# Patient Record
Sex: Female | Born: 1990
Health system: Southern US, Community
[De-identification: ages and names within clinical notes are randomized; demographics above are authoritative.]

## PROBLEM LIST (undated history)

## (undated) DIAGNOSIS — F329 Major depressive disorder, single episode, unspecified: Secondary | ICD-10-CM

## (undated) DIAGNOSIS — Z5181 Encounter for therapeutic drug level monitoring: Secondary | ICD-10-CM

## (undated) DIAGNOSIS — F32A Depression, unspecified: Secondary | ICD-10-CM

## (undated) DIAGNOSIS — Z8619 Personal history of other infectious and parasitic diseases: Secondary | ICD-10-CM

## (undated) DIAGNOSIS — I5021 Acute systolic (congestive) heart failure: Secondary | ICD-10-CM

## (undated) DIAGNOSIS — R87629 Unspecified abnormal cytological findings in specimens from vagina: Secondary | ICD-10-CM

## (undated) DIAGNOSIS — F419 Anxiety disorder, unspecified: Secondary | ICD-10-CM

## (undated) DIAGNOSIS — I10 Essential (primary) hypertension: Secondary | ICD-10-CM

## (undated) DIAGNOSIS — J189 Pneumonia, unspecified organism: Secondary | ICD-10-CM

## (undated) DIAGNOSIS — I509 Heart failure, unspecified: Secondary | ICD-10-CM

## (undated) DIAGNOSIS — J45909 Unspecified asthma, uncomplicated: Secondary | ICD-10-CM

## (undated) DIAGNOSIS — F319 Bipolar disorder, unspecified: Secondary | ICD-10-CM

## (undated) DIAGNOSIS — I236 Thrombosis of atrium, auricular appendage, and ventricle as current complications following acute myocardial infarction: Secondary | ICD-10-CM

## (undated) DIAGNOSIS — Z8659 Personal history of other mental and behavioral disorders: Secondary | ICD-10-CM

## (undated) DIAGNOSIS — Z7901 Long term (current) use of anticoagulants: Secondary | ICD-10-CM

## (undated) DIAGNOSIS — I2542 Coronary artery dissection: Secondary | ICD-10-CM

## (undated) HISTORY — DX: Personal history of other mental and behavioral disorders: Z86.59

## (undated) HISTORY — DX: Personal history of other infectious and parasitic diseases: Z86.19

## (undated) HISTORY — DX: Essential (primary) hypertension: I10

---
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
## 2008-04-22 ENCOUNTER — Emergency Department (HOSPITAL_COMMUNITY): Admission: EM | Admit: 2008-04-22 | Discharge: 2008-04-23 | Payer: Self-pay | Admitting: Emergency Medicine

## 2010-04-07 ENCOUNTER — Ambulatory Visit (HOSPITAL_COMMUNITY): Admission: RE | Admit: 2010-04-07 | Discharge: 2010-04-07 | Payer: Self-pay | Admitting: Obstetrics and Gynecology

## 2010-06-22 ENCOUNTER — Inpatient Hospital Stay (HOSPITAL_COMMUNITY)
Admission: AD | Admit: 2010-06-22 | Discharge: 2010-06-25 | Payer: Self-pay | Source: Home / Self Care | Attending: Obstetrics and Gynecology | Admitting: Obstetrics and Gynecology

## 2010-09-14 LAB — COMPREHENSIVE METABOLIC PANEL
AST: 25 U/L (ref 0–37)
Albumin: 3.4 g/dL — ABNORMAL LOW (ref 3.5–5.2)
Alkaline Phosphatase: 113 U/L (ref 39–117)
BUN: 4 mg/dL — ABNORMAL LOW (ref 6–23)
BUN: 6 mg/dL (ref 6–23)
CO2: 23 mEq/L (ref 19–32)
Calcium: 8.3 mg/dL — ABNORMAL LOW (ref 8.4–10.5)
Creatinine, Ser: 0.72 mg/dL (ref 0.4–1.2)
GFR calc Af Amer: >60 mL/min (ref >60)
GFR calc non Af Amer: >60 mL/min (ref >60)
Glucose, Bld: 81 mg/dL (ref 70–99)
Potassium: 4.1 mEq/L (ref 3.5–5.1)
Total Protein: 6.3 g/dL (ref 6.0–8.3)

## 2010-09-14 LAB — LACTATE DEHYDROGENASE: LDH: 178 U/L (ref 94–250)

## 2010-09-14 LAB — CBC
HCT: 31.2 % — ABNORMAL LOW (ref 36.0–46.0)
HCT: 38.1 % (ref 36.0–46.0)
Hemoglobin: 10.7 g/dL — ABNORMAL LOW (ref 12.0–15.0)
MCH: 30.1 pg (ref 26.0–34.0)
MCHC: 34.3 g/dL (ref 30.0–36.0)
MCV: 85.8 fL (ref 78.0–100.0)
MCV: 87 fL (ref 78.0–100.0)
Platelets: 230 10*3/uL (ref 150–400)
RBC: 4.38 MIL/uL (ref 3.87–5.11)
RDW: 13 % (ref 11.5–15.5)
RDW: 13.3 % (ref 11.5–15.5)
WBC: 6.4 10*3/uL (ref 4.0–10.5)
WBC: 7.3 10*3/uL (ref 4.0–10.5)

## 2010-09-14 LAB — URIC ACID: Uric Acid, Serum: 6.5 mg/dL (ref 2.4–7.0)

## 2010-09-25 ENCOUNTER — Encounter: Payer: Self-pay | Admitting: Hematology and Oncology

## 2012-08-03 DIAGNOSIS — J45909 Unspecified asthma, uncomplicated: Secondary | ICD-10-CM | POA: Diagnosis present

## 2012-08-04 ENCOUNTER — Emergency Department (HOSPITAL_COMMUNITY): Admission: EM | Admit: 2012-08-04 | Discharge: 2012-08-04 | Discharge status: Absent without leave | Payer: Self-pay

## 2012-10-02 DIAGNOSIS — F32A Depression, unspecified: Secondary | ICD-10-CM | POA: Diagnosis present

## 2013-03-17 ENCOUNTER — Emergency Department (HOSPITAL_COMMUNITY)
Admission: EM | Admit: 2013-03-17 | Discharge: 2013-03-17 | Payer: PRIVATE HEALTH INSURANCE | Attending: Emergency Medicine | Admitting: Emergency Medicine

## 2013-03-17 ENCOUNTER — Encounter (HOSPITAL_COMMUNITY): Payer: Self-pay

## 2013-03-17 DIAGNOSIS — J45909 Unspecified asthma, uncomplicated: Secondary | ICD-10-CM | POA: Insufficient documentation

## 2013-03-17 DIAGNOSIS — R45851 Suicidal ideations: Secondary | ICD-10-CM

## 2013-03-17 DIAGNOSIS — Z3202 Encounter for pregnancy test, result negative: Secondary | ICD-10-CM | POA: Insufficient documentation

## 2013-03-17 DIAGNOSIS — Z79899 Other long term (current) drug therapy: Secondary | ICD-10-CM | POA: Insufficient documentation

## 2013-03-17 HISTORY — DX: Unspecified asthma, uncomplicated: J45.909

## 2013-03-17 LAB — POCT I-STAT, CHEM 8
Calcium, Ion: 1.2 mmol/L (ref 1.12–1.23)
Chloride: 105 mEq/L (ref 96–112)
HCT: 48 % — ABNORMAL HIGH (ref 36.0–46.0)
Hemoglobin: 16.3 g/dL — ABNORMAL HIGH (ref 12.0–15.0)
TCO2: 24 mmol/L (ref 0–100)

## 2013-03-17 LAB — CBC WITH DIFFERENTIAL/PLATELET
Basophils Absolute: 0 10*3/uL (ref 0.0–0.1)
Basophils Relative: 0 % (ref 0–1)
Eosinophils Absolute: 0.1 10*3/uL (ref 0.0–0.7)
Eosinophils Relative: 1 % (ref 0–5)
HCT: 42.3 % (ref 36.0–46.0)
Hemoglobin: 15.5 g/dL — ABNORMAL HIGH (ref 12.0–15.0)
Lymphocytes Relative: 23 % (ref 12–46)
Lymphs Abs: 1.5 10*3/uL (ref 0.7–4.0)
MCH: 31.1 pg (ref 26.0–34.0)
MCHC: 36.6 g/dL — ABNORMAL HIGH (ref 30.0–36.0)
MCV: 84.9 fL (ref 78.0–100.0)
Monocytes Absolute: 0.3 10*3/uL (ref 0.1–1.0)
Monocytes Relative: 5 % (ref 3–12)
Neutro Abs: 4.7 10*3/uL (ref 1.7–7.7)
Neutrophils Relative %: 71 % (ref 43–77)
Platelets: 282 10*3/uL (ref 150–400)
RBC: 4.98 MIL/uL (ref 3.87–5.11)
RDW: 12 % (ref 11.5–15.5)
WBC: 6.6 10*3/uL (ref 4.0–10.5)

## 2013-03-17 LAB — URINALYSIS, ROUTINE W REFLEX MICROSCOPIC
Ketones, ur: >80 mg/dL — AB
Nitrite: NEGATIVE
pH: 6.5 (ref 5.0–8.0)

## 2013-03-17 LAB — URINE MICROSCOPIC-ADD ON

## 2013-03-17 LAB — RAPID URINE DRUG SCREEN, HOSP PERFORMED
Amphetamines: NOT DETECTED
Barbiturates: NOT DETECTED
Benzodiazepines: NOT DETECTED
Cocaine: NOT DETECTED
Opiates: NOT DETECTED
Tetrahydrocannabinol: NOT DETECTED

## 2013-03-17 LAB — POCT PREGNANCY, URINE: Preg Test, Ur: NEGATIVE

## 2013-03-17 LAB — ETHANOL: Alcohol, Ethyl (B): <11 mg/dL (ref 0–11)

## 2013-03-17 NOTE — ED Notes (Addendum)
Per patient she had some "very life changing news" yesterday and she went to [facility] where she was told to stay the night. She is experienced an "anxiety attack" earlier due to where her daughter is having to stay while she is at [facility]. Patient denies pain at this time.

## 2013-03-17 NOTE — ED Notes (Signed)
Pt refusing to take d/c papers, states that she was told that she would get to "talk to someone on the TV" about going home. Erin,PA notified and at bedside talking to pt.

## 2013-03-17 NOTE — ED Notes (Addendum)
Per EMS patient is from [facility], EMS was called by nurse for patient stating that she was having breathing difficulty. EMS assessed patient to be clear in all lung fields, 100% O2 saturation. Noted contraction of hands. Patient experiencing numbness around the lips and face. GCS 15.

## 2013-03-17 NOTE — ED Provider Notes (Signed)
Medical screening examination/treatment/procedure(s) were performed by non-physician practitioner and as supervising physician I was immediately available for consultation/collaboration.   Glynn Octave, MD 03/17/13 [phone]

## 2013-03-17 NOTE — ED Provider Notes (Signed)
CSN: [id number]     Arrival date & time 03/17/13  1238 History   First MD Initiated Contact with Patient 03/17/13 1244     Chief Complaint  Patient presents with  . Anxiety   (Consider location/radiation/quality/duration/timing/severity/associated sxs/prior Treatment) HPI Comments: Patient here from [facility] for medical clearance.  She already has IVC papers.  She states that she has been seeing a therapist on and off for the past 4 years since her mother died.  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  She states that she has felt persecuted by this person and on Friday was told that she would be moved to a different school.  She states that this left her frustrated and confused so last night she showed up at [facility], "just to talk with someone".  She states that during this conversation she did mention the frustration made her want "to shoot herself", but that she was only using this as a figure of speech and that she would never actually do anything.  She reports no previous suicide attempts, states that she has never been on any psychiatric medication and that this thing has been blown way out of proportion.  She adamantly denies suicidal or homicidal ideation at this time.  She reports no other medical history.  She does mention that her PCP once after her mother died had tried to place her on Prozac but she states that she took it for only 3 days then stopped.  Patient is a 22 y.o. female presenting with anxiety. The history is provided by the patient and medical records. No language interpreter was used.  Anxiety This is a new problem. The current episode started in the past 7 days. The problem occurs constantly. The problem has been resolved. Pertinent negatives include no abdominal pain, anorexia, arthralgias, chest pain, chills, coughing, diaphoresis, fatigue, fever, headaches, myalgias, nausea, numbness, rash, sore throat,  urinary symptoms, visual change, vomiting or weakness. Nothing aggravates the symptoms. She has tried nothing for the symptoms. The treatment provided no relief.    Past Medical History  Diagnosis Date  . Asthma    History reviewed. No pertinent past surgical history. No family history on file. History  Substance Use Topics  . Smoking status: Never Smoker   . Smokeless tobacco: Not on file  . Alcohol Use: No   OB History   Grav Para Term Preterm Abortions TAB SAB Ect Mult Living                 Review of Systems  Constitutional: Negative for fever, chills, diaphoresis and fatigue.  HENT: Negative for sore throat.   Respiratory: Negative for cough.   Cardiovascular: Negative for chest pain.  Gastrointestinal: Negative for nausea, vomiting, abdominal pain and anorexia.  Musculoskeletal: Negative for myalgias and arthralgias.  Skin: Negative for rash.  Neurological: Negative for weakness, numbness and headaches.  All other systems reviewed and are negative.    Allergies  Gluten meal  Home Medications   Current Outpatient Rx  Name  Route  Sig  Dispense  Refill  . albuterol (PROVENTIL HFA;VENTOLIN HFA) 108 (90 BASE) MCG/ACT inhaler   Inhalation   Inhale 2 puffs into the lungs every 6 (six) hours as needed for wheezing.         . beclomethasone (QVAR) 80 MCG/ACT inhaler   Inhalation   Inhale 1 puff into the lungs 2 (two) times  daily.         . ibuprofen (ADVIL,MOTRIN) 200 MG tablet   Oral   Take 600 mg by mouth every 6 (six) hours as needed for pain.          BP 149/78  Pulse 90  Temp(Src) 98 F (36.7 C) (Oral)  Resp 14  SpO2 100%  LMP 02/26/2013 Physical Exam  Nursing note and vitals reviewed. Constitutional: She is oriented to person, place, and time. She appears well-developed and well-nourished. No distress.  HENT:  Head: Normocephalic and atraumatic.  Right Ear: External ear normal.  Left Ear: External ear normal.  Nose: Nose normal.   Mouth/Throat: Oropharynx is clear and moist. No oropharyngeal exudate.  Eyes: Conjunctivae are normal. Pupils are equal, round, and reactive to light. No scleral icterus.  Neck: Normal range of motion. Neck supple.  Cardiovascular: Normal rate, regular rhythm and normal heart sounds.  Exam reveals no gallop and no friction rub.   No murmur heard. Pulmonary/Chest: Effort normal and breath sounds normal. No respiratory distress. She has no wheezes. She exhibits no tenderness.  Abdominal: Soft. Bowel sounds are normal. There is no tenderness.  Musculoskeletal: Normal range of motion. She exhibits no edema and no tenderness.  Lymphadenopathy:    She has no cervical adenopathy.  Neurological: She is alert and oriented to person, place, and time. No cranial nerve deficit.  Skin: Skin is warm and dry. No rash noted. No erythema. No pallor.  Psychiatric: She has a normal mood and affect. Her speech is normal and behavior is normal. Judgment normal. Thought content is not delusional. Cognition and memory are normal. She expresses no homicidal and no suicidal ideation. She expresses no suicidal plans and no homicidal plans.    ED Course  Procedures (including critical care time) Labs Review Labs Reviewed  URINALYSIS, ROUTINE W REFLEX MICROSCOPIC - Abnormal; Notable for the following:    Color, Urine AMBER (*)    APPearance CLOUDY (*)    Specific Gravity, Urine 1.035 (*)    Ketones, ur >80 (*)    Protein, ur 30 (*)    Leukocytes, UA SMALL (*)    All other components within normal limits  URINE MICROSCOPIC-ADD ON - Abnormal; Notable for the following:    Squamous Epithelial / LPF FEW (*)    All other components within normal limits  URINE RAPID DRUG SCREEN (HOSP PERFORMED)  CBC WITH DIFFERENTIAL  ETHANOL  POCT PREGNANCY, URINE   Results for orders placed during the hospital encounter of 03/17/13  CBC WITH DIFFERENTIAL      Result Value Range   WBC 6.6  4.0 - 10.5 K/uL   RBC 4.98  3.87 -  5.11 MIL/uL   Hemoglobin 15.5 (*) 12.0 - 15.0 g/dL   HCT 29.5  28.4 - 13.2 %   MCV 84.9  78.0 - 100.0 fL   MCH 31.1  26.0 - 34.0 pg   MCHC 36.6 (*) 30.0 - 36.0 g/dL   RDW 44.0  10.2 - 72.5 %   Platelets 282  150 - 400 K/uL   Neutrophils Relative % PENDING  43 - 77 %   Neutro Abs PENDING  1.7 - 7.7 K/uL   Band Neutrophils PENDING  0 - 10 %   Lymphocytes Relative PENDING  12 - 46 %   Lymphs Abs PENDING  0.7 - 4.0 K/uL   Monocytes Relative PENDING  3 - 12 %   Monocytes Absolute PENDING  0.1 - 1.0 K/uL   Eosinophils  Relative PENDING  0 - 5 %   Eosinophils Absolute PENDING  0.0 - 0.7 K/uL   Basophils Relative PENDING  0 - 1 %   Basophils Absolute PENDING  0.0 - 0.1 K/uL   WBC Morphology PENDING     RBC Morphology PENDING     Smear Review PENDING     nRBC PENDING  0 /100 WBC   Metamyelocytes Relative PENDING     Myelocytes PENDING     Promyelocytes Absolute PENDING     Blasts PENDING    URINALYSIS, ROUTINE W REFLEX MICROSCOPIC      Result Value Range   Color, Urine AMBER (*) YELLOW   APPearance CLOUDY (*) CLEAR   Specific Gravity, Urine 1.035 (*) 1.005 - 1.030   pH 6.5  5.0 - 8.0   Glucose, UA NEGATIVE  NEGATIVE mg/dL   Hgb urine dipstick NEGATIVE  NEGATIVE   Bilirubin Urine NEGATIVE  NEGATIVE   Ketones, ur >80 (*) NEGATIVE mg/dL   Protein, ur 30 (*) NEGATIVE mg/dL   Urobilinogen, UA 1.0  0.0 - 1.0 mg/dL   Nitrite NEGATIVE  NEGATIVE   Leukocytes, UA SMALL (*) NEGATIVE  ETHANOL      Result Value Range   Alcohol, Ethyl (B) <11  0 - 11 mg/dL  URINE RAPID DRUG SCREEN (HOSP PERFORMED)      Result Value Range   Opiates NONE DETECTED  NONE DETECTED   Cocaine NONE DETECTED  NONE DETECTED   Benzodiazepines NONE DETECTED  NONE DETECTED   Amphetamines NONE DETECTED  NONE DETECTED   Tetrahydrocannabinol NONE DETECTED  NONE DETECTED   Barbiturates NONE DETECTED  NONE DETECTED  URINE MICROSCOPIC-ADD ON      Result Value Range   Squamous Epithelial / LPF FEW (*) RARE   WBC, UA 7-10   <3 WBC/hpf   Urine-Other MUCOUS PRESENT    POCT PREGNANCY, URINE      Result Value Range   Preg Test, Ur NEGATIVE  NEGATIVE  POCT I-STAT, CHEM 8      Result Value Range   Sodium 142  135 - 145 mEq/L   Potassium 3.5  3.5 - 5.1 mEq/L   Chloride 105  96 - 112 mEq/L   BUN 16  6 - 23 mg/dL   Creatinine, Ser 4.09  0.50 - 1.10 mg/dL   Glucose, Bld 75  70 - 99 mg/dL   Calcium, Ion 8.11  1.12 - 1.23 mmol/L   TCO2 24  0 - 100 mmol/L   Hemoglobin 16.3 (*) 12.0 - 15.0 g/dL   HCT 91.4 (*) 78.2 - 95.6 %   No results found.   Imaging Review No results found.  MDM  IVC - for suicidal ideation  Patient here with 72 hour hold for medical clearance from [facility], though based on the patient's story I feel this is a light admit there, we will return her to [facility] as she has been medically cleared.     Izola Price Marisue Humble, [place] 03/17/13 1533

## 2013-03-17 NOTE — ED Notes (Signed)
Bed: WU13 Expected date: 03/17/13 Expected time: 12:33 PM Means of arrival: Ambulance Comments: Pt from [facility] Asthma vs anxiety to return to [facility]

## 2013-10-16 DIAGNOSIS — Z8659 Personal history of other mental and behavioral disorders: Secondary | ICD-10-CM

## 2014-07-05 ENCOUNTER — Encounter (HOSPITAL_COMMUNITY): Payer: Self-pay | Admitting: Emergency Medicine

## 2014-07-05 ENCOUNTER — Emergency Department (HOSPITAL_COMMUNITY)
Admission: EM | Admit: 2014-07-05 | Discharge: 2014-07-06 | Disposition: A | Discharge status: Home / Self Care | Payer: PRIVATE HEALTH INSURANCE | Attending: Emergency Medicine | Admitting: Emergency Medicine

## 2014-07-05 DIAGNOSIS — F32A Depression, unspecified: Secondary | ICD-10-CM

## 2014-07-05 DIAGNOSIS — J45909 Unspecified asthma, uncomplicated: Secondary | ICD-10-CM | POA: Insufficient documentation

## 2014-07-05 DIAGNOSIS — Z79899 Other long term (current) drug therapy: Secondary | ICD-10-CM | POA: Insufficient documentation

## 2014-07-05 DIAGNOSIS — F329 Major depressive disorder, single episode, unspecified: Secondary | ICD-10-CM | POA: Insufficient documentation

## 2014-07-05 DIAGNOSIS — Z7951 Long term (current) use of inhaled steroids: Secondary | ICD-10-CM | POA: Insufficient documentation

## 2014-07-05 HISTORY — DX: Bipolar disorder, unspecified: F31.9

## 2014-07-05 NOTE — ED Notes (Signed)
Pt reports that she has a hx of Bipolar disorder, has been non-complaint with medication since losing insurance coverage stating "I only take it when I think I really need it." Pt denies SI/HI, AVH, loss of appetite or any other concerns. Pt makes poor eye contact, but is otherwise appropriate. Pt states that she lives with her 26yo daughter and does not have trouble taking care of her d/t depression with family and friends as support. Pt calm and cooperative in triage.

## 2014-07-05 NOTE — ED Provider Notes (Signed)
CSN: [id number]     Arrival date & time 07/05/14  2150 History  This chart was scribed for Elpidio Anis, PA-C with Hanley Seamen, MD by Tonye Royalty, ED Scribe. This patient was seen in room WTR4/WLPT4 and the patient's care was started at 11:46 PM.    Chief Complaint  Patient presents with  . Depression   The history is provided by the patient. No language interpreter was used.    HPI Comments: Sheila Estes is a 24 y.o. female with history of bipolar disorder who presents to the Emergency Department complaining of depression worse 3 days ago, though she states she is "always up and down." She states she decided to come in because someone told her she needs to, and she states she feels somewhat like she does. She states she takes Prozac and Abilify prescribed by her psychiatrist and Xanax prescribed by her PCP. She states she is not compliant with Prozac and takes it usually every other day. She denies SI or HI at this time, but reports SI at times while at home. She states she is not afraid to be at home. She states she and her 14 year old daughter live alone. She states she is able to care for her daughter. She states she was told she needs testing for borderline personality disorder by a family friend who has a psychology background but is not a clinical psychiatrist. She notes she recently lost her insurance and is trying to get on Medicaid. She denies drug abuse problems. She denies smoking. She denies hallucinations.  Past Medical History  Diagnosis Date  . Asthma   . Bipolar 1 disorder    History reviewed. No pertinent past surgical history. History reviewed. No pertinent family history. History  Substance Use Topics  . Smoking status: Never Smoker   . Smokeless tobacco: Never Used  . Alcohol Use: Yes     Comment: socially   OB History    No data available     Review of Systems  Gastrointestinal: Negative for abdominal pain.  Neurological: Negative for headaches.   Psychiatric/Behavioral: Negative for suicidal ideas and hallucinations.      Allergies  Gluten meal  Home Medications   Prior to Admission medications   Medication Sig Start Date End Date Taking? Authorizing Provider  albuterol (PROVENTIL HFA;VENTOLIN HFA) 108 (90 BASE) MCG/ACT inhaler Inhale 2-4 puffs into the lungs every 6 (six) hours as needed for wheezing (wheezing).    Yes Historical Provider, MD  ALPRAZolam Prudy Feeler) 0.5 MG tablet Take 0.5 mg by mouth at bedtime as needed for anxiety (anxiety).   Yes Historical Provider, MD  ARIPiprazole (ABILIFY) 2 MG tablet Take 2 mg by mouth daily.   Yes Historical Provider, MD  beclomethasone (QVAR) 80 MCG/ACT inhaler Inhale 1 puff into the lungs 2 (two) times daily.   Yes Historical Provider, MD  FLUoxetine (PROZAC) 20 MG tablet Take 20 mg by mouth every other day.   Yes Historical Provider, MD  ibuprofen (ADVIL,MOTRIN) 200 MG tablet Take 600 mg by mouth every 6 (six) hours as needed for headache (headache).    Yes Historical Provider, MD   BP 157/83 mmHg  Pulse 77  Temp(Src) 97.9 F (36.6 C) (Oral)  Resp 16  Ht  (1.575 m)  Wt 195 lb (88.451 kg)  BMI 35.66 kg/m2  SpO2 99%  LMP 06/21/2014 (Approximate) Physical Exam  Constitutional: She is oriented to person, place, and time. She appears well-developed and well-nourished.  HENT:  Head:  Normocephalic and atraumatic.  Eyes: Conjunctivae are normal.  Neck: Normal range of motion. Neck supple.  Cardiovascular: Normal rate, regular rhythm and normal heart sounds.   No murmur heard. Pulmonary/Chest: Effort normal and breath sounds normal. No respiratory distress. She has no wheezes. She has no rales.  Abdominal: Soft. There is no tenderness.  Musculoskeletal: Normal range of motion.  Neurological: She is alert and oriented to person, place, and time.  Skin: Skin is warm and dry.  Psychiatric: She has a normal mood and affect.  Nursing note and vitals reviewed.   ED Course   Procedures (including critical care time)  DIAGNOSTIC STUDIES: Oxygen Saturation is 99% on room air, normal by my interpretation.    COORDINATION OF CARE: 11:55 PM Discussed treatment plan with patient at beside, the patient agrees with the plan and has no further questions at this time.   Labs Review Labs Reviewed - No data to display  Imaging Review No results found.   EKG Interpretation None      MDM   Final diagnoses:  None    1. Depression 2. Fleeting SI  TTS consultation is concerned for "soft" suicidal ideation. Will medically clear and place in psych for further evaluation.  1:50: per Guadalupe Dawn with TTS, the patient is found stable for discharge home, not a threat to herself and is given resources for outpatient follow up for further management of depressive symptoms.   I personally performed the services described in this documentation, which was scribed in my presence. The recorded information has been reviewed and is accurate.     Arnoldo Hooker, PA-C 07/06/14 0044  Arnoldo Hooker, PA-C 07/06/14 0155  Hanley Seamen, MD 07/06/14 [phone]

## 2014-07-06 LAB — ETHANOL

## 2014-07-06 LAB — RAPID URINE DRUG SCREEN, HOSP PERFORMED
Amphetamines: NOT DETECTED
Barbiturates: NOT DETECTED
Benzodiazepines: NOT DETECTED
COCAINE: NOT DETECTED
Opiates: NOT DETECTED
Tetrahydrocannabinol: NOT DETECTED

## 2014-07-06 LAB — CBC WITH DIFFERENTIAL/PLATELET
BASOS ABS: 0 10*3/uL (ref 0.0–0.1)
Basophils Relative: 0 % (ref 0–1)
EOS ABS: 0.4 10*3/uL (ref 0.0–0.7)
Eosinophils Relative: 6 % — ABNORMAL HIGH (ref 0–5)
HEMATOCRIT: 41 % (ref 36.0–46.0)
HEMOGLOBIN: 14.3 g/dL (ref 12.0–15.0)
LYMPHS ABS: 3 10*3/uL (ref 0.7–4.0)
Lymphocytes Relative: 53 % — ABNORMAL HIGH (ref 12–46)
MCH: 31 pg (ref 26.0–34.0)
MCHC: 34.9 g/dL (ref 30.0–36.0)
MCV: 88.7 fL (ref 78.0–100.0)
MONOS PCT: 5 % (ref 3–12)
Monocytes Absolute: 0.3 10*3/uL (ref 0.1–1.0)
Neutro Abs: 2 10*3/uL (ref 1.7–7.7)
Neutrophils Relative %: 36 % — ABNORMAL LOW (ref 43–77)
Platelets: 310 10*3/uL (ref 150–400)
RBC: 4.62 MIL/uL (ref 3.87–5.11)
RDW: 12.4 % (ref 11.5–15.5)
WBC: 5.6 10*3/uL (ref 4.0–10.5)

## 2014-07-06 LAB — COMPREHENSIVE METABOLIC PANEL
ALT: 16 U/L (ref 0–35)
ANION GAP: 4 — AB (ref 5–15)
AST: 21 U/L (ref 0–37)
Albumin: 4.1 g/dL (ref 3.5–5.2)
Alkaline Phosphatase: 50 U/L (ref 39–117)
BUN: 12 mg/dL (ref 6–23)
CALCIUM: 9.1 mg/dL (ref 8.4–10.5)
CHLORIDE: 107 meq/L (ref 96–112)
CO2: 27 mmol/L (ref 19–32)
CREATININE: 0.63 mg/dL (ref 0.50–1.10)
GFR calc non Af Amer: >90 mL/min (ref >90)
GLUCOSE: 125 mg/dL — AB (ref 70–99)
POTASSIUM: 3.8 mmol/L (ref 3.5–5.1)
SODIUM: 138 mmol/L (ref 135–145)
Total Bilirubin: 0.7 mg/dL (ref 0.3–1.2)
Total Protein: 7.6 g/dL (ref 6.0–8.3)

## 2014-07-06 MED ORDER — ACETAMINOPHEN 325 MG PO TABS: 650.0000 mg | ORAL_TABLET | ORAL | Status: DC | PRN

## 2014-07-06 MED ORDER — ONDANSETRON HCL 4 MG PO TABS: 4.0000 mg | ORAL_TABLET | Freq: Three times a day (TID) | ORAL | Status: DC | PRN

## 2014-07-06 NOTE — BH Assessment (Signed)
Consulted Maryjean Morn, PA-C who reported that pt does not meet admission criteria and should be discharged with outpatient resources. Elpidio Anis, PA-C has been informed of the recommendation. TTS counselor will provide pt with a list of outpatient resources.

## 2014-07-06 NOTE — ED Notes (Signed)
Pt alert x 4, v/s stable discharge instructions given with teach back. Belongings given back to Pt, d/c home will continue to monitor. Estill Dooms, RN 2:17 AM 07/06/2014

## 2014-07-06 NOTE — BH Assessment (Addendum)
Tele Assessment Note   Sheila Estes is an 24 y.o. female presenting to [facility] ED reporting increasing depression. Pt stated "I have a history of bipolar and allegedly borderline personality disorder". Pt reported that a family friend that has her bachelors in psychology informed her that she has borderline personality disorder. Pt denies SI at this time but stated "I always want something to kill me but never myself". "I always wish that something would just happen to me like this truck could just hit me". "I want something to catch me off guard". "I usually have these thought everyday". Pt did not report any psychiatric hospitalizations but shared that she was diagnosed with bipolar in the past.  Pt shared that she has been taking her medication inconsistently. Pt did not report any current mental health treatment. Pt shared that she followed up a psychiatrist after she was assess at [facility] but was unable to follow through. Pt also shared that she is dealing with multiple stressors such as CPS report with "false accusations", job stress and financial problems. Pt is reporting multiple depressive symptoms. Pt denied having access to weapons or firearms. Pt did not report any pending criminal charges or upcoming court dates. Pt did not report any history of physical, sexual or emotional abuse.  Pt is alert and oriented x3. Pt is dressed casual. Pt is calm and cooperative at this time. PT maintained poor eye contact throughout this assessment. Pt speech is normal but soft at times. PT mood is depressed and affect is congruent with mood. Pt shared that her concentration has decreased and stated "I have short term memory loss". It is recommended that pt be discharged with outpatient resources. Pt is in agreement with the discharge recommendation and has agreed to follow up with an outpatient provider.  Axis I: Bipolar, Depressed  Past Medical History:  Past Medical History  Diagnosis Date  . Asthma   . Bipolar 1  disorder     History reviewed. No pertinent past surgical history.  Family History: History reviewed. No pertinent family history.  Social History:  reports that she has never smoked. She has never used smokeless tobacco. She reports that she drinks alcohol. She reports that she does not use illicit drugs.  Additional Social History:  Alcohol / Drug Use History of alcohol / drug use?: No history of alcohol / drug abuse  CIWA: CIWA-Ar BP: 157/83 mmHg Pulse Rate: 77 COWS:    PATIENT STRENGTHS: (choose at least two) Average or above average intelligence Work skills  Allergies:  Allergies  Allergen Reactions  . Gluten Meal     Gluten makes her wheeze    Home Medications:  (Not in a hospital admission)  OB/GYN Status:  Patient's last menstrual period was 06/21/2014 (approximate).  General Assessment Data Location of Assessment: [facility] ED Is this a Tele or Face-to-Face Assessment?: Face-to-Face Is this an Initial Assessment or a Re-assessment for this encounter?: Initial Assessment Living Arrangements: Children Can pt return to current living arrangement?: Yes Admission Status: Voluntary Is patient capable of signing voluntary admission?: Yes Transfer from: Home Referral Source: Self/Family/Friend     [facility] Crisis Care Plan Living Arrangements: Children Name of Psychiatrist: No provider reported at this time.  Name of Therapist: No provider reported at this time.   Education Status Is patient currently in school?: No  Risk to self with the past 6 months Suicidal Ideation: No-Not Currently/Within Last 6 Months ("I always want something to kill me but I wouldn't do it) Suicidal  Intent: No Is patient at risk for suicide?: No Suicidal Plan?: No Access to Means: No What has been your use of drugs/alcohol within the last 12 months?: No alcohol or drug abuse reported.  Previous Attempts/Gestures: No How many times?: 0 Other Self Harm Risks: No other self harm risk  identified at this time.  Triggers for Past Attempts: None known Intentional Self Injurious Behavior: None Family Suicide History: No Recent stressful life event(s): Financial Problems, Other (Comment) (CPS case, job stress) Persecutory voices/beliefs?: No Depression: Yes Depression Symptoms: Despondent, Tearfulness, Fatigue, Loss of interest in usual pleasures, Feeling angry/irritable, Feeling worthless/self pity Substance abuse history and/or treatment for substance abuse?: No Suicide prevention information given to non-admitted patients: Not applicable  Risk to Others within the past 6 months Homicidal Ideation: No Thoughts of Harm to Others: No Current Homicidal Intent: No Current Homicidal Plan: No Access to Homicidal Means: No Identified Victim: NA History of harm to others?: No Assessment of Violence: On admission Violent Behavior Description: No violent behaviors observed. Pt is calm and cooperative at this time.  Does patient have access to weapons?: No Criminal Charges Pending?: No Does patient have a court date: No  Psychosis Hallucinations: None noted Delusions: None noted  Mental Status Report Appear/Hygiene: Unremarkable Eye Contact: Poor Motor Activity: Freedom of movement Speech: Soft, Logical/coherent Level of Consciousness: Quiet/awake Mood: Depressed Affect: Appropriate to circumstance Anxiety Level: None Thought Processes: Coherent, Relevant Judgement: Unimpaired Orientation: Person, Place, Situation, Time Obsessive Compulsive Thoughts/Behaviors: None  Cognitive Functioning Concentration: Fair Memory: Remote Intact, Recent Intact IQ: Average Insight: Fair Impulse Control: Good Appetite: Fair Weight Loss: 0 Weight Gain: 0 Sleep: No Change Total Hours of Sleep: 8 Vegetative Symptoms: Staying in bed  ADLScreening [facility] Assessment Services) Patient's cognitive ability adequate to safely complete daily activities?: Yes Patient able to express  need for assistance with ADLs?: Yes Independently performs ADLs?: Yes (appropriate for developmental age)  Prior Inpatient Therapy Prior Inpatient Therapy: No  Prior Outpatient Therapy Prior Outpatient Therapy: Yes Prior Therapy Dates: 2015 Prior Therapy Facilty/Provider(s): [facility] Reason for Treatment: Depression/Bipolar   ADL Screening (condition at time of admission) Patient's cognitive ability adequate to safely complete daily activities?: Yes Is the patient deaf or have difficulty hearing?: No Does the patient have difficulty seeing, even when wearing glasses/contacts?: No Does the patient have difficulty concentrating, remembering, or making decisions?: No Patient able to express need for assistance with ADLs?: Yes Does the patient have difficulty dressing or bathing?: No Independently performs ADLs?: Yes (appropriate for developmental age)       Abuse/Neglect Assessment (Assessment to be complete while patient is alone) Physical Abuse: Denies Verbal Abuse: Denies Sexual Abuse: Denies Exploitation of patient/patient's resources: Denies Self-Neglect: Denies     Merchant navy officer (For Healthcare) Does patient have an advance directive?: No Would patient like information on creating an advanced directive?: No - patient declined information    Additional Information 1:1 In Past 12 Months?: No CIRT Risk: No Elopement Risk: No     Disposition: Outpatient treatment. Pt has been provided with a list of outpatient resources.  Disposition Initial Assessment Completed for this Encounter: Yes  Lovey Crupi S 07/06/2014 12:50 AM

## 2014-07-06 NOTE — Discharge Instructions (Signed)

## 2014-07-06 NOTE — ED Notes (Addendum)
Pt alert x4, just admitted to the TCU. Order given to d/c the Pt at this time home v/s stable will continue to monitor. Estill Dooms, RN 2:02 AM 07/06/2014

## 2015-04-15 ENCOUNTER — Emergency Department (HOSPITAL_COMMUNITY)
Admission: EM | Admit: 2015-04-15 | Discharge: 2015-04-15 | Disposition: A | Discharge status: Home / Self Care | Payer: PRIVATE HEALTH INSURANCE | Attending: Emergency Medicine | Admitting: Emergency Medicine

## 2015-04-15 ENCOUNTER — Encounter (HOSPITAL_COMMUNITY): Payer: Self-pay

## 2015-04-15 DIAGNOSIS — F319 Bipolar disorder, unspecified: Secondary | ICD-10-CM | POA: Insufficient documentation

## 2015-04-15 DIAGNOSIS — Z79899 Other long term (current) drug therapy: Secondary | ICD-10-CM | POA: Insufficient documentation

## 2015-04-15 DIAGNOSIS — J45901 Unspecified asthma with (acute) exacerbation: Secondary | ICD-10-CM | POA: Insufficient documentation

## 2015-04-15 MED ORDER — ALBUTEROL SULFATE HFA 108 (90 BASE) MCG/ACT IN AERS: 1.0000 | INHALATION_SPRAY | Freq: Four times a day (QID) | RESPIRATORY_TRACT | Status: DC | PRN

## 2015-04-15 MED ORDER — ALBUTEROL SULFATE (2.5 MG/3ML) 0.083% IN NEBU
2.5000 mg | INHALATION_SOLUTION | Freq: Once | RESPIRATORY_TRACT | Status: AC
  Administered 2015-04-15: 2.5 mg via RESPIRATORY_TRACT
  Filled 2015-04-15: qty 3

## 2015-04-15 MED ORDER — ALBUTEROL SULFATE (2.5 MG/3ML) 0.083% IN NEBU
5.0000 mg | INHALATION_SOLUTION | Freq: Once | RESPIRATORY_TRACT | Status: AC
  Administered 2015-04-15: 5 mg via RESPIRATORY_TRACT
  Filled 2015-04-15: qty 6

## 2015-04-15 MED ORDER — PREDNISONE 20 MG PO TABS
60.0000 mg | ORAL_TABLET | Freq: Once | ORAL | Status: AC
  Administered 2015-04-15: 60 mg via ORAL
  Filled 2015-04-15: qty 3

## 2015-04-15 MED ORDER — LORATADINE 10 MG PO TABS
10.0000 mg | ORAL_TABLET | Freq: Once | ORAL | Status: AC
  Administered 2015-04-15: 10 mg via ORAL
  Filled 2015-04-15: qty 1

## 2015-04-15 MED ORDER — PREDNISONE 20 MG PO TABS: ORAL_TABLET | ORAL | Status: DC

## 2015-04-15 NOTE — Discharge Instructions (Signed)

## 2015-04-15 NOTE — ED Notes (Signed)
Pt complains of being short of breath since 1am, she doesn;t have her inhaler

## 2015-04-15 NOTE — ED Provider Notes (Signed)
CSN: [id number]     Arrival date & time 04/15/15  0353 History   First MD Initiated Contact with Patient 04/15/15 0404     Chief Complaint  Patient presents with  . Asthma     (Consider location/radiation/quality/duration/timing/severity/associated sxs/prior Treatment) Patient is a 24 y.o. female presenting with wheezing. The history is provided by the patient. No language interpreter was used.  Wheezing Severity:  Moderate Severity compared to prior episodes:  Similar Onset quality:  Sudden Timing:  Constant Progression:  Unchanged Chronicity:  Recurrent Context comment:  Cold air Relieved by:  Nothing Worsened by:  Nothing tried Ineffective treatments:  None tried Associated symptoms: no chest pain and no cough     Past Medical History  Diagnosis Date  . Asthma   . Bipolar 1 disorder (HCC)    History reviewed. No pertinent past surgical history. History reviewed. No pertinent family history. Social History  Substance Use Topics  . Smoking status: Never Smoker   . Smokeless tobacco: Never Used  . Alcohol Use: Yes     Comment: socially   OB History    No data available     Review of Systems  Respiratory: Positive for wheezing. Negative for cough.   Cardiovascular: Negative for chest pain, palpitations and leg swelling.  All other systems reviewed and are negative.     Allergies  Other and Gluten meal  Home Medications   Prior to Admission medications   Medication Sig Start Date End Date Taking? Authorizing Provider  buPROPion (WELLBUTRIN XL) 300 MG 24 hr tablet Take 300 mg by mouth daily.   Yes Historical Provider, MD  ePHEDrine-GuaiFENesin (PRIMATENE ASTHMA PO) Take 1 tablet by mouth 2 (two) times daily as needed. For nasal drainage   Yes Historical Provider, MD  lurasidone (LATUDA) 40 MG TABS tablet Take 40 mg by mouth daily with breakfast.   Yes Historical Provider, MD  albuterol (PROVENTIL HFA;VENTOLIN HFA) 108 (90 BASE) MCG/ACT inhaler Inhale 2-4  puffs into the lungs every 6 (six) hours as needed for wheezing (wheezing).     Historical Provider, MD  ibuprofen (ADVIL,MOTRIN) 200 MG tablet Take 600 mg by mouth every 6 (six) hours as needed for headache (headache).     Historical Provider, MD   BP 141/97 mmHg  Pulse 88  Temp(Src) 97.9 F (36.6 C) (Oral)  Resp 20  Ht  (1.549 m)  Wt 205 lb (92.987 kg)  BMI 38.75 kg/m2  SpO2 99%  LMP 04/08/2015 Physical Exam  Constitutional: She is oriented to person, place, and time. She appears well-developed and well-nourished. No distress.  HENT:  Head: Normocephalic and atraumatic.  Mouth/Throat: Oropharynx is clear and moist.  Eyes: Conjunctivae are normal. Pupils are equal, round, and reactive to light.  Neck: Normal range of motion. Neck supple.  Cardiovascular: Normal rate, regular rhythm and intact distal pulses.   Pulmonary/Chest: No stridor. No respiratory distress. She has wheezes. She has no rales. She exhibits no tenderness.  Abdominal: Soft. Bowel sounds are normal. There is no tenderness. There is no rebound and no guarding.  Musculoskeletal: Normal range of motion.  Neurological: She is alert and oriented to person, place, and time.  Skin: Skin is warm and dry.  Psychiatric: She has a normal mood and affect.    ED Course  Procedures (including critical care time) Labs Review Labs Reviewed - No data to display  Imaging Review No results found. I have personally reviewed and evaluated these images and lab results as part of  my medical decision-making.   EKG Interpretation None      MDM   Final diagnoses:  None    Medications  loratadine (CLARITIN) tablet 10 mg (10 mg Oral Given 04/15/15 0432)  albuterol (PROVENTIL) (2.5 MG/3ML) 0.083% nebulizer solution 5 mg (5 mg Nebulization Given 04/15/15 0432)  predniSONE (DELTASONE) tablet 60 mg (60 mg Oral Given 04/15/15 0432)  albuterol (PROVENTIL) (2.5 MG/3ML) 0.083% nebulizer solution 2.5 mg (2.5 mg Nebulization  Given 04/15/15 0543)    Clear and feels better post medications    Elon Lomeli, MD 04/15/15 [phone]

## 2015-07-05 ENCOUNTER — Encounter (HOSPITAL_COMMUNITY): Payer: Self-pay | Admitting: Emergency Medicine

## 2015-07-05 ENCOUNTER — Emergency Department (HOSPITAL_COMMUNITY): Payer: PRIVATE HEALTH INSURANCE

## 2015-07-05 ENCOUNTER — Emergency Department (HOSPITAL_COMMUNITY)
Admission: EM | Admit: 2015-07-05 | Discharge: 2015-07-05 | Disposition: A | Discharge status: Home / Self Care | Payer: PRIVATE HEALTH INSURANCE | Attending: Emergency Medicine | Admitting: Emergency Medicine

## 2015-07-05 DIAGNOSIS — R109 Unspecified abdominal pain: Secondary | ICD-10-CM

## 2015-07-05 DIAGNOSIS — F319 Bipolar disorder, unspecified: Secondary | ICD-10-CM | POA: Insufficient documentation

## 2015-07-05 DIAGNOSIS — Z79899 Other long term (current) drug therapy: Secondary | ICD-10-CM | POA: Insufficient documentation

## 2015-07-05 DIAGNOSIS — J45909 Unspecified asthma, uncomplicated: Secondary | ICD-10-CM | POA: Insufficient documentation

## 2015-07-05 DIAGNOSIS — B9689 Other specified bacterial agents as the cause of diseases classified elsewhere: Secondary | ICD-10-CM

## 2015-07-05 DIAGNOSIS — Z3202 Encounter for pregnancy test, result negative: Secondary | ICD-10-CM | POA: Insufficient documentation

## 2015-07-05 DIAGNOSIS — N760 Acute vaginitis: Secondary | ICD-10-CM | POA: Insufficient documentation

## 2015-07-05 DIAGNOSIS — N939 Abnormal uterine and vaginal bleeding, unspecified: Secondary | ICD-10-CM

## 2015-07-05 LAB — COMPREHENSIVE METABOLIC PANEL
ALK PHOS: 38 U/L (ref 38–126)
ALT: 69 U/L — AB (ref 14–54)
AST: 34 U/L (ref 15–41)
Albumin: 3.7 g/dL (ref 3.5–5.0)
Anion gap: 9 (ref 5–15)
BUN: 9 mg/dL (ref 6–20)
CO2: 25 mmol/L (ref 22–32)
CREATININE: 0.67 mg/dL (ref 0.44–1.00)
Calcium: 8.9 mg/dL (ref 8.9–10.3)
Chloride: 104 mmol/L (ref 101–111)
GFR calc Af Amer: >60 mL/min (ref >60)
GFR calc non Af Amer: >60 mL/min (ref >60)
Glucose, Bld: 81 mg/dL (ref 65–99)
Potassium: 3.4 mmol/L — ABNORMAL LOW (ref 3.5–5.1)
SODIUM: 138 mmol/L (ref 135–145)
Total Bilirubin: 1.1 mg/dL (ref 0.3–1.2)
Total Protein: 7.7 g/dL (ref 6.5–8.1)

## 2015-07-05 LAB — URINALYSIS, ROUTINE W REFLEX MICROSCOPIC
Bilirubin Urine: NEGATIVE
GLUCOSE, UA: NEGATIVE mg/dL
KETONES UR: NEGATIVE mg/dL
LEUKOCYTES UA: NEGATIVE
Nitrite: NEGATIVE
PH: 7 (ref 5.0–8.0)
Protein, ur: NEGATIVE mg/dL
Specific Gravity, Urine: 1.013 (ref 1.005–1.030)

## 2015-07-05 LAB — URINE MICROSCOPIC-ADD ON

## 2015-07-05 LAB — LIPASE, BLOOD: LIPASE: 22 U/L (ref 11–51)

## 2015-07-05 LAB — CBC
HEMATOCRIT: 35.7 % — AB (ref 36.0–46.0)
Hemoglobin: 12.3 g/dL (ref 12.0–15.0)
MCH: 29.9 pg (ref 26.0–34.0)
MCHC: 34.5 g/dL (ref 30.0–36.0)
MCV: 86.9 fL (ref 78.0–100.0)
Platelets: 299 10*3/uL (ref 150–400)
RBC: 4.11 MIL/uL (ref 3.87–5.11)
RDW: 12.2 % (ref 11.5–15.5)
WBC: 8.4 10*3/uL (ref 4.0–10.5)

## 2015-07-05 LAB — I-STAT BETA HCG BLOOD, ED (MC, WL, AP ONLY): I-stat hCG, quantitative: <5 m[IU]/mL (ref <5)

## 2015-07-05 LAB — WET PREP, GENITAL
SPERM: NONE SEEN
TRICH WET PREP: NONE SEEN
YEAST WET PREP: NONE SEEN

## 2015-07-05 LAB — RAPID HIV SCREEN (HIV 1/2 AB+AG)
HIV 1/2 Antibodies: NONREACTIVE
HIV-1 P24 ANTIGEN - HIV24: NONREACTIVE

## 2015-07-05 MED ORDER — IBUPROFEN 800 MG PO TABS
800.0000 mg | ORAL_TABLET | Freq: Once | ORAL | Status: AC
  Administered 2015-07-05: 800 mg via ORAL
  Filled 2015-07-05: qty 1

## 2015-07-05 MED ORDER — IOHEXOL 300 MG/ML  SOLN
25.0000 mL | Freq: Once | INTRAMUSCULAR | Status: AC | PRN
Start: 2015-07-05 — End: 2015-07-05
  Administered 2015-07-05: 25 mL via ORAL

## 2015-07-05 MED ORDER — METRONIDAZOLE 500 MG PO TABS: 500.0000 mg | ORAL_TABLET | Freq: Two times a day (BID) | ORAL | Status: DC

## 2015-07-05 MED ORDER — IOHEXOL 300 MG/ML  SOLN
100.0000 mL | Freq: Once | INTRAMUSCULAR | Status: AC | PRN
  Administered 2015-07-05: 100 mL via INTRAVENOUS

## 2015-07-05 NOTE — Discharge Instructions (Signed)
You have been seen today for vaginal bleeding and flank pain. Your imaging showed no abnormalities. The wet prep showed evidence of bacterial vaginosis. [facility] or with an OBGYN as soon as possible for chronic management of this issue. Follow up with PCP as needed. Return to ED should symptoms worsen. Please take all of your antibiotics until finished!   You may develop abdominal discomfort or diarrhea from the antibiotic.  You may help offset this with probiotics which you can buy or get in yogurt. Do not eat or take the probiotics until 2 hours after your antibiotic.    Emergency Department Resource Guide 1) Find a Doctor and Pay Out of Pocket Although you won't have to find out who is covered by your insurance plan, it is a good idea to ask around and get recommendations. You will then need to call the office and see if the doctor you have chosen will accept you as a new patient and what types of options they offer for patients who are self-pay. Some doctors offer discounts or will set up payment plans for their patients who do not have insurance, but you will need to ask so you aren't surprised when you get to your appointment.  2) Contact Your Local Health Department Not all health departments have doctors that can see patients for sick visits, but many do, so it is worth a call to see if yours does. If you don't know where your local health department is, you can check in your phone book. The CDC also has a tool to help you locate your state's health department, and many state websites also have listings of all of their local health departments.  3) Find a Walk-in Clinic If your illness is not likely to be very severe or complicated, you may want to try a walk in clinic. These are popping up all over the country in pharmacies, drugstores, and shopping centers. They're usually staffed by nurse practitioners or physician assistants that have been trained to treat common  illnesses and complaints. They're usually fairly quick and inexpensive. However, if you have serious medical issues or chronic medical problems, these are probably not your best option.  No Primary Care Doctor: - Call Health Connect at  [phone] - they can help you locate a primary care doctor that  accepts your insurance, provides certain services, etc. - Physician Referral Service- [phone]  Chronic Pain Problems: Organization         Address  Phone   Notes  [facility]  [phone]) [phone] Patients need to be referred by their primary care doctor.   Medication Assistance: Organization         Address  Phone   Notes  [facility] [address] [place], [place] [address] [phone] --Must be a resident of [facility] [place] -- Must have NO insurance coverage whatsoever (no Medicaid/ Medicare, etc.) -- The pt. MUST have a primary care doctor that directs their care regularly and follows them in the community   MedAssist  [phone]   [employer]  [phone]    Agencies that provide inexpensive medical care: Organization         Address  Phone   Notes  [facility]  [phone]) [phone]   [facility]    [phone]) [phone]   [facility] [address] [place], [place] [address] [phone]) [phone]  [facility] [address], [place] [phone]   Planned Parenthood    [phone]   [facility]    [phone]   [place] and [address], [place] Phone:  [phone], Fax:  [phone] Hours of Operation:  9 am - 6 pm, M-F.  Also accepts Medicaid/Medicare and self-pay.  [facility] for [place] [address], [address], [place] Phone: [phone], Fax: [phone]. Hours of Operation:  8:30 am - 5:30 pm, M-F.  Also accepts Medicaid and self-pay.  [facility]  [address], [place] Phone: [phone]   [place], [place], [place] [phone], Ext. [phone] Mondays & Thursdays: 7-9 AM.  First 15 patients are seen on a first come, first serve basis.    [place] Providers:  Organization         Address  Phone   Notes  [facility] [address], [place] [phone] Also accepts self-pay patients.  [facility] [address], [place]  [phone]   [place], [address], [place] [phone]   [facility] [address], [place] [phone]   Lucianne Lei [address], [place]   [phone] Only accepts [place] Access [place] patients after they have their name applied to their card.   Self-Pay (no insurance) in [facility]:  Organization         Address  Phone   Notes  Sickle Cell Patients, [facility] [place] [phone]   [facility] [place] [phone]   [facility] [place]  [place], [place], [place] [phone]   [facility]/Dr. Osei-Bonsu  [address], [place] or [address], [place] [phone] Phone number for both [place] and [place] locations is the same.  [facility] [address] [employer] [phone]   [facility] [address] [place] or [address] [phone]   [facility] [address], [place] [phone], phone; [phone], fax Sees patients 1st and 3rd Saturday of every month.  Must not qualify for public or private insurance (i.e. Medicaid, Medicare, [place] Health Choice, Veterans' Benefits)  Household income should be no more than 200% of the  poverty level The clinic cannot treat you if you are pregnant or think you are pregnant  Sexually transmitted diseases are not treated at the clinic.    Dental Care: Organization         Address  Phone  Notes  [facility] Department of [facility] [phone] Accepts children up to age 19 who are enrolled in [place] or [place]; pregnant women with a Medicaid card; and children who have applied for Medicaid or [place] Health Choice, but were declined, whose parents can pay a reduced fee at time of service.  [facility] Department of [facility]  [address], [place] [phone] Accepts children up to age 56 who are enrolled in [place] or [place]; pregnant women with a Medicaid card; and  children who have applied for Medicaid or [place] Health Choice, but were declined, whose parents can pay a reduced fee at time of service.  [place] Adult [facility] Access PROGRAM  [place] [phone] Patients are seen by appointment only. Walk-ins are not accepted. [place] will see patients 49 years of age and older. Monday - Tuesday (8am-5pm) Most Wednesdays (8:30-5pm) $30 per visit, cash only  [facility] Adult Dental Access PROGRAM  [address], [facility] [phone] Patients are seen by appointment only. Walk-ins are not accepted. [place] will see patients 74 years of age and older. One Wednesday Evening (Monthly: Volunteer Based).  $30 per visit, cash only  [place]  [phone] for adults; Children under age 77, call Graduate Pediatric Dentistry at [phone]. Children aged 60-14, please call [phone] to request a pediatric application.  Dental services are provided in all areas of dental care including fillings, crowns and bridges, complete and partial dentures, implants, gum treatment, root canals, and extractions.  Preventive care is also provided. Treatment is provided to both adults and children. Patients are selected via a lottery and there is often a waiting list.   [facility] [address] [place]  [phone] [email or web address]   [facility] [address] [place], [place] [phone], Ext. [phone] Second and Fourth Thursday of each month, opens at 6:30 AM; Clinic ends at 9 AM.  Patients are seen on a first-come first-served basis, and a limited number are seen during each clinic.   [facility]  [address] [place] D[place]er [place], [place] [phone]   Eligibility Requirements You must have lived in [place], [place], or [place] counties for at least the last three months.   You cannot be eligible for state or federal sponsored [employer], including [employer], [place], or [employer].   You generally cannot be eligible for healthcare insurance through your employer.    How to apply: Eligibility screenings are held every Tuesday and Wednesday afternoon from 1:00 pm until 4:00 pm. You do not need an appointment for the interview!  [facility] [address] [place], [place]   [place]  [place] Department  [place]  [phone]    Behavioral Health Resources in the Community: Intensive Outpatient Programs Organization         Address  Phone  Notes  [place]. [address] [place], [place] [phone]   [facility] Outpatient [address] [place], [place]   ADS: Alcohol & Drug Svcs [address], [place], [place]   [place] [address],  [place], [place] or [phone]   Substance Abuse Resources Organization         Address  Phone  Notes  Alcohol and Drug Services  [phone]   [place]  [phone]   [facility] [place]   Chinita Pester  [phone]   Residential & Outpatient Substance Abuse Program  [phone]   Psychological Services Organization         Address  Phone  Notes  [facility] [place]  [place]  [phone]   [place] [address] [place] or [phone]    Mobile Crisis Teams Organization  Address  Phone  Notes  [facility], Mobile Crisis Care Unit  [phone]   [facility]  [address] [place], [place]   [facility] [address] [place] [phone]    Self-Help/Support Groups Organization         Address  Phone             Notes  [place]. of [place] - variety of support groups  [place] Call for more information  Narcotics Anonymous (NA), [facility] [address], [employer] [place]  2 meetings at this location   Special educational needs teacher         Address  Phone  Notes  ASAP Residential Treatment [place],    [place]  [phone]   [facility]  [address] [place] [address], [place], [place]   [facility] [place], [place] [phone] Admissions: 8am-3pm M-F  Incentives Substance [place] [address]    [place], [place] [phone]   [facility] [address] [place], [place], [place]   [facility] [address]  [place], [place]   Insight Programs - Intensive Outpatient [address], [place], [place]   [facility] ([place].,  [place], [place] [phone] or [phone]   [facility]idential Treatment Services (RTS) [address] [place], [place] Accepts Medicaid  [place] [address]  [place]  [phone] Substance Abuse/Addiction Treatment   [facility] Organization         Address  Phone  Notes  [facility]  [phone]   Domenic Schwab, PhD [address] [place], [place]   [phone] or [phone]   [place], [place] [phone]   [facility] [address] [place], [place] [phone] Insurance/Medicaid/sponsorship through [facility] and Families [address], [address] [place]                                    [place], [place] [phone] [place] [address], [place] [phone]    Dr. Adele Schilder  [phone]   [facility] [place] Dept. 1) [address], [place] 2) [place] 3)  [place] [address], [place] [phone]  [phone]   [place] [phone] or [phone] (After Hours)

## 2015-07-05 NOTE — ED Provider Notes (Signed)
CSN: [id number]     Arrival date & time 07/05/15  1545 History   First MD Initiated Contact with Patient 07/05/15 1606     Chief Complaint  Patient presents with  . Vaginal Bleeding  . Ribcage Pain      (Consider location/radiation/quality/duration/timing/severity/associated sxs/prior Treatment) HPI   Sheila Estes is a 24 y.o. female, with a history of bipolar and asthma, presenting to the ED with heavy vaginal bleeding since 12/26 accompanied by right flank pain. Pt states she is using 1 tampon and 2 pads every hour. Pt has a PCP but has not seen them for this issue.  Pt states this bleeding is timed with her regular period. Pt is concerned not because of the volume of bleeding, but rather because of the length of time that it has been going on. Her normal periods are about 4-5 days. Pt rates the flank pain at 3/10 at rest, goes up to 8/10 with movement, does not change with eating, described as a soreness "like a muscle strain," non-radiating. Pt denies urinary complaints, abdominal pain, pelvic pain, fever/chills, N/V/C/D, shortness of breath, chest pain, dizziness, or any other complaints. Patient is sexually active with one sexual partner.    Past Medical History  Diagnosis Date  . Asthma   . Bipolar 1 disorder (HCC)    History reviewed. No pertinent past surgical history. No family history on file. Social History  Substance Use Topics  . Smoking status: Never Smoker   . Smokeless tobacco: Never Used  . Alcohol Use: Yes     Comment: socially   OB History    No data available     Review of Systems  Constitutional: Negative for fever, chills and diaphoresis.  Respiratory: Negative for shortness of breath.   Cardiovascular: Negative for chest pain.  Gastrointestinal: Negative for nausea, vomiting, abdominal pain, diarrhea and constipation.  Genitourinary: Positive for flank pain, vaginal bleeding and menstrual problem.  Neurological: Negative for dizziness, syncope,  weakness, light-headedness and numbness.  All other systems reviewed and are negative.     Allergies  Other and Gluten meal  Home Medications   Prior to Admission medications   Medication Sig Start Date End Date Taking? Authorizing Provider  buPROPion (WELLBUTRIN XL) 300 MG 24 hr tablet Take 300 mg by mouth daily.   Yes Historical Provider, MD  citalopram (CELEXA) 20 MG tablet Take 20 mg by mouth daily.   Yes Historical Provider, MD  ePHEDrine-GuaiFENesin (PRIMATENE ASTHMA PO) Take 1 tablet by mouth 2 (two) times daily as needed. For nasal drainage   Yes Historical Provider, MD  ibuprofen (ADVIL,MOTRIN) 200 MG tablet Take 600 mg by mouth every 6 (six) hours as needed for headache (headache).    Yes Historical Provider, MD  lurasidone (LATUDA) 40 MG TABS tablet Take 40 mg by mouth daily with breakfast.   Yes Historical Provider, MD  metroNIDAZOLE (FLAGYL) 500 MG tablet Take 1 tablet (500 mg total) by mouth 2 (two) times daily. 07/05/15   Shawn C Joy, PA-C   BP 169/92 mmHg  Pulse 98  Temp(Src) 98.3 F (36.8 C) (Oral)  Resp 18  Ht  (1.549 m)  Wt 89.812 kg  BMI 37.43 kg/m2  SpO2 100%  LMP 06/30/2015 Physical Exam  Constitutional: She appears well-developed and well-nourished. No distress.  HENT:  Head: Normocephalic and atraumatic.  Eyes: Conjunctivae are normal. Pupils are equal, round, and reactive to light.  Cardiovascular: Normal rate, regular rhythm and normal heart sounds.   Pulmonary/Chest: Effort  normal and breath sounds normal. No respiratory distress.  Abdominal: Soft. Bowel sounds are normal. There is tenderness in the right upper quadrant. There is no CVA tenderness.  Genitourinary: Pelvic exam was performed with patient supine. There is bleeding in the vagina.  External genitalia normal Vagina Small amount of blood in vaginal vault. No clots noted. Cervix  normal negative for cervical motion tenderness Adnexa palpated, no masses or negative for tenderness  noted Bladder palpated negative for tenderness Uterus palpated no masses or negative for tenderness Otherwise normal female genitalia. Med Tech, [place], served as Biomedical engineerchaperone during exam.  Musculoskeletal: She exhibits no edema or tenderness.  Neurological: She is alert.  Skin: Skin is warm and dry. She is not diaphoretic.  Nursing note and vitals reviewed.   ED Course  Pelvic exam Date/Time: 07/05/2015 5:24 PM Performed by: Anselm PancoastJOY, SHAWN C Authorized by: Harolyn RutherfordJOY, SHAWN C Consent: Verbal consent obtained. Risks and benefits: risks, benefits and alternatives were discussed Consent given by: patient Patient understanding: patient states understanding of the procedure being performed Patient consent: the patient's understanding of the procedure matches consent given Procedure consent: procedure consent matches procedure scheduled Patient identity confirmed: verbally with patient and arm band Time out: Immediately prior to procedure a "time out" was called to verify the correct patient, procedure, equipment, support staff and site/side marked as required. Local anesthesia used: no Patient sedated: no Patient tolerance: Patient tolerated the procedure well with no immediate complications   (including critical care time) Labs Review Labs Reviewed  WET PREP, GENITAL - Abnormal; Notable for the following:    Clue Cells Wet Prep HPF POC PRESENT (*)    WBC, Wet Prep HPF POC MODERATE (*)    All other components within normal limits  COMPREHENSIVE METABOLIC PANEL - Abnormal; Notable for the following:    Potassium 3.4 (*)    ALT 69 (*)    All other components within normal limits  CBC - Abnormal; Notable for the following:    HCT 35.7 (*)    All other components within normal limits  URINALYSIS, ROUTINE W REFLEX MICROSCOPIC (NOT AT [facility]) - Abnormal; Notable for the following:    Hgb urine dipstick LARGE (*)    All other components within normal limits  URINE MICROSCOPIC-ADD ON - Abnormal;  Notable for the following:    Squamous Epithelial / LPF 0-5 (*)    Bacteria, UA RARE (*)    All other components within normal limits  LIPASE, BLOOD  RAPID HIV SCREEN (HIV 1/2 AB+AG)  I-STAT BETA HCG BLOOD, ED ([facility], [facility], [facility] ONLY)  GC/CHLAMYDIA PROBE AMP ([place]) NOT AT [facility]    Imaging Review Ct Abdomen Pelvis W Contrast  07/05/2015  CLINICAL DATA:  Right upper quadrant pain. Heavy vaginal bleeding starting 06/23/25. EXAM: CT ABDOMEN AND PELVIS WITH CONTRAST TECHNIQUE: Multidetector CT imaging of the abdomen and pelvis was performed using the standard protocol following bolus administration of intravenous contrast. CONTRAST:  25mL OMNIPAQUE IOHEXOL 300 MG/ML SOLN, 100mL OMNIPAQUE IOHEXOL 300 MG/ML SOLN COMPARISON:  None. FINDINGS: Lower chest: Clear lung bases. Normal heart size without pericardial or pleural effusion. Hepatobiliary: Normal liver. Normal gallbladder, without biliary ductal dilatation. Pancreas: Normal, without mass or ductal dilatation. Spleen: Normal in size, without focal abnormality. Adrenals/Urinary Tract: Normal adrenal glands. Normal kidneys, without hydronephrosis. Normal urinary bladder. Stomach/Bowel: Normal stomach, without wall thickening. Colonic stool burden suggests constipation. Normal terminal ileum. Appendix is not visualized but there is no evidence of right lower quadrant inflammation. Normal small bowel.  Vascular/Lymphatic: Normal caliber of the aorta and branch vessels. No abdominopelvic adenopathy. Reproductive: Normal uterus. Relative hyper attenuation in the left adnexa/ovary measures 1.4 cm on image 65/series 2 likely represents a complex follicle. Other: Trace cul-de-sac fluid could be physiologic. There is subtle edema within the pelvic fat, including an anteriorly on images 56 and 60/series 2. Musculoskeletal: Mild degenerative sclerosis of bilateral sacroiliac joints. IMPRESSION: 1. No explanation for right upper quadrant pain or vaginal bleeding. 2.  Suspicion of a complex follicle in the left ovary. Small volume cul-de-sac fluid is likely physiologic. Subtle edema within the high anterior pelvic fat is nonspecific. Correlate with risk factors or signs of pelvic inflammatory disease, which could have this appearance. The adjacent bowel is normal in appearance, arguing against enteritis. 3. Lack of visualization of the appendix. Electronically Signed   By: Jeronimo Greaves M.D.   On: 07/05/2015 18:48   I have personally reviewed and evaluated these images and lab results as part of my medical decision-making.   EKG Interpretation None      MDM   Final diagnoses:  Vaginal bleeding  Flank pain  Bacterial vaginosis    Sharmaine Base presents with flank pain and vaginal bleeding for the past week.  Findings and plan of care discussed with Eber Hong, MD.  During the interview the patient was told that she would probably have to be referred to OB/GYN outpatient and that OB/GYN or PCP would be better equipped to deal with a complaint like vaginal bleeding. Patient then got upset and asked why she can see a gynecologist here in the [facility] ED. Patient was told that they really called in for emergencies and there is no indication for that at this time. Patient then states, "Well I think it's an emergency. I'm having clots in my bleeding and my mother died from blood clots." It was explained to the patient that blood clots, such as those in a PE or DVT, are completely different than clots seen and blood that has been exposed to the outside of the body with different risk factors and different levels for concern. CT is negative for issues that will need to be addressed here in the ED. Recommendation for follow-up by OB/GYN was communicated with patient. The patient was given instructions for home care as well as return precautions. Patient voices understanding of these instructions, accepts the plan, and is comfortable with discharge.  Anselm Pancoast, PA-C 07/05/15 1910  Eber Hong, MD 07/05/15 2220

## 2015-07-05 NOTE — ED Provider Notes (Signed)
The patient is a [age] year old female, she is overweight, she has a history of 5 days of vaginal bleeding every cycle, presents today with on her sixth day stating that it is heavy and there is still some vaginal clots. She also has some associated right upper quadrant pain which has been going on for some time, she states that she is rubbing and palpating this area stating that it makes it feel better. There is no food associated pain, no postprandial pain, on my exam she has a soft minimally tender abdomen in the right upper quadrant, vaginal exam performed by physician assistant, patient otherwise appears well. Labs evaluated and interpreted, no acute findings, CT scan ordered  Medical screening examination/treatment/procedure(s) were conducted as a shared visit with non-physician practitioner(s) and myself.  I personally evaluated the patient during the encounter.  Clinical Impression:   Final diagnoses:  Vaginal bleeding  Flank pain  Bacterial vaginosis          Eber HongBrian Lorren Splawn, MD 07/05/15 2220

## 2015-07-05 NOTE — ED Notes (Signed)
Pt reports heavy vaginal bleeding onset 12/26 and right rib cage pain; denies injury or urinary symptoms.

## 2015-07-08 LAB — GC/CHLAMYDIA PROBE AMP (~~LOC~~) NOT AT ARMC
Chlamydia: POSITIVE — AB
Neisseria Gonorrhea: NEGATIVE

## 2015-07-09 ENCOUNTER — Telehealth (HOSPITAL_BASED_OUTPATIENT_CLINIC_OR_DEPARTMENT_OTHER): Payer: Self-pay | Admitting: Emergency Medicine

## 2015-07-09 NOTE — Telephone Encounter (Signed)
Chart handoff to EDP for treatment plan for + chlamydia 

## 2015-07-10 ENCOUNTER — Telehealth (HOSPITAL_COMMUNITY): Payer: Self-pay

## 2015-07-10 NOTE — Telephone Encounter (Signed)
Chart reviewed by B. Laveda Normanran PA "Zithromax 1,000 mg po once" 07/10/2015 @ 13:40 ID verified x 2.  Pt informed of dx, addl tx needed, notify partner for testing and tx and abstain from sex x 2 wks.  DHHS form completed and faxed

## 2016-04-20 ENCOUNTER — Encounter (HOSPITAL_COMMUNITY): Payer: Self-pay | Admitting: *Deleted

## 2016-05-14 ENCOUNTER — Ambulatory Visit (HOSPITAL_COMMUNITY): Payer: PRIVATE HEALTH INSURANCE

## 2016-05-31 ENCOUNTER — Encounter: Payer: PRIVATE HEALTH INSURANCE | Admitting: Obstetrics & Gynecology

## 2016-06-03 ENCOUNTER — Ambulatory Visit (HOSPITAL_COMMUNITY)
Admission: RE | Admit: 2016-06-03 | Discharge: 2016-06-03 | Disposition: A | Discharge status: Home / Self Care | Payer: Self-pay | Source: Ambulatory Visit | Attending: Obstetrics and Gynecology | Admitting: Obstetrics and Gynecology

## 2016-06-03 ENCOUNTER — Encounter (HOSPITAL_COMMUNITY): Payer: Self-pay

## 2016-06-03 VITALS — BP 146/94 | Temp 98.2°F | Ht 61.0 in | Wt 220.6 lb

## 2016-06-03 DIAGNOSIS — Z0000 Encounter for general adult medical examination without abnormal findings: Secondary | ICD-10-CM | POA: Insufficient documentation

## 2016-06-03 DIAGNOSIS — Z1239 Encounter for other screening for malignant neoplasm of breast: Secondary | ICD-10-CM

## 2016-06-03 DIAGNOSIS — R87613 High grade squamous intraepithelial lesion on cytologic smear of cervix (HGSIL): Secondary | ICD-10-CM

## 2016-06-03 NOTE — Patient Instructions (Signed)
Explained breast self awareness to [name]. Patient did not need a Pap smear today due to last Pap smear was 04/07/2016. Explained the colposcopy the recommended follow up for her abnormal Pap smear. [facility] to [facility] for [facility] to follow up for abnormal Pap smear. Appointment scheduled for Thursday, June 24, 2016 at 0820. Patient aware of appointment and will be there. Let patient know that a screening mammogram is recommended at age 25 unless clinically indicated prior. [name] verbalized understanding.  Sheila Estes, Kathaleen Maserhristine Poll, RN 11:38 AM

## 2016-06-03 NOTE — Progress Notes (Signed)
[facility] due to having an abnormal Pap smear on 04/07/2016 that a colposcopy is recommended for follow up.  Pap Smear: Pap smear not completed today. [facility]. [facility] to the [facility] for a colpos[facility] to follow up for abnormal Pap smear. Appointment scheduled for Thursday, June 24, 2016 at 0820. Per patient her most recent Pap smear is the only abnormal Pap smear she has had. Last Pap smear result is in EPIC.  Physical exam: Breasts Right breast slightly larger than left breast that is normal per patient. No skin abnormalities bilateral breasts. No nipple retraction bilateral breasts. No nipple discharge bilateral breasts. No lymphadenopathy. No lumps palpated bilateral breasts. No complaints of pain or tenderness on exam. Screening mammogram recommended at age 25 unless clinically indicated prior.    Pelvic/Bimanual No Pap smear completed today since last Pap smear was 04/07/2016. Pap smear not indicated per BCCCP guidelines.   Smoking History: Patient has never smoked.  Patient Navigation: Patient education provided. [facility] services [facility].

## 2016-06-04 ENCOUNTER — Encounter (HOSPITAL_COMMUNITY): Payer: Self-pay | Admitting: *Deleted

## 2016-06-24 ENCOUNTER — Encounter: Payer: PRIVATE HEALTH INSURANCE | Admitting: Obstetrics & Gynecology

## 2016-07-16 ENCOUNTER — Telehealth (HOSPITAL_COMMUNITY): Payer: Self-pay | Admitting: *Deleted

## 2016-07-16 NOTE — Telephone Encounter (Signed)
Patient called BCCCP to reschedule colposcopy. Advised patient would need to WOC at [phone] and they could reschedule appointment. Patient voiced understanding.

## 2016-08-17 ENCOUNTER — Encounter: Payer: Self-pay | Admitting: Obstetrics and Gynecology

## 2016-08-17 ENCOUNTER — Ambulatory Visit (INDEPENDENT_AMBULATORY_CARE_PROVIDER_SITE_OTHER): Payer: Self-pay | Admitting: Obstetrics and Gynecology

## 2016-08-17 ENCOUNTER — Other Ambulatory Visit (HOSPITAL_COMMUNITY)
Admission: RE | Admit: 2016-08-17 | Discharge: 2016-08-17 | Disposition: A | Discharge status: Home / Self Care | Payer: Medicaid Other | Source: Ambulatory Visit | Attending: Obstetrics and Gynecology | Admitting: Obstetrics and Gynecology

## 2016-08-17 VITALS — BP 152/76 | HR 75 | Wt 220.5 lb

## 2016-08-17 DIAGNOSIS — Z91199 Patient's noncompliance with other medical treatment and regimen due to unspecified reason: Secondary | ICD-10-CM | POA: Insufficient documentation

## 2016-08-17 DIAGNOSIS — D060 Carcinoma in situ of endocervix: Secondary | ICD-10-CM | POA: Insufficient documentation

## 2016-08-17 DIAGNOSIS — R87613 High grade squamous intraepithelial lesion on cytologic smear of cervix (HGSIL): Secondary | ICD-10-CM | POA: Diagnosis present

## 2016-08-17 DIAGNOSIS — R87619 Unspecified abnormal cytological findings in specimens from cervix uteri: Secondary | ICD-10-CM | POA: Insufficient documentation

## 2016-08-17 DIAGNOSIS — Z9119 Patient's noncompliance with other medical treatment and regimen: Secondary | ICD-10-CM | POA: Insufficient documentation

## 2016-08-17 DIAGNOSIS — Z6841 Body Mass Index (BMI) 40.0 and over, adult: Secondary | ICD-10-CM | POA: Insufficient documentation

## 2016-08-17 LAB — POCT PREGNANCY, URINE: Preg Test, Ur: NEGATIVE

## 2016-08-17 NOTE — Progress Notes (Signed)
Colposcopy Procedure Note  Pre-operative Diagnosis:  04/2016: BCCCP pap HSIL 03/2015: pap HSIL (@ [facility]) 03/2014: pap ASC-H (@ [facility])  Per the notes from [facility], it looks like they tried to contact her re: her abnormal paps but were unsuccessful. She denies ever having had a colpo done.   Post-operative Diagnosis: CIN 2  Procedure Details  LMP 08/09/16; UPT negative. patient also denies any tobacco use or 2nd hand smoke exposure.   The risks (including infection, bleeding, pain) and benefits of the procedure were explained to the patient and written informed consent was obtained.  The patient was placed in the dorsal lithotomy position. A Graves was speculum inserted in the vagina, and the cervix was visualized.  AA staining done Lugol's with green filter.  Biopsy at 12 o'clock was done and then single toothed tenaculum applied and ECC in all four quadrants done. No bleeding after procedure, after application of Monsel's  Findings: Exam and ECC was very difficult due to patient anxiety, with patient locking legs closed and not relaxing bottom. If patient needs LEEP likely would need to be done in OR or at least try with pre-medications. on AA staining, there was mild punctuate lesions seen circumferentially and most at 12 o'clock with AWE lesions seen in these areas, too. Lugol's confirmatory  Adequate: no  Specimens: 12 o'clock biopsy and ECC  Condition: Stable  Complications: None  Plan: The patient was advised to call for any fever or for prolonged or severe pain or bleeding. She was advised to use OTC analgesics as needed for mild to moderate pain. She was advised to avoid vaginal intercourse for 48 hours or until the bleeding has completely stopped.  D/w her that likely will recommend LEEP or CKC based on inadequate colpo and pap smear results, but that she will need to adhere to close follow up.   Patient amenable to plan   Cornelia Copaharlie Kameah Rawl, Jr MD Attending Center for [facility](Faculty Practice)

## 2016-08-19 NOTE — Procedures (Signed)
Colposcopy Procedure Note  Pre-operative Diagnosis:  04/2016: [facility]CP pap HSIL 03/2015: pap HSIL (@ [facility]) 03/2014: pap ASC-H (@ [facility])  Per the notes from [facility], it looks like they tried to contact her re: her abnormal paps but were unsuccessful. She denies ever having had a colpo done.   Post-operative Diagnosis: CIN 2  Procedure Details  LMP 08/09/16; UPT negative. patient also denies any tobacco use or 2nd hand smoke exposure.   The risks (including infection, bleeding, pain) and benefits of the procedure were explained to the patient and written informed consent was obtained.  The patient was placed in the dorsal lithotomy position. A Graves was speculum inserted in the vagina, and the cervix was visualized.  AA staining done Lugol's with green filter.  Biopsy at 12 o'clock was done and then single toothed tenaculum applied and ECC in all four quadrants done. No bleeding after procedure, after application of Monsel's  Findings: Exam and ECC was very difficult due to patient anxiety, with patient locking legs closed and not relaxing bottom. If patient needs LEEP likely would need to be done in OR or at least try with pre-medications. on AA staining, there was mild punctuate lesions seen circumferentially and most at 12 o'clock with AWE lesions seen in these areas, too. Lugol's confirmatory  Adequate: no  Specimens: 12 o'clock biopsy and ECC  Condition: Stable  Complications: None  Plan: The patient was advised to call for any fever or for prolonged or severe pain or bleeding. She was advised to use OTC analgesics as needed for mild to moderate pain. She was advised to avoid vaginal intercourse for 48 hours or until the bleeding has completely stopped.  D/w her that likely will recommend LEEP or CKC based on inadequate colpo and pap smear results, but that she will need to adhere to close follow up.   Patient amenable to plan   Cornelia Copaharlie Annasofia Pohl, Jr MD Attending Center  for [facility](Faculty Practice)

## 2016-08-23 ENCOUNTER — Telehealth: Payer: Self-pay | Admitting: Obstetrics and Gynecology

## 2016-08-23 NOTE — Telephone Encounter (Signed)
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 Given this and inadequate colpo, I told her I recommend CKC in the OR, even after potential OB risk (PTB, short cervix, PTL, etc.); pt is amenable to proceeding with OR.  I told her someone will call her and go over dates and I recommend doing it sometime in the next month. All questions asked and answered.  Sheila Estes, Jr MD Attending Center for [facility] (Faculty Practice) 08/23/2016 Time: 1202pm

## 2016-08-24 ENCOUNTER — Encounter (HOSPITAL_COMMUNITY): Payer: Self-pay

## 2016-08-30 ENCOUNTER — Telehealth (HOSPITAL_COMMUNITY): Payer: Self-pay | Admitting: *Deleted

## 2016-08-30 NOTE — Telephone Encounter (Signed)
Telephoned patient and advised patient was eligible for BCCCP Medicaid due to status of colposcopy. Patient to meet Tuesday Feb 27 4:00 to fill out paperwork.

## 2016-09-10 ENCOUNTER — Encounter (HOSPITAL_COMMUNITY): Payer: Self-pay | Admitting: *Deleted

## 2016-09-23 ENCOUNTER — Encounter (HOSPITAL_COMMUNITY): Payer: Self-pay

## 2016-11-09 ENCOUNTER — Encounter: Payer: Self-pay | Admitting: Obstetrics and Gynecology

## 2016-11-09 ENCOUNTER — Encounter (HOSPITAL_COMMUNITY): Payer: Self-pay | Admitting: Anesthesiology

## 2016-11-09 ENCOUNTER — Ambulatory Visit (HOSPITAL_COMMUNITY)
Admission: RE | Admit: 2016-11-09 | Payer: Medicaid Other | Source: Ambulatory Visit | Admitting: Obstetrics and Gynecology

## 2016-11-09 ENCOUNTER — Other Ambulatory Visit: Payer: Self-pay | Admitting: Obstetrics and Gynecology

## 2016-11-09 HISTORY — DX: Anxiety disorder, unspecified: F41.9

## 2016-11-09 HISTORY — DX: Pneumonia, unspecified organism: J18.9

## 2016-11-09 HISTORY — DX: Major depressive disorder, single episode, unspecified: F32.9

## 2016-11-09 HISTORY — DX: Depression, unspecified: F32.A

## 2016-11-09 SURGERY — CONE BIOPSY, CERVIX
Anesthesia: Choice

## 2016-11-11 ENCOUNTER — Telehealth: Payer: Self-pay | Admitting: Obstetrics and Gynecology

## 2016-11-11 NOTE — Telephone Encounter (Signed)
GYN Telephone Note Patient called at [phone] re: missed surgery this week. She states she missed due to her sister being unable, again, to pick her up from surgery and stay with her post op. 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 Patient voices acknowledgement and states that she has friends that can pick her up and watch her for 24h after the surgery. inbasket message sent to scheduler to reschedule surgery.  Sheila Estes, Jr MD Attending Center [facility] (Faculty Practice) 11/11/2016 Time: 1202pm

## 2016-11-16 ENCOUNTER — Telehealth: Payer: Self-pay | Admitting: *Deleted

## 2016-11-16 NOTE — Telephone Encounter (Signed)
Telephone call to patient regarding her upcoming surgery.  Notified patient of surgery on 12/14/16 at 9:10am.  In[facility]ed [facility] at 7:30am, NPO.

## 2016-12-07 ENCOUNTER — Encounter (HOSPITAL_COMMUNITY): Payer: Self-pay | Admitting: *Deleted

## 2016-12-13 ENCOUNTER — Other Ambulatory Visit: Payer: Self-pay | Admitting: Obstetrics and Gynecology

## 2016-12-14 ENCOUNTER — Ambulatory Visit (HOSPITAL_COMMUNITY): Payer: Medicaid Other | Admitting: Anesthesiology

## 2016-12-14 ENCOUNTER — Encounter (HOSPITAL_COMMUNITY)
Admission: RE | Disposition: A | Discharge status: Home / Self Care | Payer: Self-pay | Source: Ambulatory Visit | Attending: Obstetrics and Gynecology

## 2016-12-14 ENCOUNTER — Encounter: Payer: Self-pay | Admitting: Obstetrics and Gynecology

## 2016-12-14 ENCOUNTER — Ambulatory Visit (HOSPITAL_COMMUNITY)
Admission: RE | Admit: 2016-12-14 | Discharge: 2016-12-14 | Disposition: A | Discharge status: Home / Self Care | Payer: Medicaid Other | Source: Ambulatory Visit | Attending: Obstetrics and Gynecology | Admitting: Obstetrics and Gynecology

## 2016-12-14 DIAGNOSIS — D067 Carcinoma in situ of other parts of cervix: Secondary | ICD-10-CM | POA: Insufficient documentation

## 2016-12-14 DIAGNOSIS — D069 Carcinoma in situ of cervix, unspecified: Secondary | ICD-10-CM

## 2016-12-14 DIAGNOSIS — F319 Bipolar disorder, unspecified: Secondary | ICD-10-CM | POA: Insufficient documentation

## 2016-12-14 DIAGNOSIS — F419 Anxiety disorder, unspecified: Secondary | ICD-10-CM | POA: Diagnosis not present

## 2016-12-14 DIAGNOSIS — J45909 Unspecified asthma, uncomplicated: Secondary | ICD-10-CM | POA: Diagnosis not present

## 2016-12-14 DIAGNOSIS — D060 Carcinoma in situ of endocervix: Secondary | ICD-10-CM

## 2016-12-14 DIAGNOSIS — Z6841 Body Mass Index (BMI) 40.0 and over, adult: Secondary | ICD-10-CM | POA: Insufficient documentation

## 2016-12-14 HISTORY — DX: Unspecified abnormal cytological findings in specimens from vagina: R87.629

## 2016-12-14 HISTORY — PX: CERVICAL CONIZATION W/BX: SHX1330

## 2016-12-14 LAB — TYPE AND SCREEN
ABO/RH(D): B POS
Antibody Screen: NEGATIVE

## 2016-12-14 LAB — CBC
HEMATOCRIT: 38.8 % (ref 36.0–46.0)
HEMOGLOBIN: 13.7 g/dL (ref 12.0–15.0)
MCH: 29.5 pg (ref 26.0–34.0)
MCHC: 35.3 g/dL (ref 30.0–36.0)
MCV: 83.4 fL (ref 78.0–100.0)
Platelets: 348 10*3/uL (ref 150–400)
RBC: 4.65 MIL/uL (ref 3.87–5.11)
RDW: 13.1 % (ref 11.5–15.5)
WBC: 4.6 10*3/uL (ref 4.0–10.5)

## 2016-12-14 LAB — ABO/RH: ABO/RH(D): B POS

## 2016-12-14 LAB — PREGNANCY, URINE: PREG TEST UR: NEGATIVE

## 2016-12-14 SURGERY — CONE BIOPSY, CERVIX
Anesthesia: General | Site: Vagina

## 2016-12-14 MED ORDER — FENTANYL CITRATE (PF) 250 MCG/5ML IJ SOLN
INTRAMUSCULAR | Status: AC
Start: 2016-12-14 — End: ?
  Filled 2016-12-14: qty 5

## 2016-12-14 MED ORDER — LACTATED RINGERS IV SOLN
INTRAVENOUS | Status: DC
  Administered 2016-12-14 (×2): via INTRAVENOUS

## 2016-12-14 MED ORDER — IODINE STRONG (LUGOLS) 5 % PO SOLN
ORAL | Status: DC | PRN
  Administered 2016-12-14: 0.1 mL via ORAL

## 2016-12-14 MED ORDER — FERRIC SUBSULFATE 259 MG/GM EX SOLN
CUTANEOUS | Status: AC
  Filled 2016-12-14: qty 8

## 2016-12-14 MED ORDER — LIDOCAINE-EPINEPHRINE 1 %-1:100000 IJ SOLN
INTRAMUSCULAR | Status: AC
  Filled 2016-12-14: qty 1

## 2016-12-14 MED ORDER — ONDANSETRON HCL 4 MG/2ML IJ SOLN
INTRAMUSCULAR | Status: AC
  Filled 2016-12-14: qty 2

## 2016-12-14 MED ORDER — MIDAZOLAM HCL 5 MG/5ML IJ SOLN
INTRAMUSCULAR | Status: DC | PRN
  Administered 2016-12-14: 2 mg via INTRAVENOUS

## 2016-12-14 MED ORDER — PROPOFOL 10 MG/ML IV BOLUS
INTRAVENOUS | Status: AC
  Filled 2016-12-14: qty 40

## 2016-12-14 MED ORDER — LIDOCAINE-EPINEPHRINE 1 %-1:100000 IJ SOLN
INTRAMUSCULAR | Status: DC | PRN
  Administered 2016-12-14: 15 mL

## 2016-12-14 MED ORDER — PROPOFOL 10 MG/ML IV BOLUS
INTRAVENOUS | Status: AC
Start: 2016-12-14 — End: ?
  Filled 2016-12-14: qty 20

## 2016-12-14 MED ORDER — ACETIC ACID 4% SOLUTION
Status: DC | PRN
  Administered 2016-12-14: 1 via TOPICAL

## 2016-12-14 MED ORDER — FENTANYL CITRATE (PF) 100 MCG/2ML IJ SOLN
INTRAMUSCULAR | Status: DC | PRN
  Administered 2016-12-14 (×3): 50 ug via INTRAVENOUS
  Administered 2016-12-14: 100 ug via INTRAVENOUS

## 2016-12-14 MED ORDER — IODINE STRONG (LUGOLS) 5 % PO SOLN
ORAL | Status: AC
  Filled 2016-12-14: qty 1

## 2016-12-14 MED ORDER — DEXAMETHASONE SODIUM PHOSPHATE 10 MG/ML IJ SOLN
INTRAMUSCULAR | Status: AC
  Filled 2016-12-14: qty 1

## 2016-12-14 MED ORDER — BACITRACIN ZINC 500 UNIT/GM EX OINT
TOPICAL_OINTMENT | CUTANEOUS | Status: DC | PRN
  Administered 2016-12-14: 1 via TOPICAL

## 2016-12-14 MED ORDER — LIDOCAINE HCL (CARDIAC) 20 MG/ML IV SOLN
INTRAVENOUS | Status: DC | PRN
  Administered 2016-12-14: 60 mg via INTRAVENOUS

## 2016-12-14 MED ORDER — MIDAZOLAM HCL 2 MG/2ML IJ SOLN
INTRAMUSCULAR | Status: AC
  Filled 2016-12-14: qty 2

## 2016-12-14 MED ORDER — LIDOCAINE HCL (CARDIAC) 20 MG/ML IV SOLN
INTRAVENOUS | Status: AC
  Filled 2016-12-14: qty 5

## 2016-12-14 MED ORDER — SCOPOLAMINE 1 MG/3DAYS TD PT72
1.0000 | MEDICATED_PATCH | Freq: Once | TRANSDERMAL | Status: DC
  Administered 2016-12-14: 1.5 mg via TRANSDERMAL

## 2016-12-14 MED ORDER — PROPOFOL 10 MG/ML IV BOLUS
INTRAVENOUS | Status: DC | PRN
  Administered 2016-12-14: 100 mg via INTRAVENOUS
  Administered 2016-12-14: 200 mg via INTRAVENOUS

## 2016-12-14 MED ORDER — ONDANSETRON HCL 4 MG/2ML IJ SOLN
INTRAMUSCULAR | Status: DC | PRN
  Administered 2016-12-14: 4 mg via INTRAVENOUS

## 2016-12-14 MED ORDER — LACTATED RINGERS IV SOLN: INTRAVENOUS | Status: DC

## 2016-12-14 MED ORDER — SCOPOLAMINE 1 MG/3DAYS TD PT72
MEDICATED_PATCH | TRANSDERMAL | Status: AC
  Administered 2016-12-14: 1.5 mg via TRANSDERMAL
  Filled 2016-12-14: qty 1

## 2016-12-14 MED ORDER — DEXAMETHASONE SODIUM PHOSPHATE 10 MG/ML IJ SOLN
INTRAMUSCULAR | Status: DC | PRN
  Administered 2016-12-14: 10 mg via INTRAVENOUS

## 2016-12-14 MED ORDER — ACETIC ACID 5 % SOLN
Status: AC
  Filled 2016-12-14: qty 500

## 2016-12-14 MED ORDER — BACITRACIN ZINC 500 UNIT/GM EX OINT
TOPICAL_OINTMENT | CUTANEOUS | Status: AC
  Filled 2016-12-14: qty 28.35

## 2016-12-14 SURGICAL SUPPLY — 29 items
APPLICATOR COTTON TIP 6IN STRL (MISCELLANEOUS) ×4 IMPLANT
BLADE BIOSPY CERVICAL (BLADE) ×2 IMPLANT
BLADE SURG 11 STRL SS (BLADE) ×2 IMPLANT
CANISTER SUCT 3000ML PPV (MISCELLANEOUS) ×2 IMPLANT
CATH ROBINSON RED A/P 16FR (CATHETERS) ×2 IMPLANT
CLOTH BEACON ORANGE TIMEOUT ST (SAFETY) ×2 IMPLANT
CONTAINER PREFILL 10% NBF 60ML (FORM) ×4 IMPLANT
COUNTER NEEDLE 1200 MAGNETIC (NEEDLE) ×2 IMPLANT
ELECT BALL LEEP 5MM RED (ELECTRODE) ×2 IMPLANT
ELECT REM PT RETURN 9FT ADLT (ELECTROSURGICAL) ×2
ELECTRODE REM PT RTRN 9FT ADLT (ELECTROSURGICAL) ×1 IMPLANT
GLOVE INDICATOR 7.5 STRL GRN (GLOVE) ×2 IMPLANT
GLOVE SURG SS PI 7.0 STRL IVOR (GLOVE) ×2 IMPLANT
GOWN STRL REUS W/ TWL LRG LVL3 (GOWN DISPOSABLE) ×1 IMPLANT
GOWN STRL REUS W/ TWL XL LVL3 (GOWN DISPOSABLE) ×1 IMPLANT
GOWN STRL REUS W/TWL LRG LVL3 (GOWN DISPOSABLE) ×1
GOWN STRL REUS W/TWL XL LVL3 (GOWN DISPOSABLE) ×1
HEMOSTAT SURGICEL 2X3 (HEMOSTASIS) IMPLANT
NS IRRIG 1000ML POUR BTL (IV SOLUTION) ×2 IMPLANT
PACK VAGINAL MINOR WOMEN LF (CUSTOM PROCEDURE TRAY) ×2 IMPLANT
PAD OB MATERNITY 4.3X12.25 (PERSONAL CARE ITEMS) ×2 IMPLANT
PENCIL BUTTON HOLSTER BLD 10FT (ELECTRODE) ×2 IMPLANT
SCOPETTES 8  STERILE (MISCELLANEOUS) ×2
SCOPETTES 8 STERILE (MISCELLANEOUS) ×2 IMPLANT
SUT CHROMIC 2 0 CT 1 (SUTURE) ×4 IMPLANT
SUT SILK 3 0 SH 30 (SUTURE) ×4 IMPLANT
TOWEL OR 17X24 6PK STRL BLUE (TOWEL DISPOSABLE) ×4 IMPLANT
TUBING NON-CON 1/4 X 20 CONN (TUBING) ×2 IMPLANT
YANKAUER SUCT BULB TIP NO VENT (SUCTIONS) ×2 IMPLANT

## 2016-12-14 NOTE — Anesthesia Preprocedure Evaluation (Signed)
Anesthesia Evaluation  Patient identified by MRN, date of birth, ID band Patient awake    Reviewed: Allergy & Precautions, NPO status , Patient's Chart, lab work & pertinent test results  Airway Mallampati: II  TM Distance: >3 FB Neck ROM: Full    Dental no notable dental hx.    Pulmonary neg pulmonary ROS, asthma ,    Pulmonary exam normal breath sounds clear to auscultation       Cardiovascular negative cardio ROS Normal cardiovascular exam Rhythm:Regular Rate:Normal     Neuro/Psych Anxiety Depression Bipolar Disorder negative neurological ROS  negative psych ROS   GI/Hepatic negative GI ROS, Neg liver ROS,   Endo/Other  negative endocrine ROSMorbid obesity  Renal/GU negative Renal ROS  negative genitourinary   Musculoskeletal negative musculoskeletal ROS (+)   Abdominal (+) + obese,   Peds negative pediatric ROS (+)  Hematology negative hematology ROS (+)   Anesthesia Other Findings   Reproductive/Obstetrics negative OB ROS                             Anesthesia Physical Anesthesia Plan  ASA: III  Anesthesia Plan: General   Post-op Pain Management:    Induction: Intravenous  PONV Risk Score and Plan: 4 or greater and Ondansetron, Dexamethasone, Propofol, Midazolam, Scopolamine patch - Pre-op and Treatment may vary due to age or medical condition  Airway Management Planned:   Additional Equipment:   Intra-op Plan:   Post-operative Plan: Extubation in OR  Informed Consent: I have reviewed the patients History and Physical, chart, labs and discussed the procedure including the risks, benefits and alternatives for the proposed anesthesia with the patient or authorized representative who has indicated his/her understanding and acceptance.   Dental advisory given  Plan Discussed with: CRNA  Anesthesia Plan Comments:         Anesthesia Quick Evaluation

## 2016-12-14 NOTE — H&P (Signed)
Obstetrics & Gynecology H&P   Date of Admission: 12/14/2016   Primary OBGYN: [facility] for [facility] Primary Care Provider: System, Pcp Not In  Reason for Admission: scheduled surgery  History of Present Illness: Sheila Estes is a 26 y.o. G1P1001 (Patient's last menstrual period was 11/27/2016 (approximate).), with the above CC. PMHx is significant for BMI 42, long h/o cervical dysplasia, h/o STDs, patient non compliance.  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  OBGYN History: As per HPI. OB History  Gravida Para Term Preterm AB Living  1 1 1     1   SAB TAB Ectopic Multiple Live Births          1    # Outcome Date GA Lbr Len/2nd Weight Sex Delivery Anes PTL Lv  1 Term             Obstetric Comments  SVD x 1    Past Medical History: Past Medical History:  Diagnosis Date  . Abnormal vaginal Pap smear   . Anxiety   . Asthma    HX - rarely uses inhaler - seasonal  . Bipolar 1 disorder (HCC)   . Depression   . History of depression   . History of trichomoniasis   . Pneumonia    CHILDHOOD    Past Surgical History: Past Surgical History:  Procedure Laterality Date  . ARTHROSCOPTIC SHOULDER Left 2007    Family History:  Family History  Problem Relation Age of Onset  . Hypertension Mother   . Hypertension Father   . Diabetes Maternal Grandmother   . Hypertension Maternal Grandfather     Social History:  Social History   Social History  . Marital status: Single    Spouse name: N/A  . Number of children: N/A  . Years of education: N/A   Occupational History  . Not on file.   Social History Main Topics  . Smoking status: Never Smoker  . Smokeless tobacco: Never Used  . Alcohol use Yes     Comment: socially wine  . Drug  use: No  . Sexual activity: Yes    Birth control/ protection: Condom   Other Topics Concern  . Not on file   Social History Narrative  . No narrative on file     Allergy: Allergies  Allergen Reactions  . Other Shortness Of Breath    Environmental allergies  . Gluten Meal     Gluten makes her wheeze    Current Outpatient Medications: Prescriptions Prior to Admission  Medication Sig Dispense Refill Last Dose  . albuterol (PROVENTIL HFA;VENTOLIN HFA) 108 (90 Base) MCG/ACT inhaler Inhale 2 puffs into the lungs every 6 (six) hours as needed for wheezing or shortness of breath.   Past Month at Unknown time  . buPROPion (WELLBUTRIN) 100 MG tablet Take 100 mg by mouth 2 (two) times daily.    Past Week at Unknown time  . citalopram (CELEXA) 20 MG tablet Take 20 mg by mouth daily.   Past Week at Unknown time  . ibuprofen (ADVIL,MOTRIN) 200 MG tablet Take 200 mg by mouth daily as needed for headache.    Not Taking  . loratadine (CLARITIN) 10 MG tablet Take 10 mg by mouth daily as needed for allergies.  12/13/2016 at Unknown time     Hospital Medications: Current Facility-Administered Medications  Medication Dose Route Frequency Provider Last Rate Last Dose  . lactated ringers infusion   Intravenous Continuous Cristela BlueJackson, Kyle, MD 125 mL/hr at 12/14/16 [phone]    . lactated ringers infusion   Intravenous Continuous [place] BingPickens, Khyleigh Furney, MD      . scopolamine (TRANSDERM-SCOP) 1 MG/3DAYS 1.5 mg  1 patch Transdermal Once Cristela BlueJackson, Kyle, MD   1.5 mg at 12/14/16 0836     Physical Exam:  Current Vital Signs 24h Vital Sign Ranges  T 98.8 F (37.1 C) Temp  Avg: 98.8 F (37.1 C)  Min: 98.8 F (37.1 C)  Max: 98.8 F (37.1 C)  BP (!) 147/75 BP  Min: 147/75  Max: 147/75  HR 75 Pulse  Avg: 75  Min: 75  Max: 75  RR 16 Resp  Avg: 16  Min: 16  Max: 16  SaO2 100 % Not Delivered SpO2  Avg: 100 %  Min: 100 %  Max: 100 %       24 Hour I/O Current Shift I/O  Time Ins Outs No intake/output data recorded.  No intake/output data recorded.   Body mass index is 41.57 kg/m. General appearance: Well nourished, well developed female in no acute distress.  Cardiovascular: S1, S2 normal, no murmur, rub or gallop, regular rate and rhythm Respiratory:  Clear to auscultation bilateral. Normal respiratory effort Abdomen: positive bowel sounds and no masses, hernias; diffusely non tender to palpation, non distended. Dermal piercing in infraumbilical area Neuro/Psych:  Normal mood and affect.  Skin:  Warm and dry.  Extremities: no clubbing, cyanosis, or edema.  Lymphatic:  No inguinal lymphadenopathy.   Laboratory: UPT, CBC, type and screen pending  Imaging:  none  Assessment: Sheila Estes is a 26 y.o. G1P1001 (Patient's last menstrual period was 11/27/2016 (approximate).), pt stable  Plan: D/w her re: procedure and pt is amenable to proceeding with surgery. Post op instructions d/w pt. Patient has "dermal piercing" on abdomen and d/w her will have to remove it and her post tragus earring b/c will have to use cautery. Pt amenable to removing this.   Cornelia Copaharlie Dsean Vantol, Jr. MD Attending [facility](Faculty Practice)

## 2016-12-14 NOTE — Anesthesia Postprocedure Evaluation (Signed)
Anesthesia Post Note  Patient: Sharmaine BaseBethany Kutch  Procedure(s) Performed: Procedure(s) (LRB): COLD KNIFE CONIZATION, REMOVAL OF BODY JEWELRY (N/A)     Patient location during evaluation: PACU Anesthesia Type: General Level of consciousness: awake and alert Pain management: pain level controlled Vital Signs Assessment: post-procedure vital signs reviewed and stable Respiratory status: spontaneous breathing, nonlabored ventilation and respiratory function stable Cardiovascular status: blood pressure returned to baseline and stable Postop Assessment: no signs of nausea or vomiting Anesthetic complications: no    Last Vitals:  Vitals:   12/14/16 1045 12/14/16 1115  BP:  (!) 146/80  Pulse: 73 90  Resp: 14 20  Temp:      Last Pain:  Vitals:   12/14/16 1115  TempSrc:   PainSc: 0-No pain   Pain Goal: Patients Stated Pain Goal: 3 (12/14/16 1014)               Lowella CurbWarren Ray Miller

## 2016-12-14 NOTE — Op Note (Signed)
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  CONDITION: stable  FINDINGS: Exam under anesthesia revealed right tragus and infraumbilical dermal piercing. On pelvic, normal EGBUS, vaginal vault. AA and Lugol's staining of the cervix showed a 1cm area at the 12 o'clock area of the external os that showed slight AWE changes and mahogany staining.  Approximately 1.5cm wide base that coned in specimen with overall length approximately 2-2.5cm. Silk stitch tied at the 12 o'clock position of the specimen.   PROCEDURE IN DETAIL:  After informed consent was obtained, the patient was taken to the operating room where anesthesia was obtained without difficulty. The patient was positioned in the dorsal lithotomy position in [place] stirrups. The tragus piercing was removed by unscrewing the base and infraumbilcal piercing was removed after prepping the abdomen with betadine, injecting with lidocaine with epinephrine and then using a hemostat to easily remove it from the patient's skin. A gauze tegaderm and bacitracin ointment were placed over the site. The bi-valved speculum was placed inside the patient's vagina and acetic acid and Lugol's applied with the above noted findings. The anterior lip of the cervix was then grasped with a tenaculum, and using #1 chromic, two hemostatic sutures, were then placed on lateral aspect of the cervix, with one suture on each  side, in a figure of eight fashion.  Next, 10mL of lidocaine with epi was injected into the cervical stroma. The tenaculum was then removed after placing two silk stitches at 12 (tied) and 6 (not tied) and using hemostats for tracing; a uterine sound was then placed to better determine the route of the endocervical canal. An 11 blade was then used to make the CKC specimen. The ECC was done after reapplication of the tenaculum. The tenaculum was removed and the bed coagulated on 50 coag with the ball cautery and excellent hemostasis noted. The Monsel's was then applied. The patient tolerated the procedure well. The patient was taken to recovery room in excellent condition.  Cornelia Copaharlie Ksean Vale, Jr MD Attending Center for [facility](Faculty Practice)

## 2016-12-14 NOTE — Discharge Instructions (Signed)
° °  We will discuss your surgery once again in detail at your post-op visit in four weeks. If you havent already done so, please call to make your appointment as soon as possible.  These instructions give you information on caring for yourself after your procedure. Your doctor may also give you more specific instructions. Call your doctor if you have any problems or questions after your procedure. HOME CARE  Do not drive for 24 hours.  Wait 1 week before doing any activities that wear you out.  Do not stand for a long time.  Limit stair climbing to once or twice a day.  Rest often.  Continue with your usual diet.  Drink enough fluids to keep your pee (urine) clear or pale yellow.  If you have a hard time pooping (constipation), you may:  Take a medicine to help you go poop (laxative) as told by your doctor.  Eat more fruit and bran.  Drink more fluids.  Take showers, not baths, for as long as told by your doctor.  Do not swim or use a hot tub until your doctor says it is okay.  Have someone with you for 1day after the procedure.  Do not douche, use tampons, or have sex (intercourse) or have anything in the vagina until seen by your doctor  Only take medicines as told by your doctor. Do not take aspirin. It can cause bleeding.  Keep all doctor visits. GET HELP IF:  You have cramps or pain not helped by medicine.  You have new pain in the belly (abdomen).  You have a bad smelling fluid coming from your vagina.  You have a rash.  You have problems with any medicine. GET HELP RIGHT AWAY IF:   You start to bleed more than a regular period.  You have a fever.  You have chest pain.  You have trouble breathing.  You feel dizzy or feel like passing out (fainting).  You pass out.  You have pain in the tops of your shoulders.  You have vaginal bleeding with or without clumps of blood (blood clots). MAKE SURE YOU:  Understand these instructions.  Will watch  your condition.  Will get help right away if you are not doing well or get worse. Document Released: 03/30/2008 Document Revised: 06/26/2013 Document Reviewed: 01/18/2013 [facility] Patient Information 2015 [place]. This information is not intended to replace advice given to you by your health care provider. Make sure you discuss any questions you have with your health care provider.

## 2016-12-14 NOTE — Transfer of Care (Signed)
Immediate Anesthesia Transfer of Care Note  Patient: Sheila Estes  Procedure(s) Performed: Procedure(s): COLD KNIFE CONIZATION, REMOVAL OF BODY JEWELRY (N/A)  Patient Location: PACU  Anesthesia Type:General  Level of Consciousness: sedated  Airway & Oxygen Therapy: Patient Spontanous Breathing and Patient connected to nasal cannula oxygen  Post-op Assessment: Report given to RN and Post -op Vital signs reviewed and stable  Post vital signs: stable  Last Vitals:  Vitals:   12/14/16 0827  BP: (!) 147/75  Pulse: 75  Resp: 16  Temp: 37.1 C    Last Pain:  Vitals:   12/14/16 0827  TempSrc: Oral      Patients Stated Pain Goal: 3 (12/14/16 0827)  Complications: No apparent anesthesia complications

## 2016-12-14 NOTE — Anesthesia Procedure Notes (Signed)
Procedure Name: LMA Insertion Date/Time: 12/14/2016 9:20 AM Performed by: Fanny DanceMULLINS, Shammara Jarrett L Pre-anesthesia Checklist: Patient identified, Emergency Drugs available, Suction available and Patient being monitored Patient Re-evaluated:Patient Re-evaluated prior to inductionOxygen Delivery Method: Circle system utilized Preoxygenation: Pre-oxygenation with 100% oxygen Intubation Type: IV induction Ventilation: Oral airway inserted - appropriate to patient size LMA: LMA inserted LMA Size: 4.0 Number of attempts: 1 Placement Confirmation: positive ETCO2 and breath sounds checked- equal and bilateral Dental Injury: Teeth and Oropharynx as per pre-operative assessment

## 2016-12-15 ENCOUNTER — Encounter (HOSPITAL_COMMUNITY): Payer: Self-pay | Admitting: Obstetrics and Gynecology

## 2016-12-22 ENCOUNTER — Telehealth: Payer: Self-pay | Admitting: Obstetrics and Gynecology

## 2016-12-22 NOTE — Telephone Encounter (Signed)
GYN Telephone Note  Patient called and informed re results. I called pathology and they state that they only see endometrial cells and no endocervical cells on the OR ECC. D/w pt that it looks like I got everything (CKC sample with CIN3 and negative margins) and will just repeat an ECC at her post op visit to make sure it's clear.  She's having some d/c but it sounds like normal postop healing. Pt told if worsens or further concerns to call us for a sooner visit. Pt amenable to plan  Sheila Estes, Jr MD Attending [facility] (Faculty Practice) 12/22/2016 Time: 1046am

## 2017-01-12 ENCOUNTER — Encounter: Payer: Self-pay | Admitting: Obstetrics and Gynecology

## 2017-01-12 ENCOUNTER — Other Ambulatory Visit (HOSPITAL_COMMUNITY)
Admission: RE | Admit: 2017-01-12 | Discharge: 2017-01-12 | Disposition: A | Discharge status: Home / Self Care | Payer: Medicaid Other | Source: Ambulatory Visit | Attending: Obstetrics and Gynecology | Admitting: Obstetrics and Gynecology

## 2017-01-12 ENCOUNTER — Ambulatory Visit (INDEPENDENT_AMBULATORY_CARE_PROVIDER_SITE_OTHER): Payer: Medicaid Other | Admitting: Obstetrics and Gynecology

## 2017-01-12 VITALS — BP 152/76 | HR 87 | Ht 61.0 in | Wt 221.4 lb

## 2017-01-12 DIAGNOSIS — Z3202 Encounter for pregnancy test, result negative: Secondary | ICD-10-CM | POA: Diagnosis not present

## 2017-01-12 DIAGNOSIS — D069 Carcinoma in situ of cervix, unspecified: Secondary | ICD-10-CM

## 2017-01-12 LAB — POCT PREGNANCY, URINE: PREG TEST UR: NEGATIVE

## 2017-01-12 NOTE — Progress Notes (Signed)
Post Op Visit [facility] 01/12/2017  CC: regular post op visit   Subjective:     Sheila Estes is a 26 y.o.  S/p 6/12 CKC and ECC for CIN 3. Sample showed CIN 3 with negative margins; ECC only showed negative endometrial tissue.  Patient without issue or complaints. She had a normal period in late June. Nothing per vagina since surgery.    Review of Systems Pertinent items are noted in HPI.    Objective:    BP (!) 152/76   Pulse 87   Ht 5\' 1"  (1.549 m)   Wt 221 lb 6.4 oz (100.4 kg)   LMP 12/31/2016 (Approximate)   BMI 41.83 kg/m   NAD EGBUS normal Vaginal vault: negative Cervix: well healed, nttp  4 quadrant ECC done after application of single toothed tenaculum  Assessment:  Doing well   Plan:   D/w pt re: path. If negative ECC, recommend q year paps for the next few years.   Pt to call PCP to set up PC visit.  Pt amenable to plan  Sheila Copaharlie Glenys Snader, Jr MD Attending [facility] (Faculty Practice)  RTC: PRN

## 2017-01-20 ENCOUNTER — Telehealth: Payer: Self-pay | Admitting: General Practice

## 2017-01-20 NOTE — Telephone Encounter (Signed)
-----   Message from Isabella Bingharlie Pickens, MD sent at 01/17/2017  8:28 AM EDT ----- I sent her a mychart message but can you let her know that everything looks clear and she just needs a pap smear and hpv co-testing q year for the next 2-3 years. Thanks!

## 2017-01-20 NOTE — Telephone Encounter (Signed)
Called patient and informed her of results & recommendations. Patient verbalized understanding and had no questions

## 2018-08-19 ENCOUNTER — Other Ambulatory Visit: Payer: Self-pay

## 2018-08-19 ENCOUNTER — Encounter (HOSPITAL_COMMUNITY): Payer: Self-pay

## 2018-08-19 ENCOUNTER — Ambulatory Visit (HOSPITAL_COMMUNITY)
Admission: EM | Admit: 2018-08-19 | Discharge: 2018-08-19 | Disposition: A | Discharge status: Home / Self Care | Payer: Self-pay | Attending: Family Medicine | Admitting: Family Medicine

## 2018-08-19 DIAGNOSIS — J4521 Mild intermittent asthma with (acute) exacerbation: Secondary | ICD-10-CM

## 2018-08-19 MED ORDER — ALBUTEROL SULFATE HFA 108 (90 BASE) MCG/ACT IN AERS
2.0000 | INHALATION_SPRAY | Freq: Once | RESPIRATORY_TRACT | Status: AC
  Administered 2018-08-19: 2 via RESPIRATORY_TRACT

## 2018-08-19 MED ORDER — PREDNISONE 20 MG PO TABS: 20.0000 mg | ORAL_TABLET | Freq: Two times a day (BID) | ORAL | 0 refills | Status: AC

## 2018-08-19 MED ORDER — ALBUTEROL SULFATE HFA 108 (90 BASE) MCG/ACT IN AERS
INHALATION_SPRAY | RESPIRATORY_TRACT | Status: AC
  Filled 2018-08-19: qty 6.7

## 2018-08-19 MED ORDER — IPRATROPIUM-ALBUTEROL 0.5-2.5 (3) MG/3ML IN SOLN
RESPIRATORY_TRACT | Status: AC
  Filled 2018-08-19: qty 3

## 2018-08-19 MED ORDER — IPRATROPIUM-ALBUTEROL 0.5-2.5 (3) MG/3ML IN SOLN
3.0000 mL | Freq: Once | RESPIRATORY_TRACT | Status: AC
  Administered 2018-08-19: 3 mL via RESPIRATORY_TRACT

## 2018-08-19 NOTE — Discharge Instructions (Signed)
Duoneb given in office with marked improvement Albuterol inhaler prescribed.  Use as needed for shortness of breath and/or wheezing Prednisone prescribed.  Take as directed and to completion Follow up with PCP or with Joaquin Courts FNP (if you do not have a PCP) next week for recheck Return here or go to ER if you have any new or worsening symptoms such as shortness of breath, difficulty breathing, accessory muscle use, rib retraction, chest pain, coughing up blood, heart racing, feeling short of breath at rest, if symptoms do not improve with medication, etc..Marland Kitchen

## 2018-08-19 NOTE — ED Triage Notes (Signed)
Pt cc asthma flares x 1 week. Pt states she drank 2 Redbulls  and she started having palpations and then the asthma flared up.

## 2018-08-19 NOTE — ED Provider Notes (Signed)
[facility]   [id number] 08/19/18 Arrival Time: 1030  Cc: COUGH  SUBJECTIVE:  Sheila Estes is a 28 y.o. female hx significant for anxiety, asthma, bipolar disorder, depression, who presents with asthma flare and SOB x 3 weeks.  States symptoms occurred after drinking a red bull 3 weeks ago.  She experienced palpitations at that time and EMS came out to evaluate her.  EMS stated heart rate was elevated with normal EKG per patient.  Pt did not go to the ED at that time.  Denies chest pain and palpitations at this time. Describes cough as intermittent and non-productive.  Has tried inhaler without relief.  Symptoms are made worse with activitiy.  Reports previous symptoms in the past associated with asthma flare.   Denies fever, chills, fatigue, sinus pain, rhinorrhea, sore throat, wheezing, chest pain, nausea, changes in bowel or bladder habits.    Admits to smoking 1 black and mild a day for the past 6 months.  Recently quick when symptoms began.    Denies leg swelling or redness, recent long travel, hx of blood clots, OBC or hormone use, consistent tobacco use, or malignancy.    ROS: As per HPI.  Past Medical History:  Diagnosis Date  . Abnormal vaginal Pap smear   . Anxiety   . Asthma    HX - rarely uses inhaler - seasonal  . Bipolar 1 disorder (HCC)   . Depression   . History of depression   . History of trichomoniasis   . Hypertension   . Pneumonia    CHILDHOOD   Past Surgical History:  Procedure Laterality Date  . ARTHROSCOPTIC SHOULDER Left 2007  . CERVICAL CONIZATION W/BX N/A 12/14/2016   Procedure: COLD KNIFE CONIZATION, REMOVAL OF BODY JEWELRY;  Surgeon: Callery Bing, MD;  Location: [facility];  Service: Gynecology;  Laterality: N/A;   Allergies  Allergen Reactions  . Other Shortness Of Breath    Environmental allergies  . Gluten Meal     Gluten makes her wheeze   No current facility-administered medications on file prior to encounter.    Current  Outpatient Medications on File Prior to Encounter  Medication Sig Dispense Refill  . albuterol (PROVENTIL HFA;VENTOLIN HFA) 108 (90 Base) MCG/ACT inhaler Inhale 2 puffs into the lungs every 6 (six) hours as needed for wheezing or shortness of breath.    Marland Kitchen buPROPion (WELLBUTRIN) 100 MG tablet Take 100 mg by mouth 2 (two) times daily.     . citalopram (CELEXA) 20 MG tablet Take 20 mg by mouth daily.    Marland Kitchen ibuprofen (ADVIL,MOTRIN) 200 MG tablet Take 200 mg by mouth daily as needed for headache.     . loratadine (CLARITIN) 10 MG tablet Take 10 mg by mouth daily as needed for allergies.      Social History   Socioeconomic History  . Marital status: Single    Spouse name: Not on file  . Number of children: Not on file  . Years of education: Not on file  . Highest education level: Not on file  Occupational History  . Not on file  Social Needs  . Financial resource strain: Not on file  . Food insecurity:    Worry: Not on file    Inability: Not on file  . Transportation needs:    Medical: Not on file    Non-medical: Not on file  Tobacco Use  . Smoking status: Never Smoker  . Smokeless tobacco: Never Used  Substance and Sexual Activity  .  Alcohol use: Yes    Comment: socially wine  . Drug use: No  . Sexual activity: Yes    Birth control/protection: Condom  Lifestyle  . Physical activity:    Days per week: Not on file    Minutes per session: Not on file  . Stress: Not on file  Relationships  . Social connections:    Talks on phone: Not on file    Gets together: Not on file    Attends religious service: Not on file    Active member of club or organization: Not on file    Attends meetings of clubs or organizations: Not on file    Relationship status: Not on file  . Intimate partner violence:    Fear of current or ex partner: Not on file    Emotionally abused: Not on file    Physically abused: Not on file    Forced sexual activity: Not on file  Other Topics Concern  . Not on  file  Social History Narrative  . Not on file   Family History  Problem Relation Age of Onset  . Hypertension Mother   . Hypertension Father   . Diabetes Maternal Grandmother   . Hypertension Maternal Grandfather      OBJECTIVE:  Vitals:   08/19/18 1120 08/19/18 1124  BP:  100/80  Pulse:  96  Resp:  18  Temp:  98 F (36.7 C)  TempSrc:  Oral  SpO2:  97%  Weight: 200 lb (90.7 kg)      General appearance: Alert, well-appearing, nontoxic; speaking in full sentences without difficulty HEENT:NCAT; Ears: EACs clear, TMs pearly gray; Eyes: PERRL.  EOM grossly intact. Nose: nares patent without rhinorrhea; Throat: tonsils nonerythematous or enlarged, uvula midline  Neck: supple without LAD Lungs: clear to auscultation bilaterally without adventitious breath sounds; normal respiratory effort; mild cough present Heart: regular rate and rhythm.  Radial pulses 2+ symmetrical bilaterally Skin: warm and dry Psychological: alert and cooperative; normal mood and affect  ASSESSMENT & PLAN:  1. Mild intermittent asthma with exacerbation     Meds ordered this encounter  Medications  . ipratropium-albuterol (DUONEB) 0.5-2.5 (3) MG/3ML nebulizer solution 3 mL  . albuterol (PROVENTIL HFA;VENTOLIN HFA) 108 (90 Base) MCG/ACT inhaler 2 puff  . predniSONE (DELTASONE) 20 MG tablet    Sig: Take 1 tablet (20 mg total) by mouth 2 (two) times daily with a meal for 5 days.    Dispense:  10 tablet    Refill:  0    Order Specific Question:   Supervising Provider    Answer:   Eustace Moore [[id number]]    Duoneb given in office with marked improvement Albuterol inhaler prescribed.  Use as needed for shortness of breath and/or wheezing Prednisone prescribed.  Take as directed and to completion Follow up with PCP or with Joaquin Courts FNP (if you do not have a PCP) next week for recheck Return here or go to ER if you have any new or worsening symptoms such as shortness of breath, difficulty  breathing, accessory muscle use, rib retraction, chest pain, coughing up blood, heart racing, feeling short of breath at rest, if symptoms do not improve with medication, etc...  Reviewed expectations re: course of current medical issues. Questions answered. Outlined signs and symptoms indicating need for more acute intervention. Patient verbalized understanding. After Visit Summary given.          Rennis Harding, PA-C 08/19/18 1355

## 2018-08-20 ENCOUNTER — Emergency Department (HOSPITAL_COMMUNITY): Payer: Medicaid Other

## 2018-08-20 ENCOUNTER — Emergency Department (HOSPITAL_COMMUNITY)
Admission: EM | Admit: 2018-08-20 | Discharge: 2018-08-20 | Disposition: A | Discharge status: Home / Self Care | Payer: Medicaid Other | Attending: Emergency Medicine | Admitting: Emergency Medicine

## 2018-08-20 ENCOUNTER — Other Ambulatory Visit: Payer: Self-pay

## 2018-08-20 ENCOUNTER — Encounter (HOSPITAL_COMMUNITY): Payer: Self-pay | Admitting: Emergency Medicine

## 2018-08-20 DIAGNOSIS — I10 Essential (primary) hypertension: Secondary | ICD-10-CM | POA: Insufficient documentation

## 2018-08-20 DIAGNOSIS — J189 Pneumonia, unspecified organism: Secondary | ICD-10-CM

## 2018-08-20 DIAGNOSIS — J45909 Unspecified asthma, uncomplicated: Secondary | ICD-10-CM | POA: Insufficient documentation

## 2018-08-20 DIAGNOSIS — Z79899 Other long term (current) drug therapy: Secondary | ICD-10-CM | POA: Insufficient documentation

## 2018-08-20 MED ORDER — SODIUM CHLORIDE 0.9% FLUSH: 3.0000 mL | Freq: Once | INTRAVENOUS | Status: DC

## 2018-08-20 MED ORDER — BENZONATATE 100 MG PO CAPS: 200.0000 mg | ORAL_CAPSULE | Freq: Two times a day (BID) | ORAL | 0 refills | Status: DC | PRN

## 2018-08-20 MED ORDER — AMOXICILLIN-POT CLAVULANATE 875-125 MG PO TABS: 1.0000 | ORAL_TABLET | Freq: Two times a day (BID) | ORAL | 0 refills | Status: DC

## 2018-08-20 MED ORDER — AMOXICILLIN-POT CLAVULANATE 875-125 MG PO TABS
1.0000 | ORAL_TABLET | Freq: Once | ORAL | Status: AC
  Administered 2018-08-20: 1 via ORAL
  Filled 2018-08-20: qty 1

## 2018-08-20 NOTE — Discharge Instructions (Signed)
Take antibiotics as directed. If you do not feel any better or get worse in the next 2-3 days please return to the ER.

## 2018-08-20 NOTE — ED Provider Notes (Signed)
[facility]-EMERGENCY DEPT Provider Note   CSN: [id number] Arrival date & time: 08/20/18  [id number]     History   Chief Complaint Chief Complaint  Patient presents with  . Shortness of Breath    HPI Sheila Estes is a 28 y.o. female.  Patient presents to the emergency department with a chief complaint of cough, chest pain, shortness of breath.  She states that she has been having the symptoms for the past week or 2.  She thought that her symptoms originally started after having an energy drink and she thought that it caused her to have an asthma exacerbation.  She was seen recently at urgent care and was given a breathing treatment with some relief.  She denies any fever or chills.  She states that her cough has been somewhat productive.  She denies any calf pain or leg swelling.  Denies any history of PE or DVT.  The history is provided by the patient. No language interpreter was used.    Past Medical History:  Diagnosis Date  . Abnormal vaginal Pap smear   . Anxiety   . Asthma    HX - rarely uses inhaler - seasonal  . Bipolar 1 disorder (HCC)   . Depression   . History of depression   . History of trichomoniasis   . Hypertension   . Pneumonia    CHILDHOOD    Patient Active Problem List   Diagnosis Date Noted  . 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 Poor compliance 08/17/2016  . Abnormal Pap smear of cervix 08/17/2016  . BMI 40.0-44.9, adult (HCC) 08/17/2016    Past Surgical History:  Procedure Laterality Date  . ARTHROSCOPTIC SHOULDER Left 2007  . CERVICAL CONIZATION W/BX N/A 12/14/2016   Procedure: COLD KNIFE CONIZATION, REMOVAL OF BODY JEWELRY;  Surgeon: [place] BingPickens, Charlie, MD;  Location: [facility];  Service: Gynecology;  Laterality: N/A;     OB History    Gravida  1   Para  1   Term  1   Preterm      AB      Living  1     SAB      TAB      Ectopic      Multiple      Live Births  1        Obstetric  Comments  SVD x 1         Home Medications    Prior to Admission medications   Medication Sig Start Date End Date Taking? Authorizing Provider  albuterol (PROVENTIL HFA;VENTOLIN HFA) 108 (90 Base) MCG/ACT inhaler Inhale 2 puffs into the lungs every 6 (six) hours as needed for wheezing or shortness of breath.    [provider]  buPROPion (WELLBUTRIN) 100 MG tablet Take 100 mg by mouth 2 (two) times daily.     [provider]  citalopram (CELEXA) 20 MG tablet Take 20 mg by mouth daily.    [provider]  ibuprofen (ADVIL,MOTRIN) 200 MG tablet Take 200 mg by mouth daily as needed for headache.     [provider]  loratadine (CLARITIN) 10 MG tablet Take 10 mg by mouth daily as needed for allergies.    [provider]  predniSONE (DELTASONE) 20 MG tablet Take 1 tablet (20 mg total) by mouth 2 (two) times daily with a meal for 5 days. 08/19/18 08/24/18  Rennis HardingWurst, Brittany, PA-C    Family History Family History  Problem Relation  Age of Onset  . Hypertension Mother   . Hypertension Father   . Diabetes Maternal Grandmother   . Hypertension Maternal Grandfather     Social History Social History   Tobacco Use  . Smoking status: Never Smoker  . Smokeless tobacco: Never Used  Substance Use Topics  . Alcohol use: Yes    Comment: socially wine  . Drug use: No     Allergies   Other and Gluten meal   Review of Systems Review of Systems  All other systems reviewed and are negative.    Physical Exam Updated Vital Signs BP 126/81 (BP Location: Left Arm)   Pulse (!) 109   Temp 98 F (36.7 C) (Oral)   Resp 20   Ht 5\' 1"  (1.549 m)   Wt 90.7 kg   LMP 08/05/2018   SpO2 97%   BMI 37.79 kg/m   Physical Exam Vitals signs and nursing note reviewed.  Constitutional:      Appearance: She is well-developed.  HENT:     Head: Normocephalic and atraumatic.  Eyes:     Conjunctiva/sclera: Conjunctivae normal.     Pupils: Pupils are equal,  round, and reactive to light.  Neck:     Musculoskeletal: Normal range of motion and neck supple.  Cardiovascular:     Rate and Rhythm: Normal rate and regular rhythm.     Heart sounds: No murmur. No friction rub. No gallop.   Pulmonary:     Effort: Pulmonary effort is normal. No respiratory distress.     Breath sounds: Normal breath sounds. No wheezing or rales.  Chest:     Chest wall: No tenderness.  Abdominal:     General: Bowel sounds are normal. There is no distension.     Palpations: Abdomen is soft. There is no mass.     Tenderness: There is no abdominal tenderness. There is no guarding or rebound.  Musculoskeletal: Normal range of motion.        General: No tenderness.  Skin:    General: Skin is warm and dry.  Neurological:     Mental Status: She is alert and oriented to person, place, and time.  Psychiatric:        Behavior: Behavior normal.        Thought Content: Thought content normal.        Judgment: Judgment normal.      ED Treatments / Results  Labs (all labs ordered are listed, but only abnormal results are displayed) Labs Reviewed - No data to display  EKG None  Radiology Dg Chest 2 View  Result Date: 08/20/2018 CLINICAL DATA:  Chest pressure and shortness of breath EXAM: CHEST - 2 VIEW COMPARISON:  April 23, 2008 FINDINGS: There is patchy airspace opacity in both upper lobes and right lower lobe regions. Heart is upper normal in size with pulmonary vascularity within normal limits. No adenopathy. No bone lesions. IMPRESSION: Patchy airspace opacity bilaterally, likely representing multifocal pneumonia. A degree of noncardiogenic edema is a differential consideration. No adenopathy evident. Heart upper normal in size. Electronically Signed   By: Bretta Bang III M.D.   On: 08/20/2018 04:34    Procedures Procedures (including critical care time)  Medications Ordered in ED Medications  amoxicillin-clavulanate (AUGMENTIN) 875-125 MG per tablet 1  tablet (has no administration in time range)     Initial Impression / Assessment and Plan / ED Course  I have reviewed the triage vital signs and the nursing notes.  Pertinent labs &  imaging results that were available during my care of the patient were reviewed by me and considered in my medical decision making (see chart for details).     Patient with cough, chest pain, shortness of breath.  Symptoms have been ongoing for little over a week.  She has had some productive cough.  She is noted to be mildly tachycardic at 109 in triage.  Her O2 saturation is 97%.  She is in no acute distress on my exam.  She does not have any wheezing.  Chest x-ray shows patchy airspace opacities bilaterally likely representing multifocal pneumonia.  Will treat with Augmentin.  Recommend close follow-up with PCP for recheck in the emergency department and 2 days if not significantly improved. patient understands and agrees with the plan.  She is stable and ready for discharge.  Final Clinical Impressions(s) / ED Diagnoses   Final diagnoses:  Multifocal pneumonia    ED Discharge Orders         Ordered    amoxicillin-clavulanate (AUGMENTIN) 875-125 MG tablet  Every 12 hours     08/20/18 0501    benzonatate (TESSALON) 100 MG capsule  2 times daily PRN     08/20/18 0501           Roxy Horseman, PA-C 08/20/18 0503    Devoria Albe, MD 08/20/18 [phone]

## 2018-08-20 NOTE — ED Triage Notes (Signed)
Patient wants a chest xray because she drunk a red bull and felt her beating fast. Patient states every since she drunk the red bull she has had trouble with her breathing.

## 2018-08-28 ENCOUNTER — Emergency Department (HOSPITAL_COMMUNITY): Payer: Self-pay

## 2018-08-28 ENCOUNTER — Inpatient Hospital Stay (HOSPITAL_COMMUNITY)
Admission: EM | Admit: 2018-08-28 | Discharge: 2018-09-04 | DRG: 286 | Disposition: A | Discharge status: Home / Self Care | Payer: Self-pay | Attending: Cardiology | Admitting: Cardiology

## 2018-08-28 ENCOUNTER — Encounter (HOSPITAL_COMMUNITY): Payer: Self-pay | Admitting: Emergency Medicine

## 2018-08-28 ENCOUNTER — Other Ambulatory Visit: Payer: Self-pay

## 2018-08-28 DIAGNOSIS — R7989 Other specified abnormal findings of blood chemistry: Secondary | ICD-10-CM

## 2018-08-28 DIAGNOSIS — I252 Old myocardial infarction: Secondary | ICD-10-CM

## 2018-08-28 DIAGNOSIS — Z79899 Other long term (current) drug therapy: Secondary | ICD-10-CM

## 2018-08-28 DIAGNOSIS — I5021 Acute systolic (congestive) heart failure: Secondary | ICD-10-CM

## 2018-08-28 DIAGNOSIS — I5041 Acute combined systolic (congestive) and diastolic (congestive) heart failure: Secondary | ICD-10-CM

## 2018-08-28 DIAGNOSIS — I255 Ischemic cardiomyopathy: Secondary | ICD-10-CM | POA: Diagnosis present

## 2018-08-28 DIAGNOSIS — Z91018 Allergy to other foods: Secondary | ICD-10-CM

## 2018-08-28 DIAGNOSIS — I509 Heart failure, unspecified: Secondary | ICD-10-CM

## 2018-08-28 DIAGNOSIS — I236 Thrombosis of atrium, auricular appendage, and ventricle as current complications following acute myocardial infarction: Secondary | ICD-10-CM | POA: Diagnosis present

## 2018-08-28 DIAGNOSIS — R9389 Abnormal findings on diagnostic imaging of other specified body structures: Secondary | ICD-10-CM

## 2018-08-28 DIAGNOSIS — Z5181 Encounter for therapeutic drug level monitoring: Secondary | ICD-10-CM

## 2018-08-28 DIAGNOSIS — I240 Acute coronary thrombosis not resulting in myocardial infarction: Secondary | ICD-10-CM | POA: Diagnosis present

## 2018-08-28 DIAGNOSIS — F319 Bipolar disorder, unspecified: Secondary | ICD-10-CM | POA: Diagnosis present

## 2018-08-28 DIAGNOSIS — I2542 Coronary artery dissection: Secondary | ICD-10-CM | POA: Diagnosis present

## 2018-08-28 DIAGNOSIS — J45909 Unspecified asthma, uncomplicated: Secondary | ICD-10-CM | POA: Diagnosis present

## 2018-08-28 DIAGNOSIS — R0602 Shortness of breath: Secondary | ICD-10-CM

## 2018-08-28 DIAGNOSIS — Z91048 Other nonmedicinal substance allergy status: Secondary | ICD-10-CM

## 2018-08-28 DIAGNOSIS — I371 Nonrheumatic pulmonary valve insufficiency: Secondary | ICD-10-CM

## 2018-08-28 DIAGNOSIS — I110 Hypertensive heart disease with heart failure: Principal | ICD-10-CM | POA: Diagnosis present

## 2018-08-28 DIAGNOSIS — Z6841 Body Mass Index (BMI) 40.0 and over, adult: Secondary | ICD-10-CM

## 2018-08-28 DIAGNOSIS — Z7901 Long term (current) use of anticoagulants: Secondary | ICD-10-CM

## 2018-08-28 DIAGNOSIS — F419 Anxiety disorder, unspecified: Secondary | ICD-10-CM | POA: Diagnosis present

## 2018-08-28 DIAGNOSIS — Z8249 Family history of ischemic heart disease and other diseases of the circulatory system: Secondary | ICD-10-CM

## 2018-08-28 HISTORY — DX: Thrombosis of atrium, auricular appendage, and ventricle as current complications following acute myocardial infarction: I23.6

## 2018-08-28 HISTORY — DX: Long term (current) use of anticoagulants: Z79.01

## 2018-08-28 HISTORY — DX: Acute systolic (congestive) heart failure: I50.21

## 2018-08-28 HISTORY — DX: Coronary artery dissection: I25.42

## 2018-08-28 HISTORY — DX: Encounter for therapeutic drug level monitoring: Z51.81

## 2018-08-28 LAB — I-STAT TROPONIN, ED: Troponin i, poc: 0.3 ng/mL (ref 0.00–0.08)

## 2018-08-28 LAB — BASIC METABOLIC PANEL
Anion gap: 8 (ref 5–15)
BUN: 8 mg/dL (ref 6–20)
CO2: 25 mmol/L (ref 22–32)
CREATININE: 0.76 mg/dL (ref 0.44–1.00)
Calcium: 8.8 mg/dL — ABNORMAL LOW (ref 8.9–10.3)
Chloride: 106 mmol/L (ref 98–111)
GFR calc Af Amer: >60 mL/min (ref >60)
GFR calc non Af Amer: >60 mL/min (ref >60)
Glucose, Bld: 101 mg/dL — ABNORMAL HIGH (ref 70–99)
Potassium: 3.8 mmol/L (ref 3.5–5.1)
Sodium: 139 mmol/L (ref 135–145)

## 2018-08-28 LAB — I-STAT BETA HCG BLOOD, ED (MC, WL, AP ONLY): I-stat hCG, quantitative: <5 m[IU]/mL (ref <5)

## 2018-08-28 LAB — CBC WITH DIFFERENTIAL/PLATELET
Abs Immature Granulocytes: 0.01 10*3/uL (ref 0.00–0.07)
Basophils Absolute: 0 10*3/uL (ref 0.0–0.1)
Basophils Relative: 1 %
EOS PCT: 6 %
Eosinophils Absolute: 0.5 10*3/uL (ref 0.0–0.5)
HEMATOCRIT: 35.7 % — AB (ref 36.0–46.0)
Hemoglobin: 11.7 g/dL — ABNORMAL LOW (ref 12.0–15.0)
Immature Granulocytes: 0 %
LYMPHS ABS: 2.1 10*3/uL (ref 0.7–4.0)
Lymphocytes Relative: 27 %
MCH: 29.7 pg (ref 26.0–34.0)
MCHC: 32.8 g/dL (ref 30.0–36.0)
MCV: 90.6 fL (ref 80.0–100.0)
Monocytes Absolute: 0.5 10*3/uL (ref 0.1–1.0)
Monocytes Relative: 6 %
Neutro Abs: 4.8 10*3/uL (ref 1.7–7.7)
Neutrophils Relative %: 60 %
Platelets: 442 10*3/uL — ABNORMAL HIGH (ref 150–400)
RBC: 3.94 MIL/uL (ref 3.87–5.11)
RDW: 12.9 % (ref 11.5–15.5)
WBC: 7.9 10*3/uL (ref 4.0–10.5)
nRBC: 0 % (ref 0.0–0.2)

## 2018-08-28 LAB — HEPATIC FUNCTION PANEL
ALT: 16 U/L (ref 0–44)
AST: 15 U/L (ref 15–41)
Albumin: 3.1 g/dL — ABNORMAL LOW (ref 3.5–5.0)
Alkaline Phosphatase: 33 U/L — ABNORMAL LOW (ref 38–126)
Bilirubin, Direct: 0.2 mg/dL (ref 0.0–0.2)
Indirect Bilirubin: 0.5 mg/dL (ref 0.3–0.9)
Total Bilirubin: 0.7 mg/dL (ref 0.3–1.2)
Total Protein: 7.2 g/dL (ref 6.5–8.1)

## 2018-08-28 LAB — ECHOCARDIOGRAM COMPLETE
Height: 61 in
Weight: 3199.32 oz

## 2018-08-28 LAB — D-DIMER, QUANTITATIVE: D-Dimer, Quant: 6.52 ug/mL-FEU — ABNORMAL HIGH (ref 0.00–0.50)

## 2018-08-28 LAB — TROPONIN I: Troponin I: 0.33 ng/mL (ref <0.03)

## 2018-08-28 LAB — BRAIN NATRIURETIC PEPTIDE: B Natriuretic Peptide: 639.6 pg/mL — ABNORMAL HIGH (ref 0.0–100.0)

## 2018-08-28 MED ORDER — FUROSEMIDE 10 MG/ML IJ SOLN: 40.0000 mg | Freq: Two times a day (BID) | INTRAMUSCULAR | Status: DC

## 2018-08-28 MED ORDER — ALPRAZOLAM 0.25 MG PO TABS
0.2500 mg | ORAL_TABLET | Freq: Two times a day (BID) | ORAL | Status: DC | PRN
  Administered 2018-08-28 – 2018-09-02 (×3): 0.25 mg via ORAL
  Filled 2018-08-28 (×3): qty 1

## 2018-08-28 MED ORDER — ASPIRIN 81 MG PO CHEW
324.0000 mg | CHEWABLE_TABLET | Freq: Once | ORAL | Status: AC
  Administered 2018-08-28: 324 mg via ORAL
  Filled 2018-08-28: qty 4

## 2018-08-28 MED ORDER — IOPAMIDOL (ISOVUE-370) INJECTION 76%
INTRAVENOUS | Status: AC
  Filled 2018-08-28: qty 100

## 2018-08-28 MED ORDER — ALBUTEROL SULFATE (2.5 MG/3ML) 0.083% IN NEBU: 3.0000 mL | INHALATION_SOLUTION | Freq: Four times a day (QID) | RESPIRATORY_TRACT | Status: DC | PRN

## 2018-08-28 MED ORDER — ONDANSETRON HCL 4 MG/2ML IJ SOLN: 4.0000 mg | Freq: Four times a day (QID) | INTRAMUSCULAR | Status: DC | PRN

## 2018-08-28 MED ORDER — SODIUM CHLORIDE 0.9 % IV SOLN: 250.0000 mL | INTRAVENOUS | Status: DC | PRN

## 2018-08-28 MED ORDER — PERFLUTREN LIPID MICROSPHERE
1.0000 mL | INTRAVENOUS | Status: AC | PRN
  Administered 2018-08-28: 3 mL via INTRAVENOUS
  Filled 2018-08-28: qty 10

## 2018-08-28 MED ORDER — HEPARIN BOLUS VIA INFUSION
5000.0000 [IU] | Freq: Once | INTRAVENOUS | Status: AC
  Administered 2018-08-28: 5000 [IU] via INTRAVENOUS
  Filled 2018-08-28: qty 5000

## 2018-08-28 MED ORDER — BUPROPION HCL 100 MG PO TABS
100.0000 mg | ORAL_TABLET | Freq: Two times a day (BID) | ORAL | Status: DC
  Filled 2018-08-28 (×4): qty 1

## 2018-08-28 MED ORDER — IPRATROPIUM-ALBUTEROL 0.5-2.5 (3) MG/3ML IN SOLN
3.0000 mL | Freq: Once | RESPIRATORY_TRACT | Status: AC
  Administered 2018-08-28: 3 mL via RESPIRATORY_TRACT
  Filled 2018-08-28: qty 3

## 2018-08-28 MED ORDER — SODIUM CHLORIDE 0.9% FLUSH
3.0000 mL | Freq: Two times a day (BID) | INTRAVENOUS | Status: DC
  Administered 2018-08-28 – 2018-09-03 (×6): 3 mL via INTRAVENOUS

## 2018-08-28 MED ORDER — SODIUM CHLORIDE 0.9% FLUSH: 3.0000 mL | INTRAVENOUS | Status: DC | PRN

## 2018-08-28 MED ORDER — ASPIRIN EC 81 MG PO TBEC
81.0000 mg | DELAYED_RELEASE_TABLET | Freq: Every day | ORAL | Status: DC
  Administered 2018-08-29 – 2018-09-04 (×6): 81 mg via ORAL
  Filled 2018-08-28 (×6): qty 1

## 2018-08-28 MED ORDER — CITALOPRAM HYDROBROMIDE 20 MG PO TABS
20.0000 mg | ORAL_TABLET | Freq: Every day | ORAL | Status: DC
  Filled 2018-08-28 (×3): qty 1

## 2018-08-28 MED ORDER — HEPARIN (PORCINE) 25000 UT/250ML-% IV SOLN
1500.0000 [IU]/h | INTRAVENOUS | Status: DC
  Administered 2018-08-28: 1200 [IU]/h via INTRAVENOUS
  Administered 2018-08-29 – 2018-08-30 (×2): 1500 [IU]/h via INTRAVENOUS
  Filled 2018-08-28 (×3): qty 250

## 2018-08-28 MED ORDER — BENZONATATE 100 MG PO CAPS: 200.0000 mg | ORAL_CAPSULE | Freq: Two times a day (BID) | ORAL | Status: DC | PRN

## 2018-08-28 MED ORDER — LORATADINE 10 MG PO TABS
10.0000 mg | ORAL_TABLET | Freq: Every day | ORAL | Status: DC | PRN
  Administered 2018-09-03: 10 mg via ORAL
  Filled 2018-08-28: qty 1

## 2018-08-28 MED ORDER — FUROSEMIDE 10 MG/ML IJ SOLN
40.0000 mg | Freq: Four times a day (QID) | INTRAMUSCULAR | Status: DC
  Administered 2018-08-28 – 2018-08-29 (×2): 40 mg via INTRAVENOUS
  Filled 2018-08-28 (×2): qty 4

## 2018-08-28 MED ORDER — FUROSEMIDE 10 MG/ML IJ SOLN
40.0000 mg | Freq: Once | INTRAMUSCULAR | Status: AC
  Administered 2018-08-28: 40 mg via INTRAVENOUS
  Filled 2018-08-28: qty 4

## 2018-08-28 MED ORDER — ACETAMINOPHEN 325 MG PO TABS
650.0000 mg | ORAL_TABLET | ORAL | Status: DC | PRN
  Filled 2018-08-28: qty 2

## 2018-08-28 MED ORDER — SODIUM CHLORIDE (PF) 0.9 % IJ SOLN
INTRAMUSCULAR | Status: AC
  Filled 2018-08-28: qty 50

## 2018-08-28 MED ORDER — IOPAMIDOL (ISOVUE-370) INJECTION 76%
100.0000 mL | Freq: Once | INTRAVENOUS | Status: AC | PRN
  Administered 2018-08-28: 100 mL via INTRAVENOUS

## 2018-08-28 NOTE — ED Notes (Signed)
By verbalizes using 1 and 1/2 inhalers in 24 hrs

## 2018-08-28 NOTE — ED Notes (Signed)
Patient transported to CT 

## 2018-08-28 NOTE — ED Provider Notes (Signed)
[facility]-EMERGENCY DEPT Provider Note   CSN: [id number] Arrival date & time: 08/28/18  9201    History   Chief Complaint Chief Complaint  Patient presents with  . Shortness of Breath    HPI Sheila Estes is a 28 y.o. female who presents with shortness of breath.  Past medical history significant for asthma, bipolar d/o, recent history of multifocal pneumonia.  Patient states that she has been short of breath for the past several weeks. She went to UC on 2/15 and was treated for asthma exacerbation. She was here last week and was diagnosed with pneumonia and was prescribed Augmentin.  She finished the full course of the antibiotic yesterday.  She states that she feels like she has been improving but she is not totally better.  She has been using her inhaler frequently.  She gets very short of breath with exertion.  She denies fever, lightheadedness, syncope, chest pain, leg swelling or calf pain.  She states she feels disoriented at times and has a decreased appetite.  She still continues to have a dry cough and wheezing. No recent surgery/travel/immobilization, hx of cancer, hemoptysis, or hormone use. She denies drug use or vaping. She reports heavy caffeine use but hasn't been doing this recently.    HPI  Past Medical History:  Diagnosis Date  . Abnormal vaginal Pap smear   . Anxiety   . Asthma    HX - rarely uses inhaler - seasonal  . Bipolar 1 disorder (HCC)   . Depression   . History of depression   . History of trichomoniasis   . Hypertension   . Pneumonia    CHILDHOOD    Patient Active Problem List   Diagnosis Date Noted  . 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 Poor compliance 08/17/2016  . Abnormal Pap smear of cervix 08/17/2016  . BMI 40.0-44.9, adult (HCC) 08/17/2016    Past Surgical History:  Procedure Laterality Date  . ARTHROSCOPTIC SHOULDER Left 2007  . CERVICAL CONIZATION W/BX N/A 12/14/2016   Procedure:  COLD KNIFE CONIZATION, REMOVAL OF BODY JEWELRY;  Surgeon:  Bing, MD;  Location: [facility];  Service: Gynecology;  Laterality: N/A;     OB History    Gravida  1   Para  1   Term  1   Preterm      AB      Living  1     SAB      TAB      Ectopic      Multiple      Live Births  1        Obstetric Comments  SVD x 1         Home Medications    Prior to Admission medications   Medication Sig Start Date End Date Taking? Authorizing Provider  albuterol (PROVENTIL HFA;VENTOLIN HFA) 108 (90 Base) MCG/ACT inhaler Inhale 2 puffs into the lungs every 6 (six) hours as needed for wheezing or shortness of breath.    [provider]  amoxicillin-clavulanate (AUGMENTIN) 875-125 MG tablet Take 1 tablet by mouth every 12 (twelve) hours. 08/20/18   Roxy Horseman, PA-C  benzonatate (TESSALON) 100 MG capsule Take 2 capsules (200 mg total) by mouth 2 (two) times daily as needed for cough. 08/20/18   Roxy Horseman, PA-C  buPROPion (WELLBUTRIN) 100 MG tablet Take 100 mg by mouth 2 (two) times daily.     [provider]  citalopram (CELEXA) 20 MG tablet Take  20 mg by mouth daily.    [provider]  ibuprofen (ADVIL,MOTRIN) 200 MG tablet Take 200 mg by mouth daily as needed for headache.     [provider]  loratadine (CLARITIN) 10 MG tablet Take 10 mg by mouth daily as needed for allergies.    [provider]    Family History Family History  Problem Relation Age of Onset  . Hypertension Mother   . Hypertension Father   . Diabetes Maternal Grandmother   . Hypertension Maternal Grandfather     Social History Social History   Tobacco Use  . Smoking status: Never Smoker  . Smokeless tobacco: Never Used  Substance Use Topics  . Alcohol use: Yes    Comment: socially wine  . Drug use: No     Allergies   Other and Gluten meal   Review of Systems Review of Systems  Constitutional: Positive for activity change and  appetite change. Negative for chills and fever.  HENT: Negative for congestion.   Respiratory: Positive for cough, shortness of breath and wheezing.   Cardiovascular: Negative for chest pain and leg swelling.  Neurological: Negative for syncope.  Psychiatric/Behavioral: Positive for sleep disturbance.  All other systems reviewed and are negative.    Physical Exam Updated Vital Signs BP 135/77 (BP Location: Left Arm)   Pulse (!) 110   Temp 98.4 F (36.9 C) (Oral)   Resp 18   Ht  (1.549 m)   Wt 90.7 kg   LMP 08/25/2018   SpO2 100%   BMI 37.78 kg/m   Physical Exam Vitals signs and nursing note reviewed.  Constitutional:      General: She is not in acute distress.    Appearance: She is well-developed.     Interventions: She is not intubated.    Comments: Calm, cooperative. Able to talk in full sentences without difficulty  HENT:     Head: Normocephalic and atraumatic.  Eyes:     General: No scleral icterus.       Right eye: No discharge.        Left eye: No discharge.     Conjunctiva/sclera: Conjunctivae normal.     Pupils: Pupils are equal, round, and reactive to light.  Neck:     Musculoskeletal: Normal range of motion.  Cardiovascular:     Rate and Rhythm: Normal rate.  Pulmonary:     Effort: Pulmonary effort is normal. No tachypnea, bradypnea, accessory muscle usage or respiratory distress. She is not intubated.     Breath sounds: Normal breath sounds. No stridor.  Chest:     Chest wall: No tenderness.  Abdominal:     General: There is no distension.  Musculoskeletal:     Right lower leg: She exhibits no tenderness. No edema.     Left lower leg: She exhibits no tenderness. No edema.  Skin:    General: Skin is warm and dry.  Neurological:     Mental Status: She is alert and oriented to person, place, and time.  Psychiatric:        Behavior: Behavior normal.      ED Treatments / Results  Labs (all labs ordered are listed, but only abnormal results  are displayed) Labs Reviewed  BASIC METABOLIC PANEL - Abnormal; Notable for the following components:      Result Value   Glucose, Bld 101 (*)    Calcium 8.8 (*)    All other components within normal limits  CBC WITH DIFFERENTIAL/PLATELET -  Abnormal; Notable for the following components:   Hemoglobin 11.7 (*)    HCT 35.7 (*)    Platelets 442 (*)    All other components within normal limits  D-DIMER, QUANTITATIVE (NOT AT [facility]) - Abnormal; Notable for the following components:   D-Dimer, Quant 6.52 (*)    All other components within normal limits  BRAIN NATRIURETIC PEPTIDE - Abnormal; Notable for the following components:   B Natriuretic Peptide 639.6 (*)    All other components within normal limits  HEPATIC FUNCTION PANEL - Abnormal; Notable for the following components:   Albumin 3.1 (*)    Alkaline Phosphatase 33 (*)    All other components within normal limits  I-STAT TROPONIN, ED - Abnormal; Notable for the following components:   Troponin i, poc 0.30 (*)    All other components within normal limits  I-STAT BETA HCG BLOOD, ED ([facility], [facility], [facility] ONLY)    EKG None  Radiology Dg Chest 2 View  Result Date: 08/28/2018 CLINICAL DATA:  Short of breath EXAM: CHEST - 2 VIEW COMPARISON:  08/20/2018 FINDINGS: Significant progression of diffuse bilateral airspace disease which is symmetric and spares the periphery. Mild cardiac enlargement. No pleural effusion. No collapse. No acute skeletal abnormality. IMPRESSION: Significant progression of symmetric bilateral airspace disease. Cardiac enlargement. This may represent cardiogenic or noncardiogenic edema. Infection also possible. Electronically Signed   By: Marlan Palau M.D.   On: 08/28/2018 08:03   Ct Angio Chest Pe W/cm &/or Wo Cm  Result Date: 08/28/2018 CLINICAL DATA:  Severe shortness of breath. EXAM: CT ANGIOGRAPHY CHEST WITH CONTRAST TECHNIQUE: Multidetector CT imaging of the chest was performed using the standard protocol during bolus  administration of intravenous contrast. Multiplanar CT image reconstructions and MIPs were obtained to evaluate the vascular anatomy. CONTRAST:  ISOVUE-370 IOPAMIDOL (ISOVUE-370) INJECTION 76% COMPARISON:  Chest x-ray from same day. FINDINGS: Cardiovascular: Satisfactory opacification of the pulmonary arteries to the segmental level. No evidence of pulmonary embolism. Mild cardiomegaly. Trace pericardial effusion. No thoracic aortic aneurysm or dissection. Mediastinum/Nodes: Prominent subcentimeter bilateral hilar lymph nodes are likely reactive. No enlarged mediastinal or axillary lymph nodes. Thyroid gland, trachea, and esophagus demonstrate no significant findings. Lungs/Pleura: Relatively symmetric, diffuse, central ground-glass and consolidative peribronchovascular densities in both lungs. Scattered mild interlobular septal thickening. Diffuse moderate to severe peribronchial thickening. No pneumothorax. Upper Abdomen: No acute abnormality. Musculoskeletal: No chest wall abnormality. No acute or significant osseous findings. Review of the MIP images confirms the above findings. IMPRESSION: 1. Relatively symmetric diffuse central ground-glass and consolidative peribronchovascular densities in both lungs may reflect cardiogenic pulmonary edema given mild cardiomegaly, interlobular septal thickening, and small bilateral pleural effusions. However, the differential also includes non-cardiogenic capillary leak pulmonary edema such as from prolonged hypotension, fat emboli syndrome, toxic inhalation, acute hypersensitivity pneumonitis, or drug reaction. Diffuse alveolar hemorrhage could have a similar appearance. 2.  No evidence of pulmonary embolism. Electronically Signed   By: Obie Dredge M.D.   On: 08/28/2018 09:57    Procedures Procedures (including critical care time)  Medications Ordered in ED Medications  sodium chloride (PF) 0.9 % injection (has no administration in time range)  iopamidol  (ISOVUE-370) 76 % injection (has no administration in time range)  aspirin chewable tablet 324 mg (has no administration in time range)  furosemide (LASIX) injection 40 mg (has no administration in time range)  ipratropium-albuterol (DUONEB) 0.5-2.5 (3) MG/3ML nebulizer solution 3 mL (3 mLs Nebulization Given 08/28/18 0736)  iopamidol (ISOVUE-370) 76 % injection 100 mL (  100 mLs Intravenous Contrast Given 08/28/18 1610)     Initial Impression / Assessment and Plan / ED Course  I have reviewed the triage vital signs and the nursing notes.  Pertinent labs & imaging results that were available during my care of the patient were reviewed by me and considered in my medical decision making (see chart for details).  28 year old female presents with SOB. She actually feels like it's improving but it's not totally better. She is tachycardic and hypertensive at times but otherwise vitals are normal. Heart rate is fast and regular. Lungs are CTA. Will obtain labs and repeat CXR.  CBC is remarkable for mild anemia. BMP is normal. CXR appears to be worse than before. D-dimer is elevated, will order CTA of chest.  CT chest shows "symmetric diffuse central ground-glass and consolidative peribronchovascular densities in both lungs may reflect cardiogenic pulmonary edema given mild cardiomegaly, interlobular septal thickening, and small bilateral pleural effusions. However, the differential also includes non-cardiogenic capillary leak pulmonary edema such as from prolonged hypotension, fat emboli syndrome, toxic inhalation, acute hypersensitivity pneumonitis, or drug reaction. Diffuse alveolar hemorrhage could have a similar appearance. 2.  No evidence of pulmonary embolism." Troponin, EKG, BNP ordered.  Troponin is .30. BNP is 639. EKG is sinus tachycardia. Shared visit with Dr. Antonietta Barcelona. Will consult cardiology.  Discussed with Dr. Delton See who will come to see the pt at [facility]. She prefers her to go to [facility] but  will see her here first.  Final Clinical Impressions(s) / ED Diagnoses   Final diagnoses:  Acute congestive heart failure, unspecified heart failure type [facility])    ED Discharge Orders    None       Bethel Born, PA-C 08/28/18 1516    Alvira Monday, MD 08/29/18 [phone]

## 2018-08-28 NOTE — Plan of Care (Signed)
  Problem: Clinical Measurements: Goal: Ability to maintain clinical measurements within normal limits will improve Outcome: Progressing   Problem: Clinical Measurements: Goal: Cardiovascular complication will be avoided Outcome: Progressing   Problem: Coping: Goal: Level of anxiety will decrease Outcome: Progressing   Problem: Safety: Goal: Ability to remain free from injury will improve Outcome: Progressing

## 2018-08-28 NOTE — ED Notes (Signed)
ED TO INPATIENT HANDOFF REPORT  Name/Age/Gender Sheila Estes 28 y.o. female  Code Status Code Status History    Date Active Date Inactive Code Status Order ID Comments User Context   07/06/2014 0042 07/06/2014 0530 Full Code [id number]  Arnoldo Hooker, PA-C ED      Home/SNF/Other Given to floor  Chief Complaint asthma  Level of Care/Admitting Diagnosis ED Disposition    ED Disposition Condition Comment   Admit  Hospital Area: [facility] [[id number]]  Level of Care: Cardiac Telemetry [103]  Diagnosis: CHF (congestive heart failure) [facility]) [[id number]]  Admitting Physician: Lars Masson [[id number]]  Attending Physician: Lars Masson [[id number]]  Estimated length of stay: past midnight tomorrow  Certification:: I certify this patient will need inpatient services for at least 2 midnights  PT Class (Do Not Modify): Inpatient [101]  PT Acc Code (Do Not Modify): Private [1]       Medical History Past Medical History:  Diagnosis Date  . Abnormal vaginal Pap smear   . Anxiety   . Asthma    HX - rarely uses inhaler - seasonal  . Bipolar 1 disorder (HCC)   . Depression   . History of depression   . History of trichomoniasis   . Hypertension   . Pneumonia    CHILDHOOD    Allergies Allergies  Allergen Reactions  . Other Shortness Of Breath    Environmental allergies  . Gluten Meal     Gluten makes her wheeze    IV Location/Drains/Wounds Patient Lines/Drains/Airways Status   Active Line/Drains/Airways    Name:   Placement date:   Placement time:   Site:   Days:   Peripheral IV 08/28/18 Left Antecubital   08/28/18    0725    Antecubital   less than 1   Incision (Closed) 12/14/16 Perineum Other (Comment)   12/14/16    0816     622          Labs/Imaging Results for orders placed or performed during the hospital encounter of 08/28/18 (from the past 48 hour(s))  Basic metabolic panel     Status: Abnormal   Collection Time: 08/28/18  7:27 AM   Result Value Ref Range   Sodium 139 135 - 145 mmol/L   Potassium 3.8 3.5 - 5.1 mmol/L   Chloride 106 98 - 111 mmol/L   CO2 25 22 - 32 mmol/L   Glucose, Bld 101 (H) 70 - 99 mg/dL   BUN 8 6 - 20 mg/dL   Creatinine, Ser 9.24 0.44 - 1.00 mg/dL   Calcium 8.8 (L) 8.9 - 10.3 mg/dL   GFR calc non Af Amer >60 >60 mL/min   GFR calc Af Amer >60 >60 mL/min   Anion gap 8 5 - 15    Comment: Performed at [facility], [address] [place], [place] [address]  CBC with Differential     Status: Abnormal   Collection Time: 08/28/18  7:27 AM  Result Value Ref Range   WBC 7.9 4.0 - 10.5 K/uL   RBC 3.94 3.87 - 5.11 MIL/uL   Hemoglobin 11.7 (L) 12.0 - 15.0 g/dL   HCT 19.6 (L) 22.2 - 97.9 %   MCV 90.6 80.0 - 100.0 fL   MCH 29.7 26.0 - 34.0 pg   MCHC 32.8 30.0 - 36.0 g/dL   RDW 89.2 11.9 - 41.7 %   Platelets 442 (H) 150 - 400 K/uL   nRBC 0.0 0.0 - 0.2 %  Neutrophils Relative % 60 %   Neutro Abs 4.8 1.7 - 7.7 K/uL   Lymphocytes Relative 27 %   Lymphs Abs 2.1 0.7 - 4.0 K/uL   Monocytes Relative 6 %   Monocytes Absolute 0.5 0.1 - 1.0 K/uL   Eosinophils Relative 6 %   Eosinophils Absolute 0.5 0.0 - 0.5 K/uL   Basophils Relative 1 %   Basophils Absolute 0.0 0.0 - 0.1 K/uL   Immature Granulocytes 0 %   Abs Immature Granulocytes 0.01 0.00 - 0.07 K/uL    Comment: Performed at [facility], [address] [place], [place] [address]  D-dimer, quantitative (not at [facility])     Status: Abnormal   Collection Time: 08/28/18  7:27 AM  Result Value Ref Range   D-Dimer, Quant 6.52 (H) 0.00 - 0.50 ug/mL-FEU    Comment: (NOTE) At the manufacturer cut-off of 0.50 ug/mL FEU, this assay has been documented to exclude PE with a sensitivity and negative predictive value of 97 to 99%.  At this time, this assay has not been approved by the FDA to exclude DVT/VTE. Results should be correlated with clinical presentation. Performed at [facility], [address] [place], [place] [address]   I-Stat beta hCG blood, ED     Status: None   Collection Time: 08/28/18  7:32 AM  Result Value Ref Range   I-stat hCG, quantitative <5.0 <5 mIU/mL   Comment 3            Comment:   GEST. AGE      CONC.  (mIU/mL)   <=1 WEEK        5 - 50     2 WEEKS       50 - 500     3 WEEKS       100 - 10,000     4 WEEKS     1,000 - 30,000        FEMALE AND NON-PREGNANT FEMALE:     LESS THAN 5 mIU/mL   Brain natriuretic peptide     Status: Abnormal   Collection Time: 08/28/18 10:31 AM  Result Value Ref Range   B Natriuretic Peptide 639.6 (H) 0.0 - 100.0 pg/mL    Comment: Performed at [facility], [address] [place], [place] [address]  Hepatic function panel     Status: Abnormal   Collection Time: 08/28/18 10:31 AM  Result Value Ref Range   Total Protein 7.2 6.5 - 8.1 g/dL   Albumin 3.1 (L) 3.5 - 5.0 g/dL   AST 15 15 - 41 U/L   ALT 16 0 - 44 U/L   Alkaline Phosphatase 33 (L) 38 - 126 U/L   Total Bilirubin 0.7 0.3 - 1.2 mg/dL   Bilirubin, Direct 0.2 0.0 - 0.2 mg/dL   Indirect Bilirubin 0.5 0.3 - 0.9 mg/dL    Comment: Performed at [facility], [address], [place], [place] [address]  I-Stat Troponin, ED (not at [facility])     Status: Abnormal   Collection Time: 08/28/18 10:41 AM  Result Value Ref Range   Troponin i, poc 0.30 (HH) 0.00 - 0.08 ng/mL   Comment NOTIFIED PHYSICIAN    Comment 3            Comment: Due to the release kinetics of cTnI, a negative result within the first hours of the onset of symptoms does not rule out myocardial infarction with certainty. If myocardial infarction is still  suspected, repeat the test at appropriate intervals.    Dg Chest 2 View  Result Date: 08/28/2018 CLINICAL DATA:  Short of breath EXAM: CHEST - 2 VIEW COMPARISON:  08/20/2018 FINDINGS: Significant progression of diffuse bilateral airspace disease which is symmetric and spares the periphery. Mild cardiac enlargement.  No pleural effusion. No collapse. No acute skeletal abnormality. IMPRESSION: Significant progression of symmetric bilateral airspace disease. Cardiac enlargement. This may represent cardiogenic or noncardiogenic edema. Infection also possible. Electronically Signed   By: Marlan Palau M.D.   On: 08/28/2018 08:03   Ct Angio Chest Pe W/cm &/or Wo Cm  Result Date: 08/28/2018 CLINICAL DATA:  Severe shortness of breath. EXAM: CT ANGIOGRAPHY CHEST WITH CONTRAST TECHNIQUE: Multidetector CT imaging of the chest was performed using the standard protocol during bolus administration of intravenous contrast. Multiplanar CT image reconstructions and MIPs were obtained to evaluate the vascular anatomy. CONTRAST:  ISOVUE-370 IOPAMIDOL (ISOVUE-370) INJECTION 76% COMPARISON:  Chest x-ray from same day. FINDINGS: Cardiovascular: Satisfactory opacification of the pulmonary arteries to the segmental level. No evidence of pulmonary embolism. Mild cardiomegaly. Trace pericardial effusion. No thoracic aortic aneurysm or dissection. Mediastinum/Nodes: Prominent subcentimeter bilateral hilar lymph nodes are likely reactive. No enlarged mediastinal or axillary lymph nodes. Thyroid gland, trachea, and esophagus demonstrate no significant findings. Lungs/Pleura: Relatively symmetric, diffuse, central ground-glass and consolidative peribronchovascular densities in both lungs. Scattered mild interlobular septal thickening. Diffuse moderate to severe peribronchial thickening. No pneumothorax. Upper Abdomen: No acute abnormality. Musculoskeletal: No chest wall abnormality. No acute or significant osseous findings. Review of the MIP images confirms the above findings. IMPRESSION: 1. Relatively symmetric diffuse central ground-glass and consolidative peribronchovascular densities in both lungs may reflect cardiogenic pulmonary edema given mild cardiomegaly, interlobular septal thickening, and small bilateral pleural effusions. However,  the differential also includes non-cardiogenic capillary leak pulmonary edema such as from prolonged hypotension, fat emboli syndrome, toxic inhalation, acute hypersensitivity pneumonitis, or drug reaction. Diffuse alveolar hemorrhage could have a similar appearance. 2.  No evidence of pulmonary embolism. Electronically Signed   By: Obie Dredge M.D.   On: 08/28/2018 09:57    Pending Labs [employer] (From admission, onward)    Start     Ordered   Signed and Held  HIV antibody (Routine Testing)  Once,   R     Signed and Held   Signed and Held  Basic metabolic panel  Daily,   R     Signed and Held          Vitals/Pain Today's Vitals   08/28/18 1239 08/28/18 1300 08/28/18 1330 08/28/18 1532  BP: (!) 112/95 (!) 130/96 128/81   Pulse: (!) 114 (!) 112 (!) 110   Resp: (!) 25 (!) 21 (!) 31   Temp:      TempSrc:      SpO2: 98% 100% 97%   Weight:      Height:      PainSc:    0-No pain    Isolation Precautions No active isolations  Medications Medications  sodium chloride (PF) 0.9 % injection (has no administration in time range)  iopamidol (ISOVUE-370) 76 % injection (has no administration in time range)  perflutren lipid microspheres (DEFINITY) IV suspension (3 mLs Intravenous Given 08/28/18 1525)  ipratropium-albuterol (DUONEB) 0.5-2.5 (3) MG/3ML nebulizer solution 3 mL (3 mLs Nebulization Given 08/28/18 0736)  iopamidol (ISOVUE-370) 76 % injection 100 mL (100 mLs Intravenous Contrast Given 08/28/18 0922)  aspirin chewable tablet 324 mg (324 mg Oral Given 08/28/18 1239)  furosemide (  LASIX) injection 40 mg (40 mg Intravenous Given 08/28/18 1239)    Mobility walks

## 2018-08-28 NOTE — ED Notes (Signed)
Pt returned from CT °

## 2018-08-28 NOTE — ED Notes (Signed)
RN has been notified about critical lab value as well as MD.

## 2018-08-28 NOTE — Progress Notes (Signed)
Nurse notified me that patient was very anxious about update and plan for medications ordered (Lasix, heparin). I called in and spoke with her in her room to relay her current findings and plan of care. She is understandably anxious and overwhelmed, tried to walk her through what we have found, what we plan to do further and which medications are being started this evening. I asked her to focus on one day at a time and she was able to calm down. Will also provide PRN rx for Xanax to help her rest. She was appreciative of update. Dayna Dunn PA-C

## 2018-08-28 NOTE — ED Triage Notes (Signed)
Pt reports not having improvement after being diagnosed with pneumonia a week ago. Pt reports going through 2 inhalers.

## 2018-08-28 NOTE — ED Notes (Signed)
ECHO at bedside.

## 2018-08-28 NOTE — ED Notes (Signed)
Pt anxious about dx. This RN spent time at length discussing with pt about plan of care. Pt informed to direct all dx questions to admitting provider. Pt verbalizing understanding. Pt calmer when this RN left the room. Pts call bell within reach. Pt instructed to call for anything. Pts vitals WDL/baseline. Will continue to monitor pt.

## 2018-08-28 NOTE — ED Notes (Signed)
Pt ambulate around the department and back to the room 100% on return 92%

## 2018-08-28 NOTE — Progress Notes (Signed)
ANTICOAGULATION CONSULT NOTE  Pharmacy Consult for IV heparin Indication: LV thrombus  Allergies  Allergen Reactions  . Other Shortness Of Breath    Environmental allergies  . Gluten Meal     Gluten makes her wheeze    Patient Measurements: Height: 5\' 1"  (154.9 cm) Weight: 199 lb 15.3 oz (90.7 kg) IBW/kg (Calculated) : 47.8 Heparin Dosing Weight: 70 kg  Vital Signs: Temp: 98.6 F (37 C) (02/24 1127) Temp Source: Oral (02/24 1127) BP: 132/89 (02/24 1706) Pulse Rate: 89 (02/24 1706)  Labs: Recent Labs    08/28/18 0727  HGB 11.7*  HCT 35.7*  PLT 442*  CREATININE 0.76    Estimated Creatinine Clearance: 106.5 mL/min (by C-G formula based on SCr of 0.76 mg/dL).   Medical History: Past Medical History:  Diagnosis Date  . Abnormal vaginal Pap smear   . Anxiety   . Asthma    HX - rarely uses inhaler - seasonal  . Bipolar 1 disorder (HCC)   . Depression   . History of depression   . History of trichomoniasis   . Hypertension   . Pneumonia    CHILDHOOD    Medications:  (Not in a hospital admission)  Scheduled:  . heparin  5,000 Units Intravenous Once  . iopamidol      . sodium chloride (PF)       Assessment: 60 yoF with Hx anxiety, asthma, bipolar, depression, obesity, presenting with progressive SOB x 1 month. D-dimer elevated but CTA negative for PE. 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 Pharmacy to dose heparin IV.   Baseline INR, aPTT: pending  Prior anticoagulation: none  Significant events:  Today, 08/28/2018:  CBC: Hgb slightly low, Plt slightly elevated  No bleeding or infusion issues per nursing  Goal of Therapy: Heparin level 0.3-0.7 units/ml Monitor platelets by anticoagulation protocol: Yes  Plan:  Heparin 500 units IV bolus x 1  Heparin 1200 units/hr IV infusion  Check heparin level 6 hrs after start  Daily CBC, daily heparin level once stable  Monitor for signs of bleeding or thrombosis   Bernadene Person,  PharmD, BCPS [phone] 08/28/2018, 5:18 PM

## 2018-08-28 NOTE — Progress Notes (Signed)
  Echocardiogram 2D Echocardiogram has been performed.  Sheila Estes 08/28/2018, 3:57 PM

## 2018-08-28 NOTE — Progress Notes (Signed)
CRITICAL VALUE ALERT  Critical Value:  Troponin 0.33  Date & Time Notied:  08/28/18 2006  Provider Notified: Dr Dorita Fray  Orders Received/Actions taken:  No new orders at this time.

## 2018-08-28 NOTE — H&P (Addendum)
Cardiology Admission History and Physical:   Patient ID: Sheila Estes MRN: [id number]; DOB: 1990/08/27   Admission date: 08/28/2018  Primary Care Provider: System, Pcp Not In Primary Cardiologist: New patient Primary Electrophysiologist:  None   Chief Complaint: Shortness of breath  Patient Profile:   Sheila Estes is a 28 y.o. female with history of obesity, anxiety, asthma, bipolar disorder and depression who presented to ER with shortness of breath.  History of Present Illness:   [name] is a 28 y.o. female with history of obesity, anxiety, asthma, bipolar disorder and depression who presented to ER a week ago with shortness of breath and was told that she has pneumonia and prescribed inhalers.  She presents again week later with minimal improvement in her symptoms.  She states that she has lifelong asthma and h/o recurrent pneumonia and hospitalizations as a child. Of note, she is an Automotive engineer, currently working at [employer]. She was able to work despite her SOB. It started a month ago, she noticed palpitations following ingestion of a Red Bull, she states that she had been drinking a lot of energy drinks and caffeine. None since that episode. However, has noticed progressively worsening SOB since then, unable to sleep, wakes up at 2-3 am with SOB and cough and has to sit up in order to catch breath. No LE edema, no presyncope or syncope. No recent URI, no fever/ chills, cough productive of yellowish sputum. Lives with a 36 year old daughter who hasn't been sick recently. No FH of cardiomyopathy or SDC, he father died of drug abuse/withdrawal complicated by MI.  She denies any drug abuse, amphetamine, no smoking or etoh abuse. No recent pregnancy. In her pregnancy 8 years ago she was hypertensive but didn't require meds postpartum, she is followed by her PCP on a regular basis.  Her labs show potassium 3.8, creatinine 1.76, albumin 3.1, normal LFTs, BNP of 639, troponin  0.3, hemoglobin 11.7.  D-dimer elevated at 6.5. Her chest x-ray shows significant progression of diffuse bilateral airspace disease that is symmetric and suggestive of severe pulmonary edema. Because of abnormal d-dimer and chest x-ray she underwent chest CT that showed symmetric diffuse central ground-glass and consolidative peribronchovascular densities in both lungs may reflect cardiogenic pulmonary edema given mild cardiomegaly, but possibly also fat emboli syndrome, toxic inhalation, acute hypersensitivity pneumonitis, or drug reaction. Diffuse alveolar hemorrhage could have a similar appearance. No evidence of pulmonary embolism.  Past Medical History:  Diagnosis Date  . Abnormal vaginal Pap smear   . Anxiety   . Asthma    HX - rarely uses inhaler - seasonal  . Bipolar 1 disorder ([place])   . Depression   . History of depression   . History of trichomoniasis   . Hypertension   . Pneumonia    CHILDHOOD   Past Surgical History:  Procedure Laterality Date  . ARTHROSCOPTIC SHOULDER Left 2007  . CERVICAL CONIZATION W/BX N/A 12/14/2016   Procedure: COLD KNIFE CONIZATION, REMOVAL OF BODY JEWELRY;  Surgeon: Aletha Halim, MD;  Location: [facility];  Service: Gynecology;  Laterality: N/A;    Medications Prior to Admission: Prior to Admission medications   Medication Sig Start Date End Date Taking? Authorizing Provider  albuterol (PROVENTIL HFA;VENTOLIN HFA) 108 (90 Base) MCG/ACT inhaler Inhale 2 puffs into the lungs every 6 (six) hours as needed for wheezing or shortness of breath.   Yes [provider]  amoxicillin-clavulanate (AUGMENTIN) 875-125 MG tablet Take 1 tablet by mouth every 12 (twelve) hours.  08/20/18  Yes Montine Circle, PA-C  benzonatate (TESSALON) 100 MG capsule Take 2 capsules (200 mg total) by mouth 2 (two) times daily as needed for cough. 08/20/18  Yes Montine Circle, PA-C  buPROPion (WELLBUTRIN) 100 MG tablet Take 100 mg by mouth 2 (two) times daily.    Yes  [provider]  citalopram (CELEXA) 20 MG tablet Take 20 mg by mouth daily.   Yes [provider]  ibuprofen (ADVIL,MOTRIN) 200 MG tablet Take 200 mg by mouth daily as needed for headache.    Yes [provider]  loratadine (CLARITIN) 10 MG tablet Take 10 mg by mouth daily as needed for allergies.   Yes [provider]    Allergies:    Allergies  Allergen Reactions  . Other Shortness Of Breath    Environmental allergies  . Gluten Meal     Gluten makes her wheeze   Social History:   Social History   Socioeconomic History  . Marital status: Single    Spouse name: Not on file  . Number of children: Not on file  . Years of education: Not on file  . Highest education level: Not on file  Occupational History  . Not on file  Social Needs  . Financial resource strain: Not on file  . Food insecurity:    Worry: Not on file    Inability: Not on file  . Transportation needs:    Medical: Not on file    Non-medical: Not on file  Tobacco Use  . Smoking status: Never Smoker  . Smokeless tobacco: Never Used  Substance and Sexual Activity  . Alcohol use: Yes    Comment: socially wine  . Drug use: No  . Sexual activity: Yes    Birth control/protection: Condom  Lifestyle  . Physical activity:    Days per week: Not on file    Minutes per session: Not on file  . Stress: Not on file  Relationships  . Social connections:    Talks on phone: Not on file    Gets together: Not on file    Attends religious service: Not on file    Active member of club or organization: Not on file    Attends meetings of clubs or organizations: Not on file    Relationship status: Not on file  . Intimate partner violence:    Fear of current or ex partner: Not on file    Emotionally abused: Not on file    Physically abused: Not on file    Forced sexual activity: Not on file  Other Topics Concern  . Not on file  Social History Narrative  . Not on file    Family  History:  The patient's family history includes Diabetes in her maternal grandmother; Hypertension in her father, maternal grandfather, and mother.  Father died of MI in his 73' as a result of drug withdrawal, mother died of complications of poorly controlled hypertension.   ROS:  Please see the history of present illness.  All other ROS reviewed and negative.     Physical Exam/Data:   Vitals:   08/28/18 1230 08/28/18 1239 08/28/18 1300 08/28/18 1330  BP: (!) 112/95 (!) 112/95 (!) 130/96 128/81  Pulse: (!) 114 (!) 114 (!) 112 (!) 110  Resp: (!) 32 (!) 25 (!) 21 (!) 31  Temp:      TempSrc:      SpO2: 99% 98% 100% 97%  Weight:      Height:  No intake or output data in the 24 hours ending 08/28/18 1410 Last 3 Weights 08/28/2018 08/20/2018 08/19/2018  Weight (lbs) 199 lb 15.3 oz 200 lb 200 lb  Weight (kg) 90.7 kg 90.719 kg 90.719 kg     Body mass index is 37.78 kg/m.  General:  Well nourished, well developed, in no acute distress HEENT: normal Lymph: no adenopathy Neck: JVD difficult to assess, thick neck Endocrine:  No thryomegaly Vascular: No carotid bruits; FA pulses 2+ bilaterally without bruits  Cardiac:  normal S1, S2; RRR; no murmur  Lungs:  scattered rales, not at the bases, but rather central  Abd: soft, nontender, no hepatomegaly  Ext: no edema Musculoskeletal:  No deformities, BUE and BLE strength normal and equal Skin: warm and dry  Neuro:  CNs 2-12 intact, no focal abnormalities noted Psych:  Normal affect   EKG:  The ECG that was done was personally reviewed and demonstrates sinus tachycardia, with left axis deviation, poor R wave progression in the anterior leads and possible RVH.  Relevant CV Studies: Echo is pending  Laboratory Data:  Chemistry Recent Labs  Lab 08/28/18 0727  NA 139  K 3.8  CL 106  CO2 25  GLUCOSE 101*  BUN 8  CREATININE 0.76  CALCIUM 8.8*  GFRNONAA >60  GFRAA >60  ANIONGAP 8    Recent Labs  Lab 08/28/18 1031  PROT  7.2  ALBUMIN 3.1*  AST 15  ALT 16  ALKPHOS 33*  BILITOT 0.7   Hematology Recent Labs  Lab 08/28/18 0727  WBC 7.9  RBC 3.94  HGB 11.7*  HCT 35.7*  MCV 90.6  MCH 29.7  MCHC 32.8  RDW 12.9  PLT 442*   Cardiac EnzymesNo results for input(s): TROPONINI in the last 168 hours.  Recent Labs  Lab 08/28/18 1041  TROPIPOC 0.30*    BNP Recent Labs  Lab 08/28/18 1031  BNP 639.6*    DDimer  Recent Labs  Lab 08/28/18 0727  DDIMER 6.52*   Radiology/Studies:  Dg Chest 2 View  Result Date: 08/28/2018 CLINICAL DATA:  Short of breath EXAM: CHEST - 2 VIEW COMPARISON:  08/20/2018 FINDINGS: Significant progression of diffuse bilateral airspace disease which is symmetric and spares the periphery. Mild cardiac enlargement. No pleural effusion. No collapse. No acute skeletal abnormality. IMPRESSION: Significant progression of symmetric bilateral airspace disease. Cardiac enlargement. This may represent cardiogenic or noncardiogenic edema. Infection also possible. Electronically Signed   By: Franchot Gallo M.D.   On: 08/28/2018 08:03   Ct Angio Chest Pe W/cm &/or Wo Cm  Result Date: 08/28/2018 CLINICAL DATA:  Severe shortness of breath. EXAM: CT ANGIOGRAPHY CHEST WITH CONTRAST TECHNIQUE: Multidetector CT imaging of the chest was performed using the standard protocol during bolus administration of intravenous contrast. Multiplanar CT image reconstructions and MIPs were obtained to evaluate the vascular anatomy. CONTRAST:  132m ISOVUE-370 IOPAMIDOL (ISOVUE-370) INJECTION 76% COMPARISON:  Chest x-ray from same day. FINDINGS: Cardiovascular: Satisfactory opacification of the pulmonary arteries to the segmental level. No evidence of pulmonary embolism. Mild cardiomegaly. Trace pericardial effusion. No thoracic aortic aneurysm or dissection. Mediastinum/Nodes: Prominent subcentimeter bilateral hilar lymph nodes are likely reactive. No enlarged mediastinal or axillary lymph nodes. Thyroid gland,  trachea, and esophagus demonstrate no significant findings. Lungs/Pleura: Relatively symmetric, diffuse, central ground-glass and consolidative peribronchovascular densities in both lungs. Scattered mild interlobular septal thickening. Diffuse moderate to severe peribronchial thickening. No pneumothorax. Upper Abdomen: No acute abnormality. Musculoskeletal: No chest wall abnormality. No acute or significant osseous findings. Review  of the MIP images confirms the above findings. IMPRESSION: 1. Relatively symmetric diffuse central ground-glass and consolidative peribronchovascular densities in both lungs may reflect cardiogenic pulmonary edema given mild cardiomegaly, interlobular septal thickening, and small bilateral pleural effusions. However, the differential also includes non-cardiogenic capillary leak pulmonary edema such as from prolonged hypotension, fat emboli syndrome, toxic inhalation, acute hypersensitivity pneumonitis, or drug reaction. Diffuse alveolar hemorrhage could have a similar appearance. 2.  No evidence of pulmonary embolism. Electronically Signed   By: Titus Dubin M.D.   On: 08/28/2018 09:57    Assessment and Plan:   1. Acute cimbined systolic and diastolic CHF - with abnormal CXR and chest CT, elevated BNP, D dimer - I have reviewed her echocardiogram, LVEF is severely decreased estimated at 15-20% with apical akinesis and relatively preserved function of the basal segments (? Takotsubo cardiomyopathy), there is a large apical thrombus, grade 2 DD with severely elevated LVEDP, mildly dilated left atrium, RV systolic function is at least mildly decreased. - we will continue lasix 40 mg iv Q6H - start iv Heparin for an apical thrombus - no BB yet, I will start low dose Entresto 26/24 mg PO BID - obtain CRP, ESR, ANA, RF - we will ask CHF team for evaluation and possible left and right cath - order cardiac MRI - transfer to [facility] for further management  2. Abnormal CTA -  symmetric diffuse central ground-glass and consolidative peribronchovascular densities in both lungs may reflect cardiogenic pulmonary edema given mild cardiomegaly, but possibly also fat emboli syndrome, toxic inhalation, acute hypersensitivity pneumonitis, or drug reaction. Diffuse alveolar hemorrhage could have a similar appearance. No evidence of pulmonary embolism. - this could possibly contribute to CHF, however based on her echo ot seems to be rather long term process  Severity of Illness: The appropriate patient status for this patient is INPATIENT. Inpatient status is judged to be reasonable and necessary in order to provide the required intensity of service to ensure the patient's safety. The patient's presenting symptoms, physical exam findings, and initial radiographic and laboratory data in the context of their chronic comorbidities is felt to place them at high risk for further clinical deterioration. Furthermore, it is not anticipated that the patient will be medically stable for discharge from the hospital within 2 midnights of admission. The following factors support the patient status of inpatient.   " The patient's presenting symptoms include acute CHF. " The worrisome physical exam findings include SOB, tachycardia. " The initial radiographic and laboratory data are worrisome because of abnormal CT chest as above. " The chronic co-morbidities include obesity, asthma.  I certify that at the point of admission it is my clinical judgment that the patient will require inpatient hospital care spanning beyond 2 midnights from the point of admission due to high intensity of service, high risk for further deterioration and high frequency of surveillance required.*   For questions or updates, please contact [place] Please consult [email or web address] for contact info under   Signed, Ena Dawley, MD  08/28/2018 2:10 PM

## 2018-08-29 ENCOUNTER — Other Ambulatory Visit (HOSPITAL_COMMUNITY): Payer: Medicaid Other

## 2018-08-29 ENCOUNTER — Inpatient Hospital Stay (HOSPITAL_COMMUNITY): Payer: Self-pay

## 2018-08-29 DIAGNOSIS — I5021 Acute systolic (congestive) heart failure: Secondary | ICD-10-CM

## 2018-08-29 DIAGNOSIS — I429 Cardiomyopathy, unspecified: Secondary | ICD-10-CM

## 2018-08-29 DIAGNOSIS — I513 Intracardiac thrombosis, not elsewhere classified: Secondary | ICD-10-CM

## 2018-08-29 LAB — BASIC METABOLIC PANEL
Anion gap: 12 (ref 5–15)
BUN: 6 mg/dL (ref 6–20)
CO2: 22 mmol/L (ref 22–32)
Calcium: 9 mg/dL (ref 8.9–10.3)
Chloride: 106 mmol/L (ref 98–111)
Creatinine, Ser: 0.83 mg/dL (ref 0.44–1.00)
GFR calc Af Amer: >60 mL/min (ref >60)
GFR calc non Af Amer: >60 mL/min (ref >60)
Glucose, Bld: 95 mg/dL (ref 70–99)
Potassium: 3.5 mmol/L (ref 3.5–5.1)
Sodium: 140 mmol/L (ref 135–145)

## 2018-08-29 LAB — HEPARIN LEVEL (UNFRACTIONATED)
Heparin Unfractionated: 0.21 IU/mL — ABNORMAL LOW (ref 0.30–0.70)
Heparin Unfractionated: 0.48 IU/mL (ref 0.30–0.70)
Heparin Unfractionated: 0.37 IU/mL (ref 0.30–0.70)

## 2018-08-29 LAB — HIV ANTIBODY (ROUTINE TESTING W REFLEX): HIV Screen 4th Generation wRfx: NONREACTIVE

## 2018-08-29 LAB — TSH: TSH: 2.144 u[IU]/mL (ref 0.350–4.500)

## 2018-08-29 LAB — TROPONIN I: Troponin I: 0.28 ng/mL (ref <0.03)

## 2018-08-29 LAB — PROTIME-INR
INR: 1.1 (ref 0.8–1.2)
Prothrombin Time: 14.4 seconds (ref 11.4–15.2)

## 2018-08-29 MED ORDER — LOSARTAN POTASSIUM 25 MG PO TABS
25.0000 mg | ORAL_TABLET | Freq: Every day | ORAL | Status: DC
  Administered 2018-08-29 – 2018-08-31 (×3): 25 mg via ORAL
  Filled 2018-08-29 (×3): qty 1

## 2018-08-29 MED ORDER — FUROSEMIDE 10 MG/ML IJ SOLN
60.0000 mg | Freq: Two times a day (BID) | INTRAMUSCULAR | Status: DC
  Administered 2018-08-29: 60 mg via INTRAVENOUS
  Filled 2018-08-29: qty 6

## 2018-08-29 MED ORDER — ATORVASTATIN CALCIUM 80 MG PO TABS
80.0000 mg | ORAL_TABLET | Freq: Every day | ORAL | Status: DC
  Administered 2018-08-29 – 2018-09-03 (×6): 80 mg via ORAL
  Filled 2018-08-29 (×6): qty 1

## 2018-08-29 MED ORDER — ASPIRIN 81 MG PO CHEW
81.0000 mg | CHEWABLE_TABLET | ORAL | Status: AC
  Administered 2018-08-30: 81 mg via ORAL
  Filled 2018-08-29: qty 1

## 2018-08-29 MED ORDER — HEPARIN BOLUS VIA INFUSION
2000.0000 [IU] | Freq: Once | INTRAVENOUS | Status: AC
  Administered 2018-08-29: 2000 [IU] via INTRAVENOUS
  Filled 2018-08-29: qty 2000

## 2018-08-29 MED ORDER — GADOBUTROL 1 MMOL/ML IV SOLN
10.0000 mL | Freq: Once | INTRAVENOUS | Status: AC | PRN
  Administered 2018-08-29: 10 mL via INTRAVENOUS

## 2018-08-29 MED ORDER — SODIUM CHLORIDE 0.9% FLUSH: 3.0000 mL | INTRAVENOUS | Status: DC | PRN

## 2018-08-29 MED ORDER — SODIUM CHLORIDE 0.9 % IV SOLN: 250.0000 mL | INTRAVENOUS | Status: DC | PRN

## 2018-08-29 MED ORDER — ENSURE ENLIVE PO LIQD: 237.0000 mL | Freq: Two times a day (BID) | ORAL | Status: DC

## 2018-08-29 MED ORDER — SODIUM CHLORIDE 0.9 % IV SOLN
INTRAVENOUS | Status: DC
  Administered 2018-08-30: 06:00:00 via INTRAVENOUS

## 2018-08-29 MED ORDER — SPIRONOLACTONE 12.5 MG HALF TABLET
12.5000 mg | ORAL_TABLET | Freq: Every day | ORAL | Status: DC
  Administered 2018-08-29: 12.5 mg via ORAL
  Filled 2018-08-29 (×2): qty 1

## 2018-08-29 MED ORDER — SODIUM CHLORIDE 0.9% FLUSH: 3.0000 mL | Freq: Two times a day (BID) | INTRAVENOUS | Status: DC

## 2018-08-29 MED ORDER — POTASSIUM CHLORIDE CRYS ER 20 MEQ PO TBCR
40.0000 meq | EXTENDED_RELEASE_TABLET | Freq: Once | ORAL | Status: AC
  Administered 2018-08-29: 40 meq via ORAL
  Filled 2018-08-29: qty 2

## 2018-08-29 NOTE — Plan of Care (Signed)
  Problem: Health Behavior/Discharge Planning: Goal: Ability to manage health-related needs will improve Outcome: Progressing   Problem: Clinical Measurements: Goal: Ability to maintain clinical measurements within normal limits will improve Outcome: Progressing   Problem: Activity: Goal: Risk for activity intolerance will decrease Outcome: Progressing   Problem: Coping: Goal: Level of anxiety will decrease Outcome: Progressing   Problem: Safety: Goal: Ability to remain free from injury will improve Outcome: Progressing   

## 2018-08-29 NOTE — Care Management Note (Addendum)
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  Expected Discharge Date:  Possibly 09/01/2018             Expected Discharge Plan:  Home/Self Care  In-House Referral:  Financial Counselor  Discharge planning Services  CM Consult  Status of Service:  In process, will continue to follow  Reola Mosher [phone] 08/29/2018, 1:00 PM

## 2018-08-29 NOTE — Progress Notes (Signed)
Spoke with RN and asked if the IV consult could be placed in tomorrow prior to the Cath, RN stated she would let them know on night shift

## 2018-08-29 NOTE — Progress Notes (Addendum)
ANTICOAGULATION CONSULT NOTE  Pharmacy Consult for IV heparin Indication: LV thrombus  Allergies  Allergen Reactions  . Other Shortness Of Breath    Environmental allergies  . Gluten Meal     Gluten makes her wheeze    Patient Measurements: Height: 5\' 1"  (154.9 cm) Weight: 196 lb 14.4 oz (89.3 kg) IBW/kg (Calculated) : 47.8 Heparin Dosing Weight: 70 kg  Vital Signs: Temp: 98.4 F (36.9 C) (02/25 0800) Temp Source: Oral (02/25 0800) BP: 113/68 (02/25 0800) Pulse Rate: 101 (02/25 0800)  Labs: Recent Labs    08/28/18 0727 08/28/18 1837 08/29/18 0009 08/29/18 0723  HGB 11.7*  --   --   --   HCT 35.7*  --   --   --   PLT 442*  --   --   --   LABPROT  --   --  14.4  --   INR  --   --  1.1  --   HEPARINUNFRC  --   --  0.21* 0.48  CREATININE 0.76  --  0.83  --   TROPONINI  --  0.33* 0.28*  --     Estimated Creatinine Clearance: 101.7 mL/min (by C-G formula based on SCr of 0.83 mg/dL).   Medical History: Past Medical History:  Diagnosis Date  . Abnormal vaginal Pap smear   . Anxiety   . Asthma    HX - rarely uses inhaler - seasonal  . Bipolar 1 disorder ([facility])   . Depression   . History of depression   . History of trichomoniasis   . Hypertension   . Pneumonia    CHILDHOOD    Medications:  Medications Prior to Admission  Medication Sig Dispense Refill Last Dose  . albuterol (PROVENTIL HFA;VENTOLIN HFA) 108 (90 Base) MCG/ACT inhaler Inhale 2 puffs into the lungs every 6 (six) hours as needed for wheezing or shortness of breath.   08/27/2018 at Unknown time  . amoxicillin-clavulanate (AUGMENTIN) 875-125 MG tablet Take 1 tablet by mouth every 12 (twelve) hours. 14 tablet 0 08/27/2018 at Unknown time  . benzonatate (TESSALON) 100 MG capsule Take 2 capsules (200 mg total) by mouth 2 (two) times daily as needed for cough. 20 capsule 0 08/27/2018 at Unknown time  . buPROPion (WELLBUTRIN) 100 MG tablet Take 100 mg by mouth 2 (two) times daily.    08/27/2018 at Unknown  time  . citalopram (CELEXA) 20 MG tablet Take 20 mg by mouth daily.   08/27/2018 at Unknown time  . ibuprofen (ADVIL,MOTRIN) 200 MG tablet Take 200 mg by mouth daily as needed for headache.    08/27/2018 at Unknown time  . loratadine (CLARITIN) 10 MG tablet Take 10 mg by mouth daily as needed for allergies.   08/27/2018 at Unknown time   Scheduled:  . aspirin EC  81 mg Oral Daily  . buPROPion  100 mg Oral BID  . citalopram  20 mg Oral Daily  . furosemide  40 mg Intravenous Q6H  . sodium chloride flush  3 mL Intravenous Q12H   Assessment: 73 yoF with Hx anxiety, asthma, bipolar, depression, obesity, presenting with progressive SOB x 1 month. D-dimer elevated but CTA negative for PE. 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 Pharmacy to dose heparin IV.  Received a bolus of 5000 units on 2/24 at 1730 then 2000 units on 2/25 at 0115. Initial heparin level came back therapeutic at 0.48, on 1500 units/hr. Hgb 11.7, plt 442. No s/sx of bleeding. No infusion issues.  Goal of Therapy: Heparin level 0.3-0.7 units/ml Monitor platelets by anticoagulation protocol: Yes  Plan: Continue heparin infusion at 1500 units/hr Check heparin level 6 hrs  Daily CBC, daily heparin level once stable Monitor for signs of bleeding or thrombosis  Sherron Monday, PharmD, BCCCP Clinical Pharmacist  Pager: [phone] Phone: [phone] 08/29/2018, 8:28 AM  ADDENDUM Heparin level this afternoon came back therapeutic at 0.37, on 1500 units/hr. No s/sx of bleeding or infusion issues. Continue at same rate and monitor heparin level daily with morning labs.  Sherron Monday, PharmD, BCCCP Clinical Pharmacist

## 2018-08-29 NOTE — H&P (View-Only) (Signed)
  Advanced Heart Failure Team Consult Note   Primary Physician: System, Pcp Not In PCP-Cardiologist:  No primary care provider on file.  Reason for Consultation: CHF  HPI:    Sheila Estes is seen today for evaluation of CHF at the request of Dr. Nelson.   She has no cardiac history, has had history of asthma and depression.  She is a former teacher, now works at [employer].  She has been under a lot of stress recently with a new job, conflicts with ex-husband over her daughter, and taking graduate classes.  She was in her usual state of health until about a month ago.  She was drinking a lot of caffeine (Red Bull) at baseline.  One day a month ago, she drank 2 Red Bulls and afterwards felt horrible.  She had pain across her chest and her heart raced for about 2 hours then went back to normal.  She says that she has not felt good since then.  She has not felt anymore tachypalpitations, but she has been short of breath with exertion (cannot get a deep breath when exerting herself) and she also developed orthopnea/PND.  About 2 wks ago, she went to an urgent care and they told her she had an asthma flare.  A week or so ago, she went to the [facility] and was told she had PNA and given abx.  She felt a little better with antibiotics but then dyspnea worsened so she came back to [facility] ER on 2/24.  At that time, she was told that CXR showed pulmonary edema.  D dimer was elevated, CTA chest showed no PE but showed pulmonary edema.  BNP was elevated and troponin was mildly elevated at 0.33 maximal.   Echo was done, this showed EF 25-30% with peri-apical akinesis and apical thrombus.  The RV looked relatively normal.  Her ECG was also abnormal, sinus but with inferior Qs and anterior Qs.  Her mother had a PE in her 50s but no FH of cardiomyopathy or early CAD.  She has not had any caffeine x 1 month.  No smoking, rare ETOH, no drugs.  She has one child who is 8, no problems post-partum.   She was started on  IV Lasix and diuresed well overnight, breathing is improved.  She was started on heparin gtt for LV thrombus.  She is very anxious about her diagnosis.    Review of Systems:  All systems reviewed and negative except as per HPI.   Home Medications Prior to Admission medications   Medication Sig Start Date End Date Taking? Authorizing Provider  albuterol (PROVENTIL HFA;VENTOLIN HFA) 108 (90 Base) MCG/ACT inhaler Inhale 2 puffs into the lungs every 6 (six) hours as needed for wheezing or shortness of breath.   Yes [provider]  amoxicillin-clavulanate (AUGMENTIN) 875-125 MG tablet Take 1 tablet by mouth every 12 (twelve) hours. 08/20/18  Yes Browning, Robert, PA-C  benzonatate (TESSALON) 100 MG capsule Take 2 capsules (200 mg total) by mouth 2 (two) times daily as needed for cough. 08/20/18  Yes Browning, Robert, PA-C  buPROPion (WELLBUTRIN) 100 MG tablet Take 100 mg by mouth 2 (two) times daily.    Yes [provider]  citalopram (CELEXA) 20 MG tablet Take 20 mg by mouth daily.   Yes [provider]  ibuprofen (ADVIL,MOTRIN) 200 MG tablet Take 200 mg by mouth daily as needed for headache.    Yes [provider]  loratadine (CLARITIN) 10 MG   tablet Take 10 mg by mouth daily as needed for allergies.   Yes [provider]    Past Medical History: 1. Depression 2. Anxiety 3. Asthma  Past Surgical History: Past Surgical History:  Procedure Laterality Date  . ARTHROSCOPTIC SHOULDER Left 2007  . CERVICAL CONIZATION W/BX N/A 12/14/2016   Procedure: COLD KNIFE CONIZATION, REMOVAL OF BODY JEWELRY;  Surgeon: Pickens, Charlie, MD;  Location: [facility];  Service: Gynecology;  Laterality: N/A;    Family History: Family History  Problem Relation Age of Onset  . Hypertension Mother   . Hypertension Father   . Diabetes Maternal Grandmother   . Hypertension Maternal Grandfather     Social History: Social History   Socioeconomic History  . Marital status:  Single    Spouse name: Not on file  . Number of children: Not on file  . Years of education: Not on file  . Highest education level: Not on file  Occupational History  . Not on file  Social Needs  . Financial resource strain: Not on file  . Food insecurity:    Worry: Not on file    Inability: Not on file  . Transportation needs:    Medical: Not on file    Non-medical: Not on file  Tobacco Use  . Smoking status: Never Smoker  . Smokeless tobacco: Never Used  Substance and Sexual Activity  . Alcohol use: Yes    Comment: socially wine  . Drug use: No  . Sexual activity: Yes    Birth control/protection: Condom  Lifestyle  . Physical activity:    Days per week: Not on file    Minutes per session: Not on file  . Stress: Not on file  Relationships  . Social connections:    Talks on phone: Not on file    Gets together: Not on file    Attends religious service: Not on file    Active member of club or organization: Not on file    Attends meetings of clubs or organizations: Not on file    Relationship status: Not on file  Other Topics Concern  . Not on file  Social History Narrative  . Not on file    Allergies:  Allergies  Allergen Reactions  . Other Shortness Of Breath    Environmental allergies  . Gluten Meal     Gluten makes her wheeze    Objective:    Vital Signs:   Temp:  [98.4 F (36.9 C)-100.3 F (37.9 C)] 98.4 F (36.9 C) (02/25 0800) Pulse Rate:  [89-116] 101 (02/25 0800) Resp:  [14-32] 16 (02/25 0514) BP: (104-141)/(65-96) 113/68 (02/25 0800) SpO2:  [95 %-100 %] 97 % (02/25 0800) Weight:  [89.3 kg-90.3 kg] 89.3 kg (02/25 0514) Last BM Date: 08/27/18  Weight change: Filed Weights   08/28/18 0637 08/28/18 1740 08/29/18 0514  Weight: 90.7 kg 90.3 kg 89.3 kg    Intake/Output:   Intake/Output Summary (Last 24 hours) at 08/29/2018 0959 Last data filed at 08/29/2018 0533 Gross per 24 hour  Intake 136.64 ml  Output 1600 ml  Net -1463.36 ml       Physical Exam    General:  Well appearing. No resp difficulty HEENT: normal Neck: supple. JVP 9-10 cm. Carotids 2+ bilat; no bruits. No lymphadenopathy or thyromegaly appreciated. Cor: PMI nondisplaced. Regular rate & rhythm. No rubs, gallops or murmurs. Lungs: Slight crackles at bases.  Abdomen: soft, nontender, nondistended. No hepatosplenomegaly. No bruits or masses. Good bowel sounds. Extremities: no cyanosis,   clubbing, rash, edema Neuro: alert & orientedx3, cranial nerves grossly intact. moves all 4 extremities w/o difficulty. Affect pleasant   Telemetry   NSR in 90s, personally reviewed.   EKG    NSR with inferior and anterior Qs (personally reviewed).   Labs   Basic Metabolic Panel: Recent Labs  Lab 08/28/18 0727 08/29/18 0009  NA 139 140  K 3.8 3.5  CL 106 106  CO2 25 22  GLUCOSE 101* 95  BUN 8 6  CREATININE 0.76 0.83  CALCIUM 8.8* 9.0    Liver Function Tests: Recent Labs  Lab 08/28/18 1031  AST 15  ALT 16  ALKPHOS 33*  BILITOT 0.7  PROT 7.2  ALBUMIN 3.1*   No results for input(s): LIPASE, AMYLASE in the last 168 hours. No results for input(s): AMMONIA in the last 168 hours.  CBC: Recent Labs  Lab 08/28/18 0727  WBC 7.9  NEUTROABS 4.8  HGB 11.7*  HCT 35.7*  MCV 90.6  PLT 442*    Cardiac Enzymes: Recent Labs  Lab 08/28/18 1837 08/29/18 0009  TROPONINI 0.33* 0.28*    BNP: BNP (last 3 results) Recent Labs    08/28/18 1031  BNP 639.6*    ProBNP (last 3 results) No results for input(s): PROBNP in the last 8760 hours.   CBG: No results for input(s): GLUCAP in the last 168 hours.  Coagulation Studies: Recent Labs    08/29/18 0009  LABPROT 14.4  INR 1.1     Imaging    No results found.   Medications:     Current Medications: . aspirin EC  81 mg Oral Daily  . buPROPion  100 mg Oral BID  . citalopram  20 mg Oral Daily  . furosemide  40 mg Intravenous Q6H  . sodium chloride flush  3 mL Intravenous Q12H      Infusions: . sodium chloride    . heparin 1,500 Units/hr (08/29/18 0719)       Assessment/Plan   1. Acute systolic CHF: Patient presented with new-onset cardiomyopathy, echo with EF 25-30% with peri-apical akinesis and LV thrombus.  Based on her symptoms and the presence of a thrombus, I suspect this process has been present for at least a month.  Cause is uncertain.  Cannot rule out viral myocarditis. However, given the onset with Red Bulls followed by tachypalpitations and chest pain (and a lot of stress in her life), I question whether this could be a Takotsubo (stress) cardiomyopathy (though after a month, would expect some improvement).  Her ECG is abnormal, and though she does not seem to have RFs for CAD and no FH of early CAD, an MI a month ago is not out of the question (could be SCAD event).  Doubt peri-partum CMP (no recent pregnancy), no drug or ETOH use.  Doubt familial.  HIV negative.  At this time, she appears mildly volume overloaded and feels better.  - Send TSH and ANA.  - Plan right/left heart cath tomorrow to assess for coronary disease and assess filling pressures.   - Cardiac MRI ordered, look for infiltrative disease/myocarditis/prior MI.  - Continue Lasix 60 mg IV bid today, probably to po tomorrow.  - Start spironolactone 12.5 mg daily and losartan 25 mg daily, can transition to Entresto if BP tolerates.  Eventually will need beta blocker when well-compensated and Bidil down the road.  2. LV thrombus: She is on IV heparin now, will bridge to warfarin after cath tomorrow.   3. Elevated troponin: TnI peaked   at 0.33.  Doubt ACS.  Suspect demand ischemia from volume overload.  However, with abnormal ECG and peri-apical akinesis on echo, need cath to rule out LAD occlusion (from thrombotic MI versus SCAD).  - Cath planned for tomorrow.  - On heparin gtt.   Length of Stay: 1  Japleen Tornow, MD  08/29/2018, 9:59 AM  Advanced Heart Failure Team Pager [phone] (M-F;  7a - 4p)  Please contact [facility] for night-coverage after hours (4p -7a ) and weekends on [email or web address]  

## 2018-08-29 NOTE — Progress Notes (Signed)
ANTICOAGULATION CONSULT NOTE - Follow Up Consult  Pharmacy Consult for heparin Indication: LV thrombus  Labs: Recent Labs    08/28/18 0727 08/28/18 1837 08/29/18 0009  HGB 11.7*  --   --   HCT 35.7*  --   --   PLT 442*  --   --   LABPROT  --   --  14.4  INR  --   --  1.1  HEPARINUNFRC  --   --  0.21*  CREATININE 0.76  --  0.83  TROPONINI  --  0.33*  --     Assessment: 28yo female subtherapeutic on heparin with initial dosing for LV thrombus; of note bolus and gtt started late so lab was drawn just ~4hr after large bolus, would expect level to be higher if this rate were sufficient at steady state; no gtt issues or signs of bleeding per RN.  Goal of Therapy:  Heparin level 0.3-0.7 units/ml   Plan:  Will give additional bolus of 2000 units and increase heparin gtt by 3-4 units/kgABW/hr to 1500 units/hr and check level in 6 hours.    Vernard Gambles, PharmD, BCPS  08/29/2018,1:15 AM

## 2018-08-29 NOTE — Consult Note (Addendum)
Advanced Heart Failure Team Consult Note   Primary Physician: System, Pcp Not In PCP-Cardiologist:  No primary care provider on file.  Reason for Consultation: CHF  HPI:    Sheila Estes is seen today for evaluation of CHF at the request of Dr. Delton See.   She has no cardiac history, has had history of asthma and depression.  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  She has been under a lot of stress recently with a new job, conflicts with ex-husband over her daughter, and taking graduate classes.  She was in her usual state of health until about a month ago.  She was drinking a lot of caffeine (Red Bull) at baseline.  One day a month ago, she drank 2 Red Bulls and afterwards felt horrible.  She had pain across her chest and her heart raced for about 2 hours then went back to normal.  She says that she has not felt good since then.  She has not felt anymore tachypalpitations, but she has been short of breath with exertion (cannot get a deep breath when exerting herself) and she also developed orthopnea/PND.  About 2 wks ago, she went to an urgent care and they told her she had an asthma flare.  A week or so ago, she went to the [facility] ER and was told she had PNA and given abx.  She felt a little better with antibiotics but then dyspnea worsened so she came back to [facility] ER on 2/24.  At that time, she was told that CXR showed pulmonary edema.  D dimer was elevated, CTA chest showed no PE but showed pulmonary edema.  BNP was elevated and troponin was mildly elevated at 0.33 maximal.   Echo was done, this showed EF 25-30% with peri-apical akinesis and apical thrombus.  The RV looked relatively normal.  Her ECG was also abnormal, sinus but with inferior Qs and anterior Qs.  Her mother had a PE in her 78s but no FH of cardiomyopathy or early CAD.  She has not had any caffeine x 1 month.  No smoking, rare ETOH, no drugs.  She has one child who is 8, no problems post-partum.   She was started on  IV Lasix and diuresed well overnight, breathing is improved.  She was started on heparin gtt for LV thrombus.  She is very anxious about her diagnosis.    Review of Systems:  All systems reviewed and negative except as per HPI.   Home Medications Prior to Admission medications   Medication Sig Start Date End Date Taking? Authorizing Provider  albuterol (PROVENTIL HFA;VENTOLIN HFA) 108 (90 Base) MCG/ACT inhaler Inhale 2 puffs into the lungs every 6 (six) hours as needed for wheezing or shortness of breath.   Yes [provider]  amoxicillin-clavulanate (AUGMENTIN) 875-125 MG tablet Take 1 tablet by mouth every 12 (twelve) hours. 08/20/18  Yes Roxy Horseman, PA-C  benzonatate (TESSALON) 100 MG capsule Take 2 capsules (200 mg total) by mouth 2 (two) times daily as needed for cough. 08/20/18  Yes Roxy Horseman, PA-C  buPROPion (WELLBUTRIN) 100 MG tablet Take 100 mg by mouth 2 (two) times daily.    Yes [provider]  citalopram (CELEXA) 20 MG tablet Take 20 mg by mouth daily.   Yes [provider]  ibuprofen (ADVIL,MOTRIN) 200 MG tablet Take 200 mg by mouth daily as needed for headache.    Yes [provider]  loratadine (CLARITIN) 10 MG  tablet Take 10 mg by mouth daily as needed for allergies.   Yes [provider]    Past Medical History: 1. Depression 2. Anxiety 3. Asthma  Past Surgical History: Past Surgical History:  Procedure Laterality Date  . ARTHROSCOPTIC SHOULDER Left 2007  . CERVICAL CONIZATION W/BX N/A 12/14/2016   Procedure: COLD KNIFE CONIZATION, REMOVAL OF BODY JEWELRY;  Surgeon: Pawhuska Bing, MD;  Location: [facility];  Service: Gynecology;  Laterality: N/A;    Family History: Family History  Problem Relation Age of Onset  . Hypertension Mother   . Hypertension Father   . Diabetes Maternal Grandmother   . Hypertension Maternal Grandfather     Social History: Social History   Socioeconomic History  . Marital status:  Single    Spouse name: Not on file  . Number of children: Not on file  . Years of education: Not on file  . Highest education level: Not on file  Occupational History  . Not on file  Social Needs  . Financial resource strain: Not on file  . Food insecurity:    Worry: Not on file    Inability: Not on file  . Transportation needs:    Medical: Not on file    Non-medical: Not on file  Tobacco Use  . Smoking status: Never Smoker  . Smokeless tobacco: Never Used  Substance and Sexual Activity  . Alcohol use: Yes    Comment: socially wine  . Drug use: No  . Sexual activity: Yes    Birth control/protection: Condom  Lifestyle  . Physical activity:    Days per week: Not on file    Minutes per session: Not on file  . Stress: Not on file  Relationships  . Social connections:    Talks on phone: Not on file    Gets together: Not on file    Attends religious service: Not on file    Active member of club or organization: Not on file    Attends meetings of clubs or organizations: Not on file    Relationship status: Not on file  Other Topics Concern  . Not on file  Social History Narrative  . Not on file    Allergies:  Allergies  Allergen Reactions  . Other Shortness Of Breath    Environmental allergies  . Gluten Meal     Gluten makes her wheeze    Objective:    Vital Signs:   Temp:  [98.4 F (36.9 C)-100.3 F (37.9 C)] 98.4 F (36.9 C) (02/25 0800) Pulse Rate:  [89-116] 101 (02/25 0800) Resp:  [14-32] 16 (02/25 0514) BP: (104-141)/(65-96) 113/68 (02/25 0800) SpO2:  [95 %-100 %] 97 % (02/25 0800) Weight:  [89.3 kg-90.3 kg] 89.3 kg (02/25 0514) Last BM Date: 08/27/18  Weight change: Filed Weights   08/28/18 0637 08/28/18 1740 08/29/18 0514  Weight: 90.7 kg 90.3 kg 89.3 kg    Intake/Output:   Intake/Output Summary (Last 24 hours) at 08/29/2018 0959 Last data filed at 08/29/2018 0533 Gross per 24 hour  Intake 136.64 ml  Output 1600 ml  Net -1463.36 ml       Physical Exam    General:  Well appearing. No resp difficulty HEENT: normal Neck: supple. JVP 9-10 cm. Carotids 2+ bilat; no bruits. No lymphadenopathy or thyromegaly appreciated. Cor: PMI nondisplaced. Regular rate & rhythm. No rubs, gallops or murmurs. Lungs: Slight crackles at bases.  Abdomen: soft, nontender, nondistended. No hepatosplenomegaly. No bruits or masses. Good bowel sounds. Extremities: no cyanosis,  clubbing, rash, edema Neuro: alert & orientedx3, cranial nerves grossly intact. moves all 4 extremities w/o difficulty. Affect pleasant   Telemetry   NSR in 90s, personally reviewed.   EKG    NSR with inferior and anterior Qs (personally reviewed).   Labs   Basic Metabolic Panel: Recent Labs  Lab 08/28/18 0727 08/29/18 0009  NA 139 140  K 3.8 3.5  CL 106 106  CO2 25 22  GLUCOSE 101* 95  BUN 8 6  CREATININE 0.76 0.83  CALCIUM 8.8* 9.0    Liver Function Tests: Recent Labs  Lab 08/28/18 1031  AST 15  ALT 16  ALKPHOS 33*  BILITOT 0.7  PROT 7.2  ALBUMIN 3.1*   No results for input(s): LIPASE, AMYLASE in the last 168 hours. No results for input(s): AMMONIA in the last 168 hours.  CBC: Recent Labs  Lab 08/28/18 0727  WBC 7.9  NEUTROABS 4.8  HGB 11.7*  HCT 35.7*  MCV 90.6  PLT 442*    Cardiac Enzymes: Recent Labs  Lab 08/28/18 1837 08/29/18 0009  TROPONINI 0.33* 0.28*    BNP: BNP (last 3 results) Recent Labs    08/28/18 1031  BNP 639.6*    ProBNP (last 3 results) No results for input(s): PROBNP in the last 8760 hours.   CBG: No results for input(s): GLUCAP in the last 168 hours.  Coagulation Studies: Recent Labs    08/29/18 0009  LABPROT 14.4  INR 1.1     Imaging    No results found.   Medications:     Current Medications: . aspirin EC  81 mg Oral Daily  . buPROPion  100 mg Oral BID  . citalopram  20 mg Oral Daily  . furosemide  40 mg Intravenous Q6H  . sodium chloride flush  3 mL Intravenous Q12H      Infusions: . sodium chloride    . heparin 1,500 Units/hr (08/29/18 0719)       Assessment/Plan   1. Acute systolic CHF: Patient presented with new-onset cardiomyopathy, echo with EF 25-30% with peri-apical akinesis and LV thrombus.  Based on her symptoms and the presence of a thrombus, I suspect this process has been present for at least a month.  Cause is uncertain.  Cannot rule out viral myocarditis. However, given the onset with Red Bulls followed by tachypalpitations and chest pain (and a lot of stress in her life), I question whether this could be a Takotsubo (stress) cardiomyopathy (though after a month, would expect some improvement).  Her ECG is abnormal, and though she does not seem to have RFs for CAD and no FH of early CAD, an MI a month ago is not out of the question (could be SCAD event).  Doubt peri-partum CMP (no recent pregnancy), no drug or ETOH use.  Doubt familial.  HIV negative.  At this time, she appears mildly volume overloaded and feels better.  - Send TSH and ANA.  - Plan right/left heart cath tomorrow to assess for coronary disease and assess filling pressures.   - Cardiac MRI ordered, look for infiltrative disease/myocarditis/prior MI.  - Continue Lasix 60 mg IV bid today, probably to po tomorrow.  - Start spironolactone 12.5 mg daily and losartan 25 mg daily, can transition to Entresto if BP tolerates.  Eventually will need beta blocker when well-compensated and Bidil down the road.  2. LV thrombus: She is on IV heparin now, will bridge to warfarin after cath tomorrow.   3. Elevated troponin: TnI peaked  at 0.33.  Doubt ACS.  Suspect demand ischemia from volume overload.  However, with abnormal ECG and peri-apical akinesis on echo, need cath to rule out LAD occlusion (from thrombotic MI versus SCAD).  - Cath planned for tomorrow.  - On heparin gtt.   Length of Stay: 1  Marca Ancona, MD  08/29/2018, 9:59 AM  Advanced Heart Failure Team Pager [phone] (M-F;  7a - 4p)  Please contact [facility] for night-coverage after hours (4p -7a ) and weekends on [email or web address]

## 2018-08-29 NOTE — Progress Notes (Signed)
Initial Nutrition Assessment  DOCUMENTATION CODES:   Obesity unspecified  INTERVENTION:  Education, provided education on low sodium diet.   Ensure Enlive po BID, each supplement provides 350 kcal and 20 grams of protein  NUTRITION DIAGNOSIS:   Inadequate oral intake related to decreased appetite as evidenced by meal completion < 50%, per patient/family report.   GOAL:   Patient will meet greater than or equal to 90% of their needs   MONITOR:   PO intake, Weight trends, Labs, I & O's  REASON FOR ASSESSMENT:   Malnutrition Screening Tool(MST 3)    ASSESSMENT:   Pt is a 28y F with PMH of obesity, anxiety, asthma, bipolar disorder and depression with no cardiac history prior. Presented with SOB with New diagnosis of Acute Systolic CHF.   Pt states that her appetite has decrease within the last month. States that she is eating here in the hospital but not really wanting it. When pt was eating, a typical diet was convenience, fast food, or grab and go items. Pt states that she "has a fast paced, stressed life and she needs quick items." Breakfast is typical of fast food Wendy's, burger Brooke Dare, etc. Anything Burgers and [place]. Lunch is similar and typical, [place] food, Moe's, chipotle. Dinner is usually some sort of pasta, chicken alfredo, or tacos. Snack Items include cheese popcorn, M&M's, or McDonalds Frappes.   Pt endorses weight loss over the past month, with is in line with the decreased appetite. Pt states she loss about 10#, stating her UBW is 205-210#, however this is not consistent with documented weight encounters. Suspected weight loss during admission is due to diuresis.   Pt at baseline is very mobile and active between work and home life, has an 6 year old daughter.    Pt is scheduled for a right/left heart cath tomorrow (2/26) to assess for coronary disease and filling pressures.   Handouts given to the Pt were provided from [facility] Nutrition Care Manual and  included "low sodium nutrition therapy and sodium free flavoring tips.  Discussed the importance of heart healthy lifestyle on <2g or less a day sodium, and low sodium options. Pt was able to address sodium intake to the high amount of fast foods consumed. Pt also concerned about the vegan diet and reversing the CHF disease. Educated on the importance of a fresh fruit and vegetable lifestyle and the potential difficulty transitioning from the current diet to a vegan diet as well as using a general healthier eating pattern to manage a chronic disease. Teach back method was used, pt was able to identify high sodium foods in current diet, 2 gram per day, and low sodium options and serving amounts, and stated she was going to start meal preping.   Pt expected compliance is good. Pt and cousin in room seemed to be highly motivated for change and reducing sodium. Pt states, "if it keeps he healthy, I will cut the fast food, period."   Pt meal completion is <50% RN reports that she only ate a banana for breakfast and only a couple bits of her chicken sandwich. Pt seemed agreeable to trying an ensure, but stated she would be more willing to try something like slim-fast, vs. Ensure.   Labs reviewed.  Medications reviewed and include:  Heparin Adult Infusion 100units/mL (sodium chloride 0.45%) at 15 mL/hr     NUTRITION - FOCUSED PHYSICAL EXAM:    Most Recent Value  Orbital Region  No depletion  Upper Arm Region  No depletion  Thoracic and Lumbar Region  No depletion  Buccal Region  No depletion  Temple Region  No depletion  Clavicle Bone Region  No depletion  Clavicle and Acromion Bone Region  No depletion  Scapular Bone Region  No depletion  Dorsal Hand  No depletion  Patellar Region  No depletion  Anterior Thigh Region  No depletion  Posterior Calf Region  No depletion  Edema (RD Assessment)  None  Hair  Reviewed  Eyes  Reviewed  Mouth  Reviewed  Skin  Reviewed  Nails  Reviewed        Diet Order:   Diet Order            Diet NPO time specified Except for: Sips with Meds  Diet effective midnight        Diet Heart Room service appropriate? Yes; Fluid consistency: Thin  Diet effective now              EDUCATION NEEDS:   Education needs have been addressed  Skin:  Skin Assessment: Reviewed RN Assessment  Last BM:  2/23  Height:   Ht Readings from Last 1 Encounters:  08/28/18 5\' 1"  (1.549 m)    Weight:   Wt Readings from Last 1 Encounters:  08/29/18 89.3 kg    Ideal Body Weight:  47.7 kg  BMI:  Body mass index is 37.2 kg/m.  Estimated Nutritional Needs:   Kcal:  1900-2200  Protein:  95-110 grams  Fluid:  >2L    Burnard Bunting, [facility] Dietetic Intern

## 2018-08-30 ENCOUNTER — Encounter (HOSPITAL_COMMUNITY): Payer: Self-pay | Admitting: Cardiology

## 2018-08-30 ENCOUNTER — Encounter (HOSPITAL_COMMUNITY)
Admission: EM | Disposition: A | Discharge status: Home / Self Care | Payer: Self-pay | Source: Home / Self Care | Attending: Cardiology

## 2018-08-30 DIAGNOSIS — I509 Heart failure, unspecified: Secondary | ICD-10-CM

## 2018-08-30 HISTORY — PX: RIGHT/LEFT HEART CATH AND CORONARY ANGIOGRAPHY: CATH118266

## 2018-08-30 LAB — POCT I-STAT EG7
Acid-base deficit: 1 mmol/L (ref 0.0–2.0)
Bicarbonate: 23.3 mmol/L (ref 20.0–28.0)
Bicarbonate: 24.4 mmol/L (ref 20.0–28.0)
Calcium, Ion: 1.11 mmol/L — ABNORMAL LOW (ref 1.15–1.40)
Calcium, Ion: 1.19 mmol/L (ref 1.15–1.40)
HCT: 34 % — ABNORMAL LOW (ref 36.0–46.0)
HCT: 34 % — ABNORMAL LOW (ref 36.0–46.0)
Hemoglobin: 11.6 g/dL — ABNORMAL LOW (ref 12.0–15.0)
Hemoglobin: 11.6 g/dL — ABNORMAL LOW (ref 12.0–15.0)
O2 Saturation: 65 %
O2 Saturation: 71 %
Potassium: 3.3 mmol/L — ABNORMAL LOW (ref 3.5–5.1)
Potassium: 3.5 mmol/L (ref 3.5–5.1)
Sodium: 141 mmol/L (ref 135–145)
Sodium: 141 mmol/L (ref 135–145)
TCO2: 24 mmol/L (ref 22–32)
TCO2: 26 mmol/L (ref 22–32)
pCO2, Ven: 37.7 mmHg — ABNORMAL LOW (ref 44.0–60.0)
pCO2, Ven: 39.4 mmHg — ABNORMAL LOW (ref 44.0–60.0)
pH, Ven: 7.399 (ref 7.250–7.430)
pH, Ven: 7.399 (ref 7.250–7.430)
pO2, Ven: 34 mmHg (ref 32.0–45.0)
pO2, Ven: 37 mmHg (ref 32.0–45.0)

## 2018-08-30 LAB — BASIC METABOLIC PANEL
Anion gap: 14 (ref 5–15)
BUN: 13 mg/dL (ref 6–20)
CO2: 23 mmol/L (ref 22–32)
Calcium: 9.2 mg/dL (ref 8.9–10.3)
Chloride: 103 mmol/L (ref 98–111)
Creatinine, Ser: 0.87 mg/dL (ref 0.44–1.00)
GFR calc Af Amer: >60 mL/min (ref >60)
GFR calc non Af Amer: >60 mL/min (ref >60)
Glucose, Bld: 89 mg/dL (ref 70–99)
Potassium: 3.7 mmol/L (ref 3.5–5.1)
Sodium: 140 mmol/L (ref 135–145)

## 2018-08-30 LAB — CBC
HCT: 39.3 % (ref 36.0–46.0)
Hemoglobin: 12.8 g/dL (ref 12.0–15.0)
MCH: 28.3 pg (ref 26.0–34.0)
MCHC: 32.6 g/dL (ref 30.0–36.0)
MCV: 86.9 fL (ref 80.0–100.0)
Platelets: 502 10*3/uL — ABNORMAL HIGH (ref 150–400)
RBC: 4.52 MIL/uL (ref 3.87–5.11)
RDW: 12.6 % (ref 11.5–15.5)
WBC: 6.7 10*3/uL (ref 4.0–10.5)
nRBC: 0 % (ref 0.0–0.2)

## 2018-08-30 LAB — MAGNESIUM: Magnesium: 2.1 mg/dL (ref 1.7–2.4)

## 2018-08-30 LAB — HEPARIN LEVEL (UNFRACTIONATED): Heparin Unfractionated: 0.45 IU/mL (ref 0.30–0.70)

## 2018-08-30 SURGERY — RIGHT/LEFT HEART CATH AND CORONARY ANGIOGRAPHY
Anesthesia: LOCAL

## 2018-08-30 MED ORDER — FENTANYL CITRATE (PF) 100 MCG/2ML IJ SOLN
INTRAMUSCULAR | Status: DC | PRN
  Administered 2018-08-30 (×2): 25 ug via INTRAVENOUS

## 2018-08-30 MED ORDER — SPIRONOLACTONE 25 MG PO TABS
25.0000 mg | ORAL_TABLET | Freq: Every day | ORAL | Status: DC
  Administered 2018-08-30 – 2018-09-03 (×5): 25 mg via ORAL
  Filled 2018-08-30 (×4): qty 1

## 2018-08-30 MED ORDER — COUMADIN BOOK
Freq: Once | Status: DC
  Filled 2018-08-30: qty 1

## 2018-08-30 MED ORDER — SODIUM CHLORIDE 0.9 % IV SOLN: 250.0000 mL | INTRAVENOUS | Status: DC | PRN

## 2018-08-30 MED ORDER — HEPARIN (PORCINE) IN NACL 1000-0.9 UT/500ML-% IV SOLN
INTRAVENOUS | Status: AC
  Filled 2018-08-30: qty 500

## 2018-08-30 MED ORDER — VERAPAMIL HCL 2.5 MG/ML IV SOLN
INTRAVENOUS | Status: AC
  Filled 2018-08-30: qty 2

## 2018-08-30 MED ORDER — MIDAZOLAM HCL 2 MG/2ML IJ SOLN
INTRAMUSCULAR | Status: AC
  Filled 2018-08-30: qty 2

## 2018-08-30 MED ORDER — HEPARIN (PORCINE) 25000 UT/250ML-% IV SOLN
1400.0000 [IU]/h | INTRAVENOUS | Status: DC
  Administered 2018-08-30 – 2018-08-31 (×3): 1500 [IU]/h via INTRAVENOUS
  Administered 2018-09-02: 1400 [IU]/h via INTRAVENOUS
  Filled 2018-08-30 (×5): qty 250

## 2018-08-30 MED ORDER — VERAPAMIL HCL 2.5 MG/ML IV SOLN
INTRAVENOUS | Status: DC | PRN
  Administered 2018-08-30: 10 mL via INTRA_ARTERIAL

## 2018-08-30 MED ORDER — LIDOCAINE HCL (PF) 1 % IJ SOLN
INTRAMUSCULAR | Status: AC
  Filled 2018-08-30: qty 30

## 2018-08-30 MED ORDER — ONDANSETRON HCL 4 MG/2ML IJ SOLN: 4.0000 mg | Freq: Four times a day (QID) | INTRAMUSCULAR | Status: DC | PRN

## 2018-08-30 MED ORDER — MIDAZOLAM HCL 2 MG/2ML IJ SOLN
INTRAMUSCULAR | Status: DC | PRN
  Administered 2018-08-30 (×3): 1 mg via INTRAVENOUS

## 2018-08-30 MED ORDER — SODIUM CHLORIDE 0.9% FLUSH: 3.0000 mL | INTRAVENOUS | Status: DC | PRN

## 2018-08-30 MED ORDER — WARFARIN - PHARMACIST DOSING INPATIENT
Freq: Every day | Status: DC
  Administered 2018-08-31 – 2018-09-01 (×2)

## 2018-08-30 MED ORDER — CARVEDILOL 3.125 MG PO TABS
3.1250 mg | ORAL_TABLET | Freq: Two times a day (BID) | ORAL | Status: DC
  Administered 2018-08-30 – 2018-08-31 (×4): 3.125 mg via ORAL
  Filled 2018-08-30 (×4): qty 1

## 2018-08-30 MED ORDER — HEPARIN SODIUM (PORCINE) 1000 UNIT/ML IJ SOLN
INTRAMUSCULAR | Status: AC
  Filled 2018-08-30: qty 1

## 2018-08-30 MED ORDER — SODIUM CHLORIDE 0.9 % IV SOLN: INTRAVENOUS | Status: AC

## 2018-08-30 MED ORDER — LIDOCAINE HCL (PF) 1 % IJ SOLN
INTRAMUSCULAR | Status: DC | PRN
  Administered 2018-08-30: 5 mL

## 2018-08-30 MED ORDER — IOHEXOL 350 MG/ML SOLN
INTRAVENOUS | Status: DC | PRN
  Administered 2018-08-30: 50 mL via INTRA_ARTERIAL

## 2018-08-30 MED ORDER — SODIUM CHLORIDE 0.9% FLUSH
3.0000 mL | Freq: Two times a day (BID) | INTRAVENOUS | Status: DC
  Administered 2018-08-31 – 2018-09-03 (×4): 3 mL via INTRAVENOUS

## 2018-08-30 MED ORDER — HEPARIN (PORCINE) IN NACL 1000-0.9 UT/500ML-% IV SOLN
INTRAVENOUS | Status: DC | PRN
  Administered 2018-08-30 (×2): 500 mL

## 2018-08-30 MED ORDER — HEPARIN SODIUM (PORCINE) 1000 UNIT/ML IJ SOLN
INTRAMUSCULAR | Status: DC | PRN
  Administered 2018-08-30: 4500 [IU] via INTRAVENOUS

## 2018-08-30 MED ORDER — WARFARIN SODIUM 7.5 MG PO TABS
7.5000 mg | ORAL_TABLET | Freq: Once | ORAL | Status: AC
  Administered 2018-08-30: 7.5 mg via ORAL
  Filled 2018-08-30: qty 1

## 2018-08-30 MED ORDER — FENTANYL CITRATE (PF) 100 MCG/2ML IJ SOLN
INTRAMUSCULAR | Status: AC
  Filled 2018-08-30: qty 2

## 2018-08-30 MED ORDER — ACETAMINOPHEN 325 MG PO TABS
650.0000 mg | ORAL_TABLET | ORAL | Status: DC | PRN
  Administered 2018-09-03: 650 mg via ORAL
  Filled 2018-08-30: qty 2

## 2018-08-30 SURGICAL SUPPLY — 11 items

## 2018-08-30 NOTE — Progress Notes (Signed)
Patient ID: Sheila Estes, female   DOB: 1990/12/01, 28 y.o.   MRN: [id number]     Advanced Heart Failure Rounding Note  PCP-Cardiologist: No primary care provider on file.   Subjective:    Breathing improved, no complaints today.   Cardiac MRI:  1. The LV was mildly dilated with EF 27%. Wall motion abnormalities as noted above, in a LAD-territory infarction pattern. Delayed enhancement images suggest a dense LAD-territory infarct, the myocardium is unlikely to be viable. There is an LV apical thrombus. 2.  Mildly dilated RV with apical akinesis and overall EF 31%.  RHC/LHC: Coronary Findings  Diagnostic  Dominance: Right  Left Main  No left main, separate ostia for LAD and LCx.  Left Anterior Descending  The LAD is subtotally occluded proximally after a septal perforator. There is TIMI 2 flow in the mid to distal LAD. The appearance of the LAD suggests SCAD.  Left Circumflex  No significant disease.  Right Coronary Artery  No significant disease.  Intervention   No interventions have been documented.  Right Heart   Right Heart Pressures RHC Procedural Findings: Hemodynamics (mmHg) RA mean 1 RV 35/3 PA 36/8, mean 23 PCWP mean 12 LV 109/19 AO 107/79  Oxygen saturations: PA 67% AO 98%  Cardiac Output (Fick) 4.62  Cardiac Index (Fick) 2.46     Objective:   Weight Range: 88 kg Body mass index is 36.66 kg/m.   Vital Signs:   Temp:  [98 F (36.7 C)-99.5 F (37.5 C)] 99.5 F (37.5 C) (02/26 0512) Pulse Rate:  [37-113] 37 (02/26 0951) Resp:  [10-64] 40 (02/26 0951) BP: (107-147)/(63-90) 121/81 (02/26 0942) SpO2:  [0 %-100 %] 0 % (02/26 0951) Weight:  [88 kg] 88 kg (02/26 0512) Last BM Date: 08/29/18  Weight change: Filed Weights   08/28/18 1740 08/29/18 0514 08/30/18 0512  Weight: 90.3 kg 89.3 kg 88 kg    Intake/Output:   Intake/Output Summary (Last 24 hours) at 08/30/2018 1003 Last data filed at 08/30/2018 0343 Gross per 24 hour  Intake 1083.71  ml  Output 600 ml  Net 483.71 ml      Physical Exam    General:  Well appearing. No resp difficulty HEENT: Normal Neck: Supple. JVP . Carotids 2+ bilat; no bruits. No lymphadenopathy or thyromegaly appreciated. Cor: PMI nondisplaced. Regular rate & rhythm. No rubs, gallops or murmurs. Lungs: Clear Abdomen: Soft, nontender, nondistended. No hepatosplenomegaly. No bruits or masses. Good bowel sounds. Extremities: No cyanosis, clubbing, rash, edema Neuro: Alert & orientedx3, cranial nerves grossly intact. moves all 4 extremities w/o difficulty. Affect pleasant   Telemetry   Sinus tachy 100, personally reviewed.   Labs    CBC Recent Labs    08/28/18 0727 08/30/18 0530  WBC 7.9 6.7  NEUTROABS 4.8  --   HGB 11.7* 12.8  HCT 35.7* 39.3  MCV 90.6 86.9  PLT 442* 502*   Basic Metabolic Panel Recent Labs    40/98/11 0009 08/30/18 0530  NA 140 140  K 3.5 3.7  CL 106 103  CO2 22 23  GLUCOSE 95 89  BUN 6 13  CREATININE 0.83 0.87  CALCIUM 9.0 9.2  MG  --  2.1   Liver Function Tests Recent Labs    08/28/18 1031  AST 15  ALT 16  ALKPHOS 33*  BILITOT 0.7  PROT 7.2  ALBUMIN 3.1*   No results for input(s): LIPASE, AMYLASE in the last 72 hours. Cardiac Enzymes Recent Labs    08/28/18 1837 08/29/18  0009  TROPONINI 0.33* 0.28*    BNP: BNP (last 3 results) Recent Labs    08/28/18 1031  BNP 639.6*    ProBNP (last 3 results) No results for input(s): PROBNP in the last 8760 hours.   D-Dimer Recent Labs    08/28/18 0727  DDIMER 6.52*   Hemoglobin A1C No results for input(s): HGBA1C in the last 72 hours. Fasting Lipid Panel No results for input(s): CHOL, HDL, LDLCALC, TRIG, CHOLHDL, LDLDIRECT in the last 72 hours. Thyroid Function Tests Recent Labs    08/29/18 1422  TSH 2.144    Other results:   Imaging    Mr Cardiac Morphology W Wo Contrast  Result Date: 08/29/2018 CLINICAL DATA:  Cardiomyopathy of uncertain etiology. EXAM: CARDIAC MRI  TECHNIQUE: The patient was scanned on a 1.5 Tesla GE magnet. A dedicated cardiac coil was used. Functional imaging was done using Fiesta sequences. 2,3, and 4 chamber views were done to assess for RWMA's. Modified Simpson's rule using a short axis stack was used to calculate an ejection fraction on a dedicated work [employer]. The patient received 8 cc of Gadavist. After 10 minutes inversion recovery sequences were used to assess for infiltration and scar tissue. FINDINGS: Trivial bilateral pleural effusions. Small circumferential pericardial effusion. Bilateral airspace disease better visualized on chest CT this admission. Mildly dilated left ventricle with normal wall thickness. LV EF 27%. Mid to apical anteroseptal, inferoseptal, and anterior akinesis, apical inferior and apical lateral akinesis, akinesis of the true apex. LV apical thrombus noted. Mildly dilated right ventricle with moderately decreased systolic function, EF 31%. The apex of the RV is akinetic. Mildly dilated left atrium. Normal right atrial size. Trileaflet aortic valve with no stenosis or regurgitation. No significant mitral regurgitation noted. On delayed enhancement images, the same segments that are akinetic have 76-99% wall thickness subendocardial late gadolinium enhancement (LGE): Mid to apical anteroseptal wall/inferoseptal wall/anterior wall, apical inferior wall/lateral wall, true apex. Measurements: LVEDV 193 mL LVSV 53 mL LVEF 27% RVEDV 110 mL RVSV 34 mL RVEF 31% IMPRESSION: 1. The LV was mildly dilated with EF 27%. Wall motion abnormalities as noted above, in a LAD-territory infarction pattern. Delayed enhancement images suggest a dense LAD-territory infarct, the myocardium is unlikely to be viable. There is an LV apical thrombus. 2.  Mildly dilated RV with apical akinesis and overall EF 31%.   Electronically Signed   By: Marca Ancona M.D.   On: 08/29/2018 17:57      Medications:      Scheduled Medications: . aspirin EC  81 mg Oral Daily  . atorvastatin  80 mg Oral q1800  . buPROPion  100 mg Oral BID  . carvedilol  3.125 mg Oral BID WC  . citalopram  20 mg Oral Daily  . feeding supplement (ENSURE ENLIVE)  237 mL Oral BID BM  . losartan  25 mg Oral Daily  . sodium chloride flush  3 mL Intravenous Q12H  . spironolactone  25 mg Oral Daily     Infusions: . sodium chloride    . heparin 1,500 Units/hr (08/30/18 0008)     PRN Medications:  sodium chloride, acetaminophen, albuterol, ALPRAZolam, benzonatate, loratadine, ondansetron (ZOFRAN) IV, sodium chloride flush   Assessment/Plan   1. Acute systolic CHF: Patient presented with new-onset cardiomyopathy, echo with EF 25-30% with peri-apical akinesis and LV thrombus.  Based on her symptoms and the presence of a thrombus, I suspect this process has been present for at least a month.  LHC/RHC showed  normal filling pressures and preserved cardiac output.  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  Cardiac MRI suggested that LAD territory was infarcted and unlikely to be viable with restoration of perfusion. She is now euvolemic and no longer dyspneic.  - Increase spironolactone to 25 mg daily.  - Continue losartan 25 mg daily, can transition to Entresto if BP tolerates.   - Cardiac output preserved, will start Coreg 3.125 mg bid today.  - Eventually, would like her on Bidil.  - As above, LAD territory is unlikely to be viable based on MRI, so would not pursue CTO procedure or CABG.  - Suspect MI about a month ago, not revascularized.  Will need to discuss with EP re: Lifevest versus directly to ICD.  2. LV thrombus: Resume IV heparin gtt and bridge to therapeutic warfarin.   3. Elevated troponin: TnI peaked at 0.33.  Doubt ACS.  Suspect demand ischemia from volume overload.   Length of Stay: 2  Marca Ancona, MD  08/30/2018, 10:03 AM  Advanced Heart Failure Team Pager [phone]  (M-F; 7a - 4p)  Please contact [facility] for night-coverage after hours (4p -7a ) and weekends on [email or web address]

## 2018-08-30 NOTE — Progress Notes (Signed)
ANTICOAGULATION CONSULT NOTE  Pharmacy Consult for IV heparin + warfarin Indication: LV thrombus  Allergies  Allergen Reactions  . Other Shortness Of Breath    Environmental allergies  . Gluten Meal     Gluten makes her wheeze    Patient Measurements: Height: 5\' 1"  (154.9 cm) Weight: 194 lb (88 kg)(scale a) IBW/kg (Calculated) : 47.8 Heparin Dosing Weight: 70 kg  Vital Signs: Temp: 99.5 F (37.5 C) (02/26 0512) Temp Source: Oral (02/26 0512) BP: 110/79 (02/26 0512) Pulse Rate: 96 (02/26 0512)  Labs: Recent Labs    08/28/18 0727 08/28/18 1837  08/29/18 0009 08/29/18 0723 08/29/18 1422 08/30/18 0530  HGB 11.7*  --   --   --   --   --  12.8  HCT 35.7*  --   --   --   --   --  39.3  PLT 442*  --   --   --   --   --  502*  LABPROT  --   --   --  14.4  --   --   --   INR  --   --   --  1.1  --   --   --   HEPARINUNFRC  --   --    < > 0.21* 0.48 0.37 0.45  CREATININE 0.76  --   --  0.83  --   --  0.87  TROPONINI  --  0.33*  --  0.28*  --   --   --    < > = values in this interval not displayed.    Estimated Creatinine Clearance: 96.2 mL/min (by C-G formula based on SCr of 0.87 mg/dL).   Medical History: Past Medical History:  Diagnosis Date  . Abnormal vaginal Pap smear   . Anxiety   . Asthma    HX - rarely uses inhaler - seasonal  . Bipolar 1 disorder (HCC)   . Depression   . History of depression   . History of trichomoniasis   . Hypertension   . Pneumonia    CHILDHOOD    Medications:  Medications Prior to Admission  Medication Sig Dispense Refill Last Dose  . albuterol (PROVENTIL HFA;VENTOLIN HFA) 108 (90 Base) MCG/ACT inhaler Inhale 2 puffs into the lungs every 6 (six) hours as needed for wheezing or shortness of breath.   08/27/2018 at Unknown time  . amoxicillin-clavulanate (AUGMENTIN) 875-125 MG tablet Take 1 tablet by mouth every 12 (twelve) hours. 14 tablet 0 08/27/2018 at Unknown time  . benzonatate (TESSALON) 100 MG capsule Take 2 capsules (200  mg total) by mouth 2 (two) times daily as needed for cough. 20 capsule 0 08/27/2018 at Unknown time  . buPROPion (WELLBUTRIN) 100 MG tablet Take 100 mg by mouth 2 (two) times daily.    08/27/2018 at Unknown time  . citalopram (CELEXA) 20 MG tablet Take 20 mg by mouth daily.   08/27/2018 at Unknown time  . ibuprofen (ADVIL,MOTRIN) 200 MG tablet Take 200 mg by mouth daily as needed for headache.    08/27/2018 at Unknown time  . loratadine (CLARITIN) 10 MG tablet Take 10 mg by mouth daily as needed for allergies.   08/27/2018 at Unknown time   Scheduled:  . aspirin EC  81 mg Oral Daily  . atorvastatin  80 mg Oral q1800  . buPROPion  100 mg Oral BID  . citalopram  20 mg Oral Daily  . feeding supplement (ENSURE ENLIVE)  237 mL Oral BID BM  .  furosemide  60 mg Intravenous BID  . losartan  25 mg Oral Daily  . sodium chloride flush  3 mL Intravenous Q12H  . sodium chloride flush  3 mL Intravenous Q12H  . spironolactone  12.5 mg Oral Daily   Assessment: 80 yoF with Hx anxiety, asthma, bipolar, depression, obesity, presenting with progressive SOB x 1 month. D-dimer elevated but CTA negative for PE. 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 Pharmacy to dose heparin IV.  Heparin level came back therapeutic at 0.45, on 1500 units/hr. Hgb 12.8, plt 502. No s/sx of bleeding. No infusion issues.   Underwent R/L cath on 2/26 showing subtotal occlusion of proxLAD (possibly SCAD) and normal filling pressures with preserved cardiac output. Plan to restart heparin 8 hours after sheath removal (Removal Date/Time: 08/30/18 0947) and to start warfarin therapy. INR yesterday was 1.1.   Goal of Therapy: Heparin level 0.3-0.7 units/ml INR goal: 2-3 Monitor platelets by anticoagulation protocol: Yes  Plan: Restart heparin infusion at 1500 units/hr on 2/26 at 1800 Order warfarin 7.5 mg tonight Daily CBC, heparin level and INR daily Monitor for signs of bleeding or thrombosis  Sherron Monday, PharmD,  BCCCP Clinical Pharmacist  Pager: [phone] Phone: [phone] 08/30/2018, 7:24 AM

## 2018-08-30 NOTE — Plan of Care (Signed)
  Problem: Education: Goal: Knowledge of General Education information will improve Description Including pain rating scale, medication(s)/side effects and non-pharmacologic comfort measures Outcome: Progressing   Problem: Health Behavior/Discharge Planning: Goal: Ability to manage health-related needs will improve Outcome: Progressing   Problem: Clinical Measurements: Goal: Ability to maintain clinical measurements within normal limits will improve Outcome: Progressing   Problem: Clinical Measurements: Goal: Diagnostic test results will improve Outcome: Progressing   Problem: Nutrition: Goal: Adequate nutrition will be maintained Outcome: Progressing   Problem: Skin Integrity: Goal: Risk for impaired skin integrity will decrease Outcome: Progressing

## 2018-08-30 NOTE — Discharge Instructions (Addendum)
Heart Healthy Low salt diet Weigh daily and call the heart failure team for 3 lb wt gain in a day or 5 lbs in a week.   Call if any questions or any difficulty obtaining medications  See you primary MD about the Wellbutrin and Celexa we have stopped  Wear you life vest   You may return to work.     Information on my medicine - Coumadin   (Warfarin)  Why was Coumadin prescribed for you? Coumadin was prescribed for you because you have a blood clot or a medical condition that can cause an increased risk of forming blood clots. Blood clots can cause serious health problems by blocking the flow of blood to the heart, lung, or brain. Coumadin can prevent harmful blood clots from forming. As a reminder your indication for Coumadin is:  LV thrombus  What test will check on my response to Coumadin? While on Coumadin (warfarin) you will need to have an INR test regularly to ensure that your dose is keeping you in the desired range. The INR (international normalized ratio) number is calculated from the result of the laboratory test called prothrombin time (PT).  If an INR APPOINTMENT HAS NOT ALREADY BEEN MADE FOR YOU please schedule an appointment to have this lab work done by your health care provider within 7 days. Your INR goal is usually a number between:  2 to 3 or your provider may give you a more narrow range like 2-2.5.  Ask your health care provider during an office visit what your goal INR is.  What  do you need to  know  About  COUMADIN? Take Coumadin (warfarin) exactly as prescribed by your healthcare provider about the same time each day.  DO NOT stop taking without talking to the doctor who prescribed the medication.  Stopping without other blood clot prevention medication to take the place of Coumadin may increase your risk of developing a new clot or stroke.  Get refills before you run out.  What do you do if you miss a dose? If you miss a dose, take it as soon as you remember on  the same day then continue your regularly scheduled regimen the next day.  Do not take two doses of Coumadin at the same time.  Important Safety Information A possible side effect of Coumadin (Warfarin) is an increased risk of bleeding. You should call your healthcare provider right away if you experience any of the following: ? Bleeding from an injury or your nose that does not stop. ? Unusual colored urine (red or dark brown) or unusual colored stools (red or black). ? Unusual bruising for unknown reasons. ? A serious fall or if you hit your head (even if there is no bleeding).  Some foods or medicines interact with Coumadin (warfarin) and might alter your response to warfarin. To help avoid this: ? Eat a balanced diet, maintaining a consistent amount of Vitamin K. ? Notify your provider about major diet changes you plan to make. ? Avoid alcohol or limit your intake to 1 drink for women and 2 drinks for men per day. (1 drink is 5 oz. wine, 12 oz. beer, or 1.5 oz. liquor.)  Make sure that ANY health care provider who prescribes medication for you knows that you are taking Coumadin (warfarin).  Also make sure the healthcare provider who is monitoring your Coumadin knows when you have started a new medication including herbals and non-prescription products.  Coumadin (Warfarin)  Major Drug  Interactions  Increased Warfarin Effect Decreased Warfarin Effect  Alcohol (large quantities) Antibiotics (esp. Septra/Bactrim, Flagyl, Cipro) Amiodarone (Cordarone) Aspirin (ASA) Cimetidine (Tagamet) Megestrol (Megace) NSAIDs (ibuprofen, naproxen, etc.) Piroxicam (Feldene) Propafenone (Rythmol SR) Propranolol (Inderal) Isoniazid (INH) Posaconazole (Noxafil) Barbiturates (Phenobarbital) Carbamazepine (Tegretol) Chlordiazepoxide (Librium) Cholestyramine (Questran) Griseofulvin Oral Contraceptives Rifampin Sucralfate (Carafate) Vitamin K   Coumadin (Warfarin) Major Herbal Interactions    Increased Warfarin Effect Decreased Warfarin Effect  Garlic Ginseng Ginkgo biloba Coenzyme Q10 Green tea St. Johns wort    Coumadin (Warfarin) FOOD Interactions  Eat a consistent number of servings per week of foods HIGH in Vitamin K (1 serving =  cup)  Collards (cooked, or boiled & drained) Kale (cooked, or boiled & drained) Mustard greens (cooked, or boiled & drained) Parsley *serving size only =  cup Spinach (cooked, or boiled & drained) Swiss chard (cooked, or boiled & drained) Turnip greens (cooked, or boiled & drained)  Eat a consistent number of servings per week of foods MEDIUM-HIGH in Vitamin K (1 serving = 1 cup)  Asparagus (cooked, or boiled & drained) Broccoli (cooked, boiled & drained, or raw & chopped) Brussel sprouts (cooked, or boiled & drained) *serving size only =  cup Lettuce, raw (green leaf, endive, romaine) Spinach, raw Turnip greens, raw & chopped   These websites have more information on Coumadin (warfarin):  http://www.king-russell.com/; https://www.hines.net/;

## 2018-08-30 NOTE — Interval H&P Note (Signed)
History and Physical Interval Note:  08/30/2018 9:00 AM  Sheila Estes  has presented today for surgery, with the diagnosis of CHF  The various methods of treatment have been discussed with the patient and family. After consideration of risks, benefits and other options for treatment, the patient has consented to  Procedure(s): RIGHT/LEFT HEART CATH AND CORONARY ANGIOGRAPHY (N/A) as a surgical intervention .  The patient's history has been reviewed, patient examined, no change in status, stable for surgery.  I have reviewed the patient's chart and labs.  Questions were answered to the patient's satisfaction.     [name] [employer]

## 2018-08-31 LAB — BASIC METABOLIC PANEL
Anion gap: 15 (ref 5–15)
BUN: 16 mg/dL (ref 6–20)
CO2: 20 mmol/L — ABNORMAL LOW (ref 22–32)
Calcium: 9 mg/dL (ref 8.9–10.3)
Chloride: 105 mmol/L (ref 98–111)
Creatinine, Ser: 0.82 mg/dL (ref 0.44–1.00)
GFR calc Af Amer: >60 mL/min (ref >60)
GFR calc non Af Amer: >60 mL/min (ref >60)
Glucose, Bld: 83 mg/dL (ref 70–99)
Potassium: 3.4 mmol/L — ABNORMAL LOW (ref 3.5–5.1)
Sodium: 140 mmol/L (ref 135–145)

## 2018-08-31 LAB — HEPARIN LEVEL (UNFRACTIONATED): Heparin Unfractionated: 0.58 IU/mL (ref 0.30–0.70)

## 2018-08-31 LAB — CBC
HCT: 36.8 % (ref 36.0–46.0)
Hemoglobin: 12 g/dL (ref 12.0–15.0)
MCH: 28.9 pg (ref 26.0–34.0)
MCHC: 32.6 g/dL (ref 30.0–36.0)
MCV: 88.7 fL (ref 80.0–100.0)
Platelets: 463 10*3/uL — ABNORMAL HIGH (ref 150–400)
RBC: 4.15 MIL/uL (ref 3.87–5.11)
RDW: 12.7 % (ref 11.5–15.5)
WBC: 5.6 10*3/uL (ref 4.0–10.5)
nRBC: 0 % (ref 0.0–0.2)

## 2018-08-31 LAB — PROTIME-INR
INR: 1.2 (ref 0.8–1.2)
Prothrombin Time: 14.9 seconds (ref 11.4–15.2)

## 2018-08-31 MED ORDER — SACUBITRIL-VALSARTAN 24-26 MG PO TABS
1.0000 | ORAL_TABLET | Freq: Two times a day (BID) | ORAL | Status: DC
  Administered 2018-08-31 – 2018-09-04 (×6): 1 via ORAL
  Filled 2018-08-31 (×8): qty 1

## 2018-08-31 MED ORDER — WARFARIN SODIUM 10 MG PO TABS
10.0000 mg | ORAL_TABLET | Freq: Once | ORAL | Status: AC
  Administered 2018-08-31: 10 mg via ORAL
  Filled 2018-08-31: qty 1

## 2018-08-31 MED ORDER — POTASSIUM CHLORIDE CRYS ER 20 MEQ PO TBCR
40.0000 meq | EXTENDED_RELEASE_TABLET | Freq: Once | ORAL | Status: AC
  Administered 2018-08-31: 40 meq via ORAL
  Filled 2018-08-31: qty 2

## 2018-08-31 NOTE — Progress Notes (Addendum)
Patient ID: Sheila Estes, female   DOB: [birth year], 28 y.o.   MRN: [id number]     Advanced Heart Failure Rounding Note  PCP-Cardiologist: No primary care provider on file.   Subjective:    Yesterday, started on coreg and spiro was increased. K 3.4. Creatinine stable 0.82. INR 1.2. Weight unchanged.  No CP or SOB. Still having a cough with clear sputum. She has lots of questions about LifeVest and ICD. We discussed at length.   Cardiac MRI:  1. The LV was mildly dilated with EF 27%. Wall motion abnormalities as noted above, in a LAD-territory infarction pattern. Delayed enhancement images suggest a dense LAD-territory infarct, the myocardium is unlikely to be viable. There is an LV apical thrombus. 2.  Mildly dilated RV with apical akinesis and overall EF 31%.  RHC/LHC: Coronary Findings  Diagnostic  Dominance: Right  Left Main  No left main, separate ostia for LAD and LCx.  Left Anterior Descending  The LAD is subtotally occluded proximally after a septal perforator. There is TIMI 2 flow in the mid to distal LAD. The appearance of the LAD suggests SCAD.  Left Circumflex  No significant disease.  Right Coronary Artery  No significant disease.  Intervention   No interventions have been documented.  Right Heart   Right Heart Pressures RHC Procedural Findings: Hemodynamics (mmHg) RA mean 1 RV 35/3 PA 36/8, mean 23 PCWP mean 12 LV 109/19 AO 107/79  Oxygen saturations: PA 67% AO 98%  Cardiac Output (Fick) 4.62  Cardiac Index (Fick) 2.46     Objective:   Weight Range: 88 kg Body mass index is 36.66 kg/m.   Vital Signs:   Temp:  [98.2 F (36.8 C)-98.5 F (36.9 C)] 98.4 F (36.9 C) (02/27 0451) Pulse Rate:  [37-103] 93 (02/27 0451) Resp:  [15-49] 18 (02/27 0451) BP: (100-126)/(53-85) 104/63 (02/27 0451) SpO2:  [0 %-98 %] 97 % (02/27 0451) Weight:  [88 kg] 88 kg (02/27 0451) Last BM Date: 08/29/18  Weight change: Filed Weights   08/29/18 0514 08/30/18  0512 08/31/18 0451  Weight: 89.3 kg 88 kg 88 kg    Intake/Output:   Intake/Output Summary (Last 24 hours) at 08/31/2018 0932 Last data filed at 08/31/2018 0847 Gross per 24 hour  Intake 633.87 ml  Output 400 ml  Net 233.87 ml      Physical Exam    General: Well appearing. No resp difficulty. HEENT: Normal Neck: Supple. JVP 6-7. Carotids 2+ bilat; no bruits. No thyromegaly or nodule noted. Cor: PMI nondisplaced. RRR, No M/G/R noted Lungs: CTAB, normal effort. Abdomen: Soft, non-tender, non-distended, no HSM. No bruits or masses. +BS  Extremities: No cyanosis, clubbing, or rash. R and LLE no edema.  Neuro: Alert & orientedx3, cranial nerves grossly intact. moves all 4 extremities w/o difficulty. Affect pleasant  Telemetry   NSR 90s, personally reviewed.   Labs    CBC Recent Labs    08/30/18 0530 08/30/18 0923 08/31/18 0411  WBC 6.7  --  5.6  HGB 12.8 11.6*  11.6* 12.0  HCT 39.3 34.0*  34.0* 36.8  MCV 86.9  --  88.7  PLT 502*  --  463*   Basic Metabolic Panel Recent Labs    78/47/84 0530 08/30/18 0923 08/31/18 0411  NA 140 141  141 140  K 3.7 3.5  3.3* 3.4*  CL 103  --  105  CO2 23  --  20*  GLUCOSE 89  --  83  BUN 13  --  16  CREATININE 0.87  --  0.82  CALCIUM 9.2  --  9.0  MG 2.1  --   --    Liver Function Tests Recent Labs    08/28/18 1031  AST 15  ALT 16  ALKPHOS 33*  BILITOT 0.7  PROT 7.2  ALBUMIN 3.1*   No results for input(s): LIPASE, AMYLASE in the last 72 hours. Cardiac Enzymes Recent Labs    08/28/18 1837 08/29/18 0009  TROPONINI 0.33* 0.28*    BNP: BNP (last 3 results) Recent Labs    08/28/18 1031  BNP 639.6*    ProBNP (last 3 results) No results for input(s): PROBNP in the last 8760 hours.   D-Dimer No results for input(s): DDIMER in the last 72 hours. Hemoglobin A1C No results for input(s): HGBA1C in the last 72 hours. Fasting Lipid Panel No results for input(s): CHOL, HDL, LDLCALC, TRIG, CHOLHDL, LDLDIRECT  in the last 72 hours. Thyroid Function Tests Recent Labs    08/29/18 1422  TSH 2.144    Other results:   Imaging    No results found.   Medications:     Scheduled Medications: . aspirin EC  81 mg Oral Daily  . atorvastatin  80 mg Oral q1800  . carvedilol  3.125 mg Oral BID WC  . coumadin book   Does not apply Once  . feeding supplement (ENSURE ENLIVE)  237 mL Oral BID BM  . losartan  25 mg Oral Daily  . sodium chloride flush  3 mL Intravenous Q12H  . sodium chloride flush  3 mL Intravenous Q12H  . spironolactone  25 mg Oral Daily  . Warfarin - Pharmacist Dosing Inpatient   Does not apply q1800    Infusions: . sodium chloride    . sodium chloride    . heparin 1,500 Units/hr (08/31/18 0419)    PRN Medications: sodium chloride, sodium chloride, acetaminophen, albuterol, ALPRAZolam, benzonatate, loratadine, ondansetron (ZOFRAN) IV, sodium chloride flush, sodium chloride flush   Assessment/Plan   1. Acute systolic CHF: Patient presented with new-onset cardiomyopathy, echo with EF 25-30% with peri-apical akinesis and LV thrombus.  Based on her symptoms and the presence of a thrombus, I suspect this process has been present for at least a month.  LHC/RHC showed normal filling pressures and preserved cardiac output.  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  Cardiac MRI suggested that LAD territory was infarcted and unlikely to be viable with restoration of perfusion. She is now euvolemic and no longer dyspneic.  - Continue spironolactone 25 mg daily.  - Continue losartan 25 mg daily, can transition to Entresto if BP tolerates. SBP 100s.  - Continue Coreg 3.125 mg bid   - Eventually, would like her on Bidil. SBP 100s today. - As above, LAD territory is unlikely to be viable based on MRI, so would not pursue CTO procedure or CABG.  - Suspect MI about a month ago, not revascularized.   - She is agreeable to LifeVest at discharge. Placed DME  order. Will fax paperwork and let rep know.  - Consult cardiac rehab. 2. LV thrombus: Resume IV heparin gtt and bridge to therapeutic warfarin. INR 1.2 3. Elevated troponin: TnI peaked at 0.33.  Doubt ACS.  Suspect demand ischemia from volume overload. No change.   Length of Stay: 3  Alford Highland, NP  08/31/2018, 9:32 AM  Advanced Heart Failure Team Pager [phone] (M-F; 7a - 4p)  Please contact [facility] for night-coverage after hours (4p -  7a ) and weekends on [email or web address]  Patient seen with NP, agree with the above note.   She is doing well, volume status looks ok today.   - Stop losartan and start Entresto this evening, continue other meds.   Long discussion about Lifevest for discharge.  Will repeat echo in 2 months (this will be 3 months post-MI), and if EF remains low (which I suspect it will), will arrange for ICD.   She is on heparin bridge to therapeutic INR on warfarin for LV thrombus.  We discussed Lovenox for home. She says that she cannot give herself the shot and has no one at home to give it to her. She will have to stay until INR 2.   Sheila Estes 08/31/2018 1:22 PM

## 2018-08-31 NOTE — Progress Notes (Signed)
Patient and family at beside asking multiple questions about heart failure and life vest.Patient becoming upset with answers given states," I don't want to hear anymore right now.This is a lot to deal with." This nurse offered patient xanax for anxiety.Will continue to monitor.

## 2018-08-31 NOTE — Progress Notes (Signed)
[name] with LifeVest in to talk to patient about the Sheila Estes [phone]

## 2018-08-31 NOTE — Progress Notes (Signed)
ANTICOAGULATION CONSULT NOTE  Pharmacy Consult for IV heparin + warfarin Indication: LV thrombus  Allergies  Allergen Reactions  . Other Shortness Of Breath    Environmental allergies  . Gluten Meal     Gluten makes her wheeze    Patient Measurements: Height: 5\' 1"  (154.9 cm) Weight: 194 lb (88 kg)(scale a) IBW/kg (Calculated) : 47.8 Heparin Dosing Weight: 70 kg  Vital Signs: Temp: 98.4 F (36.9 C) (02/27 0451) Temp Source: Oral (02/27 0451) BP: 104/63 (02/27 0451) Pulse Rate: 93 (02/27 0451)  Labs: Recent Labs    08/28/18 1837 08/29/18 0009  08/29/18 1422  08/30/18 0530 08/30/18 0923 08/31/18 0411 08/31/18 0916  HGB  --   --   --   --    < > 12.8 11.6*  11.6* 12.0  --   HCT  --   --   --   --   --  39.3 34.0*  34.0* 36.8  --   PLT  --   --   --   --   --  502*  --  463*  --   LABPROT  --  14.4  --   --   --   --   --   --  14.9  INR  --  1.1  --   --   --   --   --   --  1.2  HEPARINUNFRC  --  0.21*   < > 0.37  --  0.45  --   --  0.58  CREATININE  --  0.83  --   --   --  0.87  --  0.82  --   TROPONINI 0.33* 0.28*  --   --   --   --   --   --   --    < > = values in this interval not displayed.    Estimated Creatinine Clearance: 102.1 mL/min (by C-G formula based on SCr of 0.82 mg/dL).   Medical History: Past Medical History:  Diagnosis Date  . Abnormal vaginal Pap smear   . Anxiety   . Asthma    HX - rarely uses inhaler - seasonal  . Bipolar 1 disorder (HCC)   . Depression   . History of depression   . History of trichomoniasis   . Hypertension   . Pneumonia    CHILDHOOD    Medications:  Medications Prior to Admission  Medication Sig Dispense Refill Last Dose  . albuterol (PROVENTIL HFA;VENTOLIN HFA) 108 (90 Base) MCG/ACT inhaler Inhale 2 puffs into the lungs every 6 (six) hours as needed for wheezing or shortness of breath.   08/27/2018 at Unknown time  . amoxicillin-clavulanate (AUGMENTIN) 875-125 MG tablet Take 1 tablet by mouth every 12  (twelve) hours. 14 tablet 0 08/27/2018 at Unknown time  . benzonatate (TESSALON) 100 MG capsule Take 2 capsules (200 mg total) by mouth 2 (two) times daily as needed for cough. 20 capsule 0 08/27/2018 at Unknown time  . buPROPion (WELLBUTRIN) 100 MG tablet Take 100 mg by mouth 2 (two) times daily.    08/27/2018 at Unknown time  . citalopram (CELEXA) 20 MG tablet Take 20 mg by mouth daily.   08/27/2018 at Unknown time  . ibuprofen (ADVIL,MOTRIN) 200 MG tablet Take 200 mg by mouth daily as needed for headache.    08/27/2018 at Unknown time  . loratadine (CLARITIN) 10 MG tablet Take 10 mg by mouth daily as needed for allergies.   08/27/2018 at Unknown time  Scheduled:  . aspirin EC  81 mg Oral Daily  . atorvastatin  80 mg Oral q1800  . carvedilol  3.125 mg Oral BID WC  . coumadin book   Does not apply Once  . feeding supplement (ENSURE ENLIVE)  237 mL Oral BID BM  . losartan  25 mg Oral Daily  . potassium chloride  40 mEq Oral Once  . sodium chloride flush  3 mL Intravenous Q12H  . sodium chloride flush  3 mL Intravenous Q12H  . spironolactone  25 mg Oral Daily  . warfarin  10 mg Oral ONCE-1800  . Warfarin - Pharmacist Dosing Inpatient   Does not apply q1800   Assessment: 17 yoF with Hx anxiety, asthma, bipolar, depression, obesity, presenting with progressive SOB x 1 month. D-dimer elevated but CTA negative for PE. 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 Pharmacy to dose heparin IV.  Heparin level therapeutic at 0.6, on 1500 units/hr. Hgb and pltc stable   No s/sx of bleeding. No infusion issues.  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  Goal of Therapy: Heparin level 0.3-0.7 units/ml INR goal: 2-3 Monitor platelets by anticoagulation protocol: Yes  Plan: Continue heparin infusion at 1500 units/hr  warfarin 10mg  tonight Daily CBC,  heparin level and INR daily Monitor for signs of bleeding or thrombosis  Leota Sauers Pharm.D. CPP, BCPS Clinical Pharmacist [phone] 08/31/2018 10:06 AM

## 2018-08-31 NOTE — Progress Notes (Signed)
CARDIAC REHAB PHASE I   PRE:  Rate/Rhythm: 99 SR    BP: sitting 123/55    SaO2: 100 RA  MODE:  Ambulation: 470 ft   POST:  Rate/Rhythm: 110 ST    BP: sitting 122/71     SaO2: 100 RA  Pt ambulated hallway, tolerated fairly well. Sts she is tired, some SOB. She likes to push herself and hope everything is ok (having a hard time accepting her dx). She wants to exercise to improve her heart. Discussed HF booklet and walking. She declined discussing MI stating "that is negative" but accepted MI book. Interested in Maintenance CR (does not have insurance for CRPII).  Also discussed Vit K as she has collards on her tray. Pt thankful for information but understandably overwhelmed. Encouraged her to read information. Also encouraged her to find stress relief at home. She has already cut out energy drinks, fast food and her second job. Pt can walk unit. [phone]  Harriet Masson CES, ACSM 08/31/2018 3:20 PM

## 2018-09-01 LAB — BASIC METABOLIC PANEL
Anion gap: 9 (ref 5–15)
BUN: 11 mg/dL (ref 6–20)
CO2: 24 mmol/L (ref 22–32)
Calcium: 9.1 mg/dL (ref 8.9–10.3)
Chloride: 107 mmol/L (ref 98–111)
Creatinine, Ser: 0.84 mg/dL (ref 0.44–1.00)
GFR calc Af Amer: >60 mL/min (ref >60)
GFR calc non Af Amer: >60 mL/min (ref >60)
Glucose, Bld: 96 mg/dL (ref 70–99)
Potassium: 3.8 mmol/L (ref 3.5–5.1)
Sodium: 140 mmol/L (ref 135–145)

## 2018-09-01 LAB — CBC
HCT: 38.1 % (ref 36.0–46.0)
Hemoglobin: 12.5 g/dL (ref 12.0–15.0)
MCH: 28.6 pg (ref 26.0–34.0)
MCHC: 32.8 g/dL (ref 30.0–36.0)
MCV: 87.2 fL (ref 80.0–100.0)
Platelets: 431 10*3/uL — ABNORMAL HIGH (ref 150–400)
RBC: 4.37 MIL/uL (ref 3.87–5.11)
RDW: 12.7 % (ref 11.5–15.5)
WBC: 5.2 10*3/uL (ref 4.0–10.5)
nRBC: 0 % (ref 0.0–0.2)

## 2018-09-01 LAB — PROTIME-INR
INR: 1.4 — ABNORMAL HIGH (ref 0.8–1.2)
Prothrombin Time: 16.5 seconds — ABNORMAL HIGH (ref 11.4–15.2)

## 2018-09-01 LAB — HEPARIN LEVEL (UNFRACTIONATED): Heparin Unfractionated: 0.8 IU/mL — ABNORMAL HIGH (ref 0.30–0.70)

## 2018-09-01 LAB — MAGNESIUM: MAGNESIUM: 2.2 mg/dL (ref 1.7–2.4)

## 2018-09-01 MED ORDER — WARFARIN SODIUM 10 MG PO TABS
10.0000 mg | ORAL_TABLET | Freq: Once | ORAL | Status: AC
  Administered 2018-09-01: 10 mg via ORAL
  Filled 2018-09-01: qty 1

## 2018-09-01 MED ORDER — CARVEDILOL 6.25 MG PO TABS
6.2500 mg | ORAL_TABLET | Freq: Two times a day (BID) | ORAL | Status: DC
  Administered 2018-09-01 – 2018-09-03 (×5): 6.25 mg via ORAL
  Filled 2018-09-01 (×5): qty 1

## 2018-09-01 NOTE — Progress Notes (Addendum)
Nutrition Follow-up  DOCUMENTATION CODES:   Obesity unspecified  INTERVENTION:  Reviewed 2g sodium diet   Discontinue Ensure Enlive po BID  Start Snack BID (fruit).  NUTRITION DIAGNOSIS:   Inadequate oral intake related to decreased appetite as evidenced by meal completion < 50%, per patient/family report.  Progressing  GOAL:   Patient will meet greater than or equal to 90% of their needs  Progressing  MONITOR:   PO intake, Weight trends, Labs, I & O's  REASON FOR ASSESSMENT:   Malnutrition Screening Tool(MST 3)    ASSESSMENT:   Pt is a 61y F with PMH of obesity, anxiety, asthma, bipolar disorder and depression with no cardiac history prior. Presented with SOB with New diagnosis of Acute Systolic CHF.    2/26 R/L Heart Cath showing subtotal occlusion of proxLAD possible SCAD and normal filling pressures with preserved cardiac output.   Pt seems in better spirits about new CHF Dx. Pt remembered intern and <2 gram/day sodium diet. Pt states that she still doesn't have much of an appetite, but is trying to eat more. Per chart meal completion has been between 50-75% over the last several meal periods. Pt states the food she has been receiving has been good, but lunch today was less good than usual. She ate 1/2 chicken beast and most of her rice on her meal tray. Pt was wondering if she can have ice cream on tray for diet, that it was offered the other day but not today--if allowed by diet she can have it.  Pt doesn't care for the ensure saying, "that is for people to gain weight." Encouraged pt to drink them with or between her meals to help supplement diet since she isn't back to 100%. Offered magic cup option, but after describing its function and similarity to ensure, pt declined. Pt would rather not have those; pt is agreeable to snacks like fruit, BID.   Pt reports not liking typical snacks on diet like jello, and stating "I cant have the snacks I used to eat".   Per  weight encounters and pt report, she is loosing some of the fluid weight. Pt is concerned about losing 40#, which she states as her goal. Emphazied to Pt, that not eating (even when appetite is low) is not an effective way to lose weight, that she still needed to eat sometime, or she will retain weight.  Pt reports  has been drinking more water, that she has given everything but water up. Pt has 3 8-10oz bottles of water accompanied on meal tray.    NUTRITION - FOCUSED PHYSICAL EXAM:  Diet Order:   Diet Order            Diet Heart Room service appropriate? Yes; Fluid consistency: Thin  Diet effective now              EDUCATION NEEDS:   Education needs have been addressed  Skin:  Skin Assessment: Reviewed RN Assessment  Last BM:  2/25  Height:   Ht Readings from Last 1 Encounters:  08/28/18 5\' 1"  (1.549 m)    Weight:   Wt Readings from Last 1 Encounters:  09/01/18 88.1 kg    Ideal Body Weight:  47.7 kg  BMI:  Body mass index is 36.69 kg/m.  Estimated Nutritional Needs:   Kcal:  1900-2200  Protein:  95-110 grams  Fluid:  >2L    Burnard Bunting, [facility] Dietetic Intern

## 2018-09-01 NOTE — Progress Notes (Signed)
Pt's BP is 96/55. Paged MD and received orders to hold the [facility] for tonight.

## 2018-09-01 NOTE — Progress Notes (Addendum)
Patient ID: Sheila Estes, female   DOB: 1990/10/29, 28 y.o.   MRN: [id number]     Advanced Heart Failure Rounding Note  PCP-Cardiologist: No primary care provider on file.   Subjective:    Remains on heparin/coumadin bridge for LV thrombus. INR 1.4. She refuses lovenox injections.  Started on Entresto 24/26 mg BID last night. SBP 100s. Creatinine 0.84. Weight unchanged.   Feels fine. Denies CP or SOB. Lots of questions about ICD.  Cardiac MRI:  1. The LV was mildly dilated with EF 27%. Wall motion abnormalities as noted above, in a LAD-territory infarction pattern. Delayed enhancement images suggest a dense LAD-territory infarct, the myocardium is unlikely to be viable. There is an LV apical thrombus. 2.  Mildly dilated RV with apical akinesis and overall EF 31%.  RHC/LHC: Coronary Findings  Diagnostic  Dominance: Right  Left Main  No left main, separate ostia for LAD and LCx.  Left Anterior Descending  The LAD is subtotally occluded proximally after a septal perforator. There is TIMI 2 flow in the mid to distal LAD. The appearance of the LAD suggests SCAD.  Left Circumflex  No significant disease.  Right Coronary Artery  No significant disease.  Intervention   No interventions have been documented.  Right Heart   Right Heart Pressures RHC Procedural Findings: Hemodynamics (mmHg) RA mean 1 RV 35/3 PA 36/8, mean 23 PCWP mean 12 LV 109/19 AO 107/79  Oxygen saturations: PA 67% AO 98%  Cardiac Output (Fick) 4.62  Cardiac Index (Fick) 2.46     Objective:   Weight Range: 88.1 kg Body mass index is 36.69 kg/m.   Vital Signs:   Temp:  [98 F (36.7 C)-98.4 F (36.9 C)] 98.4 F (36.9 C) (02/28 0550) Pulse Rate:  [86-98] 86 (02/28 0550) Resp:  [18-20] 18 (02/28 0550) BP: (104-121)/(62-76) 104/72 (02/28 0550) SpO2:  [94 %-100 %] 99 % (02/28 0550) Weight:  [88.1 kg] 88.1 kg (02/28 0550) Last BM Date: 08/29/18  Weight change: Filed Weights   08/30/18  0512 08/31/18 0451 09/01/18 0550  Weight: 88 kg 88 kg 88.1 kg    Intake/Output:   Intake/Output Summary (Last 24 hours) at 09/01/2018 0851 Last data filed at 09/01/2018 0600 Gross per 24 hour  Intake 625.44 ml  Output -  Net 625.44 ml      Physical Exam    General: Well appearing. No resp difficulty. HEENT: Normal Neck: Supple. JVP 5-6. Carotids 2+ bilat; no bruits. No thyromegaly or nodule noted. Cor: PMI nondisplaced. RRR, No M/G/R noted Lungs: CTAB, normal effort. Abdomen: Soft, non-tender, non-distended, no HSM. No bruits or masses. +BS  Extremities: No cyanosis, clubbing, or rash. R and LLE no edema.  Neuro: Alert & orientedx3, cranial nerves grossly intact. moves all 4 extremities w/o difficulty. Affect pleasant  Telemetry   NSR 80-90s personally reviewed.   Labs    CBC Recent Labs    08/31/18 0411 09/01/18 0527  WBC 5.6 5.2  HGB 12.0 12.5  HCT 36.8 38.1  MCV 88.7 87.2  PLT 463* 431*   Basic Metabolic Panel Recent Labs    40/98/11 0530  08/31/18 0411 09/01/18 0527  NA 140   < > 140 140  K 3.7   < > 3.4* 3.8  CL 103  --  105 107  CO2 23  --  20* 24  GLUCOSE 89  --  83 96  BUN 13  --  16 11  CREATININE 0.87  --  0.82 0.84  CALCIUM 9.2  --  9.0 9.1  MG 2.1  --   --  2.2   < > = values in this interval not displayed.   Liver Function Tests No results for input(s): AST, ALT, ALKPHOS, BILITOT, PROT, ALBUMIN in the last 72 hours. No results for input(s): LIPASE, AMYLASE in the last 72 hours. Cardiac Enzymes No results for input(s): CKTOTAL, CKMB, CKMBINDEX, TROPONINI in the last 72 hours.  BNP: BNP (last 3 results) Recent Labs    08/28/18 1031  BNP 639.6*    ProBNP (last 3 results) No results for input(s): PROBNP in the last 8760 hours.   D-Dimer No results for input(s): DDIMER in the last 72 hours. Hemoglobin A1C No results for input(s): HGBA1C in the last 72 hours. Fasting Lipid Panel No results for input(s): CHOL, HDL, LDLCALC,  TRIG, CHOLHDL, LDLDIRECT in the last 72 hours. Thyroid Function Tests Recent Labs    08/29/18 1422  TSH 2.144    Other results:   Imaging    No results found.   Medications:     Scheduled Medications: . aspirin EC  81 mg Oral Daily  . atorvastatin  80 mg Oral q1800  . carvedilol  3.125 mg Oral BID WC  . coumadin book   Does not apply Once  . feeding supplement (ENSURE ENLIVE)  237 mL Oral BID BM  . sacubitril-valsartan  1 tablet Oral BID  . sodium chloride flush  3 mL Intravenous Q12H  . sodium chloride flush  3 mL Intravenous Q12H  . spironolactone  25 mg Oral Daily  . Warfarin - Pharmacist Dosing Inpatient   Does not apply q1800    Infusions: . sodium chloride    . sodium chloride    . heparin 1,500 Units/hr (08/31/18 1949)    PRN Medications: sodium chloride, sodium chloride, acetaminophen, albuterol, ALPRAZolam, benzonatate, loratadine, ondansetron (ZOFRAN) IV, sodium chloride flush, sodium chloride flush   Assessment/Plan   1. Acute systolic CHF: Patient presented with new-onset cardiomyopathy, echo with EF 25-30% with peri-apical akinesis and LV thrombus.  Based on her symptoms and the presence of a thrombus, I suspect this process has been present for at least a month.  LHC/RHC showed normal filling pressures and preserved cardiac output.  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  Cardiac MRI suggested that LAD territory was infarcted and unlikely to be viable with restoration of perfusion.  - Volume stable.  - Continue spironolactone 25 mg daily.  - Continue Entresto 24/26 mg BID. SBP 100s.  - Increase Coreg 6.25 mg bid   - Eventually, would like her on Bidil. SBP 100s today. - As above, LAD territory is unlikely to be viable based on MRI, so would not pursue CTO procedure or CABG.  - Suspect MI about a month ago, not revascularized.   - Agreeable to LifeVest at discharge. DME and fax in. Rep aware.  - Cardiac rehab  following.  2. LV thrombus: Continue heparin gtt and bridge to therapeutic warfarin. INR 1.4. Refuses lovenox, so will have to stay until INR >2.0 - Will need to set up with coumadin clinic.  3. Elevated troponin: TnI peaked at 0.33.  Doubt ACS.  Suspect demand ischemia from volume overload. No change.   Length of Stay: 4  Alford Highland, NP  09/01/2018, 8:51 AM  Advanced Heart Failure Team Pager [phone] (M-F; 7a - 4p)  Please contact [facility] for night-coverage after hours (4p -7a ) and weekends on [email or web address]  Patient seen with  NP, agree with the above note.   She is doing well, volume status looks ok today.   - Increase Coreg today to 6.25 mg bid and continue other meds.   I have discussed ICD with Dr. Ladona Ridgel.  She will have Lifevest at discharge. Repeat echo in 2 months (this will be 3 months post-MI), and if EF remains low (which I suspect it will), will arrange for ICD.   She is on heparin bridge to therapeutic INR on warfarin for LV thrombus.  We discussed Lovenox for home. She says that she cannot give herself the shot and has no one at home to give it to her. She will have to stay until INR 2, hopefully over weekend.   We will arrange CHF clinic and coumadin clinic followup.   Marca Ancona 09/01/2018 9:29 AM

## 2018-09-01 NOTE — Progress Notes (Signed)
ANTICOAGULATION CONSULT NOTE  Pharmacy Consult for IV heparin + warfarin Indication: LV thrombus  Allergies  Allergen Reactions  . Other Shortness Of Breath    Environmental allergies  . Gluten Meal     Gluten makes her wheeze    Patient Measurements: Height: 5\' 1"  (154.9 cm) Weight: 194 lb 3.2 oz (88.1 kg)(scale a) IBW/kg (Calculated) : 47.8 Heparin Dosing Weight: 70 kg  Vital Signs: Temp: 98.4 F (36.9 C) (02/28 0550) Temp Source: Oral (02/28 0550) BP: 104/72 (02/28 0550) Pulse Rate: 86 (02/28 0550)  Labs: Recent Labs    08/30/18 0530 08/30/18 0923 08/31/18 0411 08/31/18 0916 09/01/18 0527  HGB 12.8 11.6*  11.6* 12.0  --  12.5  HCT 39.3 34.0*  34.0* 36.8  --  38.1  PLT 502*  --  463*  --  431*  LABPROT  --   --   --  14.9 16.5*  INR  --   --   --  1.2 1.4*  HEPARINUNFRC 0.45  --   --  0.58 0.80*  CREATININE 0.87  --  0.82  --  0.84    Estimated Creatinine Clearance: 99.7 mL/min (by C-G formula based on SCr of 0.84 mg/dL).   Medical History: Past Medical History:  Diagnosis Date  . Abnormal vaginal Pap smear   . Anxiety   . Asthma    HX - rarely uses inhaler - seasonal  . Bipolar 1 disorder (HCC)   . Depression   . History of depression   . History of trichomoniasis   . Hypertension   . Pneumonia    CHILDHOOD    Medications:  Medications Prior to Admission  Medication Sig Dispense Refill Last Dose  . albuterol (PROVENTIL HFA;VENTOLIN HFA) 108 (90 Base) MCG/ACT inhaler Inhale 2 puffs into the lungs every 6 (six) hours as needed for wheezing or shortness of breath.   08/27/2018 at Unknown time  . amoxicillin-clavulanate (AUGMENTIN) 875-125 MG tablet Take 1 tablet by mouth every 12 (twelve) hours. 14 tablet 0 08/27/2018 at Unknown time  . benzonatate (TESSALON) 100 MG capsule Take 2 capsules (200 mg total) by mouth 2 (two) times daily as needed for cough. 20 capsule 0 08/27/2018 at Unknown time  . buPROPion (WELLBUTRIN) 100 MG tablet Take 100 mg by  mouth 2 (two) times daily.    08/27/2018 at Unknown time  . citalopram (CELEXA) 20 MG tablet Take 20 mg by mouth daily.   08/27/2018 at Unknown time  . ibuprofen (ADVIL,MOTRIN) 200 MG tablet Take 200 mg by mouth daily as needed for headache.    08/27/2018 at Unknown time  . loratadine (CLARITIN) 10 MG tablet Take 10 mg by mouth daily as needed for allergies.   08/27/2018 at Unknown time   Scheduled:  . aspirin EC  81 mg Oral Daily  . atorvastatin  80 mg Oral q1800  . carvedilol  6.25 mg Oral BID WC  . coumadin book   Does not apply Once  . feeding supplement (ENSURE ENLIVE)  237 mL Oral BID BM  . sacubitril-valsartan  1 tablet Oral BID  . sodium chloride flush  3 mL Intravenous Q12H  . sodium chloride flush  3 mL Intravenous Q12H  . spironolactone  25 mg Oral Daily  . warfarin  10 mg Oral ONCE-1800  . Warfarin - Pharmacist Dosing Inpatient   Does not apply q1800   Assessment: 57 yoF with Hx anxiety, asthma, bipolar, depression, obesity, presenting with progressive SOB x 1 month. D-dimer elevated but  CTA negative for PE. 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 Pharmacy to dose heparin IV.  Heparin level slightly elevated  at 0.8, on 1500 units/hr. Hgb and pltc stable   No s/sx of bleeding. No infusion issues.  INR 1.4 starting to trend up with warfarin   Underwent R/L cath on 2/26 showing subtotal occlusion of proxLAD (possibly SCAD) and normal filling pressures with preserved cardiac output.  Goal of Therapy: Heparin level 0.3-0.7 units/ml INR goal: 2-3 Monitor platelets by anticoagulation protocol: Yes  Plan: Decrease  heparin infusion at 1400 units/hr  warfarin 10mg  tonight - repeat  Daily CBC, heparin level and INR daily Monitor for signs of bleeding or thrombosis  Leota Sauers Pharm.D. CPP, BCPS Clinical Pharmacist [phone] 09/01/2018 9:05 AM

## 2018-09-02 LAB — PROTIME-INR
INR: 1.8 — ABNORMAL HIGH (ref 0.8–1.2)
Prothrombin Time: 20.7 s — ABNORMAL HIGH (ref 11.4–15.2)

## 2018-09-02 LAB — HEPARIN LEVEL (UNFRACTIONATED): Heparin Unfractionated: 0.65 IU/mL (ref 0.30–0.70)

## 2018-09-02 LAB — BASIC METABOLIC PANEL WITH GFR
Anion gap: 7 (ref 5–15)
BUN: 10 mg/dL (ref 6–20)
CO2: 23 mmol/L (ref 22–32)
Calcium: 8.7 mg/dL — ABNORMAL LOW (ref 8.9–10.3)
Chloride: 108 mmol/L (ref 98–111)
Creatinine, Ser: 0.67 mg/dL (ref 0.44–1.00)
GFR calc Af Amer: >60 mL/min (ref >60)
GFR calc non Af Amer: >60 mL/min (ref >60)
Glucose, Bld: 99 mg/dL (ref 70–99)
Potassium: 3.6 mmol/L (ref 3.5–5.1)
Sodium: 138 mmol/L (ref 135–145)

## 2018-09-02 LAB — CBC
HCT: 37.3 % (ref 36.0–46.0)
Hemoglobin: 12.3 g/dL (ref 12.0–15.0)
MCH: 28.7 pg (ref 26.0–34.0)
MCHC: 33 g/dL (ref 30.0–36.0)
MCV: 86.9 fL (ref 80.0–100.0)
Platelets: 408 10*3/uL — ABNORMAL HIGH (ref 150–400)
RBC: 4.29 MIL/uL (ref 3.87–5.11)
RDW: 12.9 % (ref 11.5–15.5)
WBC: 5.2 10*3/uL (ref 4.0–10.5)
nRBC: 0 % (ref 0.0–0.2)

## 2018-09-02 MED ORDER — WARFARIN SODIUM 5 MG PO TABS
5.0000 mg | ORAL_TABLET | Freq: Once | ORAL | Status: AC
  Administered 2018-09-02: 5 mg via ORAL
  Filled 2018-09-02: qty 1

## 2018-09-02 NOTE — Progress Notes (Signed)
PHASE I Cardiac Rehab   1155am Bedside visit to assess ambulation and education needs. Pt requests to not be interrupted while talking on telephone.  She states she is ambulating on her own.  Deveron Furlong, RN, BSN Cardiac Pulmonary Rehab

## 2018-09-02 NOTE — Care Management (Signed)
Patient provided with 30 day Entresto Card and patient assistance forms. Patient will follow up with CHF clinic and unit secretary informed she will need apt at [facility] made. Will continue to follow for MATCH needs.

## 2018-09-02 NOTE — Progress Notes (Signed)
Progress Note  Patient Name: Sheila Estes Date of Encounter: 09/02/2018  Primary Cardiologist: Dr Shirlee Latch  Subjective   No CP or dyspnea  Inpatient Medications    Scheduled Meds: . aspirin EC  81 mg Oral Daily  . atorvastatin  80 mg Oral q1800  . carvedilol  6.25 mg Oral BID WC  . coumadin book   Does not apply Once  . sacubitril-valsartan  1 tablet Oral BID  . sodium chloride flush  3 mL Intravenous Q12H  . sodium chloride flush  3 mL Intravenous Q12H  . spironolactone  25 mg Oral Daily  . warfarin  5 mg Oral ONCE-1800  . Warfarin - Pharmacist Dosing Inpatient   Does not apply q1800   Continuous Infusions: . sodium chloride    . sodium chloride    . heparin 1,400 Units/hr (09/02/18 0718)   PRN Meds: sodium chloride, sodium chloride, acetaminophen, albuterol, ALPRAZolam, benzonatate, loratadine, ondansetron (ZOFRAN) IV, sodium chloride flush, sodium chloride flush   Vital Signs    Vitals:   09/01/18 1106 09/01/18 1931 09/01/18 2250 09/02/18 0512  BP: 109/71 99/62 (!) 96/55 (!) 100/55  Pulse: 91 86 83 69  Resp: 18 16  18   Temp: 98.2 F (36.8 C) 98.2 F (36.8 C)  98.7 F (37.1 C)  TempSrc: Oral Oral  Oral  SpO2: 97% 98% 94% 96%  Weight:    88.7 kg  Height:        Intake/Output Summary (Last 24 hours) at 09/02/2018 0830 Last data filed at 09/01/2018 2250 Gross per 24 hour  Intake 483 ml  Output -  Net 483 ml   Last 3 Weights 09/02/2018 09/01/2018 08/31/2018  Weight (lbs) 195 lb 8 oz 194 lb 3.2 oz 194 lb  Weight (kg) 88.678 kg 88.089 kg 87.998 kg      Telemetry    Sinus- Personally Reviewed   Physical Exam   GEN: No acute distress.   Neck: No JVD Cardiac: RRR, no murmurs, rubs, or gallops.  Respiratory: Clear to auscultation bilaterally. GI: Soft, nontender, non-distended  MS: No edema Neuro:  Nonfocal  Psych: Normal affect   Labs    Chemistry Recent Labs  Lab 08/28/18 1031  08/31/18 0411 09/01/18 0527 09/02/18 0504  NA  --    < > 140  140 138  K  --    < > 3.4* 3.8 3.6  CL  --    < > 105 107 108  CO2  --    < > 20* 24 23  GLUCOSE  --    < > 83 96 99  BUN  --    < > 16 11 10   CREATININE  --    < > 0.82 0.84 0.67  CALCIUM  --    < > 9.0 9.1 8.7*  PROT 7.2  --   --   --   --   ALBUMIN 3.1*  --   --   --   --   AST 15  --   --   --   --   ALT 16  --   --   --   --   ALKPHOS 33*  --   --   --   --   BILITOT 0.7  --   --   --   --   GFRNONAA  --    < > >60 >60 >60  GFRAA  --    < > >60 >60 >60  ANIONGAP  --    < >  15 9 7    < > = values in this interval not displayed.     Hematology Recent Labs  Lab 08/31/18 0411 09/01/18 0527 09/02/18 0504  WBC 5.6 5.2 5.2  RBC 4.15 4.37 4.29  HGB 12.0 12.5 12.3  HCT 36.8 38.1 37.3  MCV 88.7 87.2 86.9  MCH 28.9 28.6 28.7  MCHC 32.6 32.8 33.0  RDW 12.7 12.7 12.9  PLT 463* 431* 408*    Cardiac Enzymes Recent Labs  Lab 08/28/18 1837 08/29/18 0009  TROPONINI 0.33* 0.28*    Recent Labs  Lab 08/28/18 1041  TROPIPOC 0.30*     BNP Recent Labs  Lab 08/28/18 1031  BNP 639.6*     DDimer  Recent Labs  Lab 08/28/18 0727  DDIMER 6.52*     Patient Profile     28 y.o. female admitted with new onset systolic congestive heart failure.  Patient had chest pain 1 month prior to admission felt likely time of myocardial infarction.  Echocardiogram showed ejection fraction 25 to 30%, mild left atrial enlargement, small pericardial effusion and large mural apical thrombus.  Cardiac catheterization revealed subtotal occlusion of proximal LAD and SCAD suspected.  No other obstructive coronary disease and normal filling pressures.  Cardiac MRI showed ejection fraction 27% with LAD territory infarct unlikely to be viable.  LV apical thrombus noted.  Assessment & Plan    1 SCAD-of LAD.  No plans for revascularization as MRI not suggestive of viability.  Plan medical therapy.  Continue aspirin and statin.  2 acute systolic congestive heart failure-she is euvolemic on examination  today.  Continue present dose of diuretic.  3 ischemic cardiomyopathy-plan to continue Entresto, spironolactone and carvedilol.  Titrate medications following discharge.  Systolic blood pressure presently in the 90s.  Plan will be LifeVest at discharge.  Repeat echocardiogram 2 months after discharge and if ejection fraction less than 35% would need ICD.  4 LV apical thrombus-plan to continue IV heparin until INR 2 or greater.  Possible discharge tomorrow if INR therapeutic.  Will need close follow-up in CHF clinic in Coumadin clinic.  For questions or updates, please contact [facility] Please consult [email or web address] for contact info under        Signed, Olga Millers, MD  09/02/2018, 8:30 AM

## 2018-09-02 NOTE — Progress Notes (Signed)
ANTICOAGULATION CONSULT NOTE  Pharmacy Consult for IV heparin + warfarin Indication: LV thrombus  Allergies  Allergen Reactions  . Other Shortness Of Breath    Environmental allergies  . Gluten Meal     Gluten makes her wheeze    Patient Measurements: Height: 5\' 1"  (154.9 cm) Weight: 195 lb 8 oz (88.7 kg) IBW/kg (Calculated) : 47.8 Heparin Dosing Weight: 70 kg  Vital Signs: Temp: 98.7 F (37.1 C) (02/29 0512) Temp Source: Oral (02/29 0512) BP: 100/55 (02/29 0512) Pulse Rate: 69 (02/29 0512)  Labs: Recent Labs    08/31/18 0411 08/31/18 0916 09/01/18 0527 09/02/18 0504  HGB 12.0  --  12.5 12.3  HCT 36.8  --  38.1 37.3  PLT 463*  --  431* 408*  LABPROT  --  14.9 16.5* 20.7*  INR  --  1.2 1.4* 1.8*  HEPARINUNFRC  --  0.58 0.80* 0.65  CREATININE 0.82  --  0.84 0.67    Estimated Creatinine Clearance: 105.2 mL/min (by C-G formula based on SCr of 0.67 mg/dL).   Medical History: Past Medical History:  Diagnosis Date  . Abnormal vaginal Pap smear   . Anxiety   . Asthma    HX - rarely uses inhaler - seasonal  . Bipolar 1 disorder (HCC)   . Depression   . History of depression   . History of trichomoniasis   . Hypertension   . Pneumonia    CHILDHOOD    Medications:  Medications Prior to Admission  Medication Sig Dispense Refill Last Dose  . albuterol (PROVENTIL HFA;VENTOLIN HFA) 108 (90 Base) MCG/ACT inhaler Inhale 2 puffs into the lungs every 6 (six) hours as needed for wheezing or shortness of breath.   08/27/2018 at Unknown time  . amoxicillin-clavulanate (AUGMENTIN) 875-125 MG tablet Take 1 tablet by mouth every 12 (twelve) hours. 14 tablet 0 08/27/2018 at Unknown time  . benzonatate (TESSALON) 100 MG capsule Take 2 capsules (200 mg total) by mouth 2 (two) times daily as needed for cough. 20 capsule 0 08/27/2018 at Unknown time  . buPROPion (WELLBUTRIN) 100 MG tablet Take 100 mg by mouth 2 (two) times daily.    08/27/2018 at Unknown time  . citalopram (CELEXA)  20 MG tablet Take 20 mg by mouth daily.   08/27/2018 at Unknown time  . ibuprofen (ADVIL,MOTRIN) 200 MG tablet Take 200 mg by mouth daily as needed for headache.    08/27/2018 at Unknown time  . loratadine (CLARITIN) 10 MG tablet Take 10 mg by mouth daily as needed for allergies.   08/27/2018 at Unknown time   Scheduled:  . aspirin EC  81 mg Oral Daily  . atorvastatin  80 mg Oral q1800  . carvedilol  6.25 mg Oral BID WC  . coumadin book   Does not apply Once  . sacubitril-valsartan  1 tablet Oral BID  . sodium chloride flush  3 mL Intravenous Q12H  . sodium chloride flush  3 mL Intravenous Q12H  . spironolactone  25 mg Oral Daily  . Warfarin - Pharmacist Dosing Inpatient   Does not apply q1800   Assessment: 45 yoF with Hx anxiety, asthma, bipolar, depression, obesity, presenting with progressive SOB x 1 month. D-dimer elevated but CTA negative for PE. 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 Pharmacy to dose heparin IV and warfarin.  Heparin level therapeutic at 0.65, on 1400 units/hr. Hgb and pltc stable. No s/sx of bleeding and no infusion issues, confirmed with nursing.  INR 1.8, trending up  from 1.4 yesterday on warfarin   Underwent R/L cath on 2/26 showing subtotal occlusion of proxLAD (possibly SCAD) and normal filling pressures with preserved cardiac output.  Goal of Therapy: Heparin level 0.3-0.7 units/ml INR goal: 2-3 Monitor platelets by anticoagulation protocol: Yes  Plan: Continue heparin infusion at 1400 units/hr Warfarin 5mg  tonight  Daily CBC, heparin level and INR  Monitor for signs of bleeding or thrombosis  Foye Deer D PGY1 Pharmacy Resident  Phone [phone] Please use AMION for clinical pharmacists numbers  09/02/2018      7:49 AM

## 2018-09-03 ENCOUNTER — Encounter (HOSPITAL_COMMUNITY): Payer: Self-pay | Admitting: Cardiology

## 2018-09-03 DIAGNOSIS — Z5181 Encounter for therapeutic drug level monitoring: Secondary | ICD-10-CM

## 2018-09-03 DIAGNOSIS — Z7901 Long term (current) use of anticoagulants: Secondary | ICD-10-CM

## 2018-09-03 DIAGNOSIS — I255 Ischemic cardiomyopathy: Secondary | ICD-10-CM | POA: Diagnosis present

## 2018-09-03 DIAGNOSIS — I236 Thrombosis of atrium, auricular appendage, and ventricle as current complications following acute myocardial infarction: Secondary | ICD-10-CM

## 2018-09-03 DIAGNOSIS — I2542 Coronary artery dissection: Secondary | ICD-10-CM

## 2018-09-03 HISTORY — DX: Encounter for therapeutic drug level monitoring: Z51.81

## 2018-09-03 HISTORY — DX: Coronary artery dissection: I25.42

## 2018-09-03 HISTORY — DX: Thrombosis of atrium, auricular appendage, and ventricle as current complications following acute myocardial infarction: I23.6

## 2018-09-03 LAB — BASIC METABOLIC PANEL
Anion gap: 9 (ref 5–15)
BUN: 11 mg/dL (ref 6–20)
CO2: 23 mmol/L (ref 22–32)
Calcium: 8.8 mg/dL — ABNORMAL LOW (ref 8.9–10.3)
Chloride: 107 mmol/L (ref 98–111)
Creatinine, Ser: 0.96 mg/dL (ref 0.44–1.00)
GFR calc Af Amer: >60 mL/min (ref >60)
GFR calc non Af Amer: >60 mL/min (ref >60)
Glucose, Bld: 108 mg/dL — ABNORMAL HIGH (ref 70–99)
POTASSIUM: 3.6 mmol/L (ref 3.5–5.1)
Sodium: 139 mmol/L (ref 135–145)

## 2018-09-03 LAB — HEPARIN LEVEL (UNFRACTIONATED): Heparin Unfractionated: 0.77 IU/mL — ABNORMAL HIGH (ref 0.30–0.70)

## 2018-09-03 LAB — PROTIME-INR
INR: 2 — ABNORMAL HIGH (ref 0.8–1.2)
Prothrombin Time: 22.2 seconds — ABNORMAL HIGH (ref 11.4–15.2)

## 2018-09-03 LAB — CBC
HCT: 35.5 % — ABNORMAL LOW (ref 36.0–46.0)
Hemoglobin: 11.7 g/dL — ABNORMAL LOW (ref 12.0–15.0)
MCH: 28.6 pg (ref 26.0–34.0)
MCHC: 33 g/dL (ref 30.0–36.0)
MCV: 86.8 fL (ref 80.0–100.0)
Platelets: 374 10*3/uL (ref 150–400)
RBC: 4.09 MIL/uL (ref 3.87–5.11)
RDW: 12.7 % (ref 11.5–15.5)
WBC: 5 10*3/uL (ref 4.0–10.5)
nRBC: 0 % (ref 0.0–0.2)

## 2018-09-03 MED ORDER — SACUBITRIL-VALSARTAN 24-26 MG PO TABS: 1.0000 | ORAL_TABLET | Freq: Two times a day (BID) | ORAL | 6 refills | Status: DC

## 2018-09-03 MED ORDER — ASPIRIN 81 MG PO TBEC: 81.0000 mg | DELAYED_RELEASE_TABLET | Freq: Every day | ORAL | Status: DC

## 2018-09-03 MED ORDER — WARFARIN SODIUM 5 MG PO TABS
5.0000 mg | ORAL_TABLET | Freq: Every day | ORAL | Status: DC
  Administered 2018-09-03: 5 mg via ORAL
  Filled 2018-09-03: qty 1

## 2018-09-03 MED ORDER — CARVEDILOL 3.125 MG PO TABS
3.1250 mg | ORAL_TABLET | Freq: Two times a day (BID) | ORAL | Status: DC
  Administered 2018-09-03 – 2018-09-04 (×2): 3.125 mg via ORAL
  Filled 2018-09-03 (×2): qty 1

## 2018-09-03 MED ORDER — CARVEDILOL 6.25 MG PO TABS: 6.2500 mg | ORAL_TABLET | Freq: Two times a day (BID) | ORAL | 6 refills | Status: DC

## 2018-09-03 MED ORDER — ATORVASTATIN CALCIUM 80 MG PO TABS: 80.0000 mg | ORAL_TABLET | Freq: Every day | ORAL | 6 refills | Status: DC

## 2018-09-03 MED ORDER — ACETAMINOPHEN 325 MG PO TABS: 650.0000 mg | ORAL_TABLET | ORAL | Status: DC | PRN

## 2018-09-03 MED ORDER — WARFARIN SODIUM 5 MG PO TABS: 5.0000 mg | ORAL_TABLET | Freq: Every day | ORAL | 6 refills | Status: DC

## 2018-09-03 MED ORDER — ALPRAZOLAM 0.25 MG PO TABS: 0.2500 mg | ORAL_TABLET | Freq: Two times a day (BID) | ORAL | 0 refills | Status: DC | PRN

## 2018-09-03 MED ORDER — SPIRONOLACTONE 25 MG PO TABS: 25.0000 mg | ORAL_TABLET | Freq: Every day | ORAL | 6 refills | Status: DC

## 2018-09-03 NOTE — Care Management (Addendum)
Patient provided with [facility] letter. Confirmed w patient that she has LifeVest to go home with.

## 2018-09-03 NOTE — Discharge Summary (Addendum)
Discharge Summary    Patient ID: Sheila Estes MRN: [id number]; DOB: 10-Jun-1991  Admit date: 08/28/2018 Discharge date: 09/03/2018  Primary Care Provider: System, Pcp Not In  Primary Cardiologist: Marca Ancona, MD and AHF  Primary Electrophysiologist:  None   Discharge Diagnoses    Principal Problem:   Acute systolic HF (heart failure) (HCC) Active Problems:   Spontaneous dissection of coronary artery- LAD   Ischemic cardiomyopathy   Left ventricular apical thrombus following MI [facility])   Anticoagulation goal of INR 2 to 3  Allergies Allergies  Allergen Reactions  . Other Shortness Of Breath    Environmental allergies  . Gluten Meal     Gluten makes her wheeze    Diagnostic Studies/Procedures    Cardiac MRI:  1. The LV was mildly dilated with EF 27%. Wall motion abnormalities as noted above, in a LAD-territory infarction pattern. Delayed enhancement images suggest a dense LAD-territory infarct, the myocardium is unlikely to be viable. There is an LV apical thrombus. 2. Mildly dilated RV with apical akinesis and overall EF 31%.  RHC/LHC: Coronary Findings  Diagnostic  Dominance: Right  Left Main  No left main, separate ostia for LAD and LCx.  Left Anterior Descending  The LAD is subtotally occluded proximally after a septal perforator. There is TIMI 2 flow in the mid to distal LAD. The appearance of the LAD suggests SCAD.  Left Circumflex  No significant disease.  Right Coronary Artery  No significant disease.  Intervention   No interventions have been documented.  Right Heart   Right Heart Pressures RHC Procedural Findings: Hemodynamics (mmHg) RA mean 1 RV 35/3 PA 36/8, mean 23 PCWP mean 12 LV 109/19 AO 107/79  Oxygen saturations: PA 67% AO 98%  Cardiac Output (Fick) 4.62  Cardiac Index (Fick) 2.46   Echo 08/28/18 IMPRESSIONS   1. The left ventricle has severely reduced systolic function, with an ejection fraction of 25-30%. The cavity  size was normal. Left ventricular diastology could not be evaluated.  2. The right ventricle has normal systolic function. The cavity was normal. There is no increase in right ventricular wall thickness.  3. Left atrial size was mildly dilated.  4. Small pericardial effusion.  5. The mitral valve is normal in structure.  6. The tricuspid valve is normal in structure.  7. The aortic valve is tricuspid.  8. The pulmonic valve was not well visualized. Pulmonic valve regurgitation is mild to moderate is mild by color flow Doppler.  9. LVEF is severely depressed at approximately 25 to 30% with akinesis of the distal 2/3 of LV THere is a large mural thombus at apex.  FINDINGS  Left Ventricle: The left ventricle has severely reduced systolic function, with an ejection fraction of 25-30%. The cavity size was normal. There is no increase in left ventricular wall thickness. Left ventricular diastology could not be evaluated  Definity contrast agent was given IV to delineate the left ventricular endocardial borders. Right Ventricle: The right ventricle has normal systolic function. The cavity was normal. There is no increase in right ventricular wall thickness. Left Atrium: left atrial size was mildly dilated Right Atrium: right atrial size was normal in size. Right atrial pressure is estimated at 3 mmHg. Interatrial Septum: No atrial level shunt detected by color flow Doppler. Pericardium: A small pericardial effusion is present. Mitral Valve: The mitral valve is normal in structure. Mitral valve regurgitation is mild by color flow Doppler. Tricuspid Valve: The tricuspid valve is normal in structure. Tricuspid valve regurgitation  is trivial by color flow Doppler. Aortic Valve: The aortic valve is tricuspid Aortic valve regurgitation was not visualized by color flow Doppler. Pulmonic Valve: The pulmonic valve was not well visualized. Pulmonic valve regurgitation is mild to moderate is mild by color flow  Doppler. Venous: The inferior vena cava is normal in size with greater than 50% respiratory variability. Additional Comments: LVEF is severely depressed at approximately 25 to 30% with akinesis of the distal 2/3 of LV THere is a large mural thombus at apex.    _____________   History of Present Illness     1 yoF with hx of obesity, anxiety, asthma, bipolar disorder and depression presented [facility] ER at [facility] with SOB 08/28/18.   She originally presented a week prior to this admit and Dx of PNA and inhalers prescribed.  She did not improve and presented 08/28/18 with increased SOB and cough that awakened her from sleep.  She would have to sit up to catch her breath.  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    No FH of cardiomyopathy.    On admit K+ 3.8, Cr 1.76 BNP 639 troponin 0.3.  ddimer 6.5 Her ECG was also abnormal, sinus but with inferior Qs and anterior Qs.  CXR with progression of diffuse bilateral airspace disease that is symmetric suggestive of acute pulmonary edema.    With elevated DDimer she had CTA of chest that showed symmetric diffuse central ground-glass and consolidative peribronchovascular densities in both lungs may reflect cardiogenic pulmonary edema given mild cardiomegaly, but possibly also fat emboli syndrome, toxic inhalation, acute hypersensitivity pneumonitis, or drug reaction. Diffuse alveolar hemorrhage could have a similar appearance. No evidence of pulmonary embolism.     Echo was done with LV thrombus and EF 25-30%.  IV heparin was started and she was transferred/admitted to [facility] for further testing and management.    Hospital Course     Consultants: advanced Heart Failure   Dr. Shirlee Latch saw on the 25th and assumed care.  Her echo also revealed peri-apical akinesis along with the LV thrombus.  He felt the process has been present for at least a month.  Concern for takotsubo as well.  Further eval with cardiac MRI and rt and lt cardiac cath.    She was diuresed with lasix.   Spironolactone and initially losartan the to entresto planned.  Her troponin at 0.33 most likely due to HF.      Cardiac MRI with EF 27% and mildly dilated LV.  EF RV of 31% Cardiac cath with normal filling pressures and preserved cardiac output. 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   LAD territory is unlikely to be viable based on MRI, so would not pursue CTO procedure or CABG--possible MI about a month ago   Plan for lifevest. And possible ICD if no improvement of EF.  Heparin to coumadin crossover.   Cardiac rehab ambulated pt.  She continued to improve and meds adjusted.  Waiting for therapeutic INR as well.   meds at discharge - Continue spironolactone 25 mg daily.  - Continue Entresto 24/26 mg BID. SBP 100s.  - Increase Coreg 6.25 mg bid   - Eventually, would like her on Bidil. SBP 97/65 today.  Today she has been seen and found stable for discharge by Dr. Jens Som. No chest pain or SOB.  She has lifevest in place.  She is + 154 since admit and wt is up to 90.7 kg  Lowest wt of  88 kg.  Will place on coumadin 5 mg daily and she will have checked 09/06/18.   INR today 2.0.   She will return to work, she sits at desk no strenuous activity.   _____________  Discharge Vitals Blood pressure 97/65, pulse 85, temperature 97.8 F (36.6 C), resp. rate 16, height  (1.549 m), weight 90.7 kg, last menstrual period 08/25/2018, SpO2 100 %.  Filed Weights   09/01/18 0550 09/02/18 0512 09/03/18 0606  Weight: 88.1 kg 88.7 kg 90.7 kg    Labs & Radiologic Studies    CBC Recent Labs    09/02/18 0504 09/03/18 0422  WBC 5.2 5.0  HGB 12.3 11.7*  HCT 37.3 35.5*  MCV 86.9 86.8  PLT 408* 374   Basic Metabolic Panel Recent Labs    40/98/11 0527 09/02/18 0504 09/03/18 0422  NA 140 138 139  K 3.8 3.6 3.6  CL 107 108 107  CO2 GLUCOSE 96 99 108*  BUN CREATININE 0.84 0.67 0.96  CALCIUM 9.1 8.7* 8.8*  MG 2.2  --   --    Liver  Function Tests No results for input(s): AST, ALT, ALKPHOS, BILITOT, PROT, ALBUMIN in the last 72 hours. No results for input(s): LIPASE, AMYLASE in the last 72 hours. Cardiac Enzymes No results for input(s): CKTOTAL, CKMB, CKMBINDEX, TROPONINI in the last 72 hours. BNP Invalid input(s): POCBNP D-Dimer No results for input(s): DDIMER in the last 72 hours. Hemoglobin A1C No results for input(s): HGBA1C in the last 72 hours. Fasting Lipid Panel No results for input(s): CHOL, HDL, LDLCALC, TRIG, CHOLHDL, LDLDIRECT in the last 72 hours. Thyroid Function Tests No results for input(s): TSH, T4TOTAL, T3FREE, THYROIDAB in the last 72 hours.  Invalid input(s): FREET3 _____________  Dg Chest 2 View  Result Date: 08/28/2018 CLINICAL DATA:  Short of breath EXAM: CHEST - 2 VIEW COMPARISON:  08/20/2018 FINDINGS: Significant progression of diffuse bilateral airspace disease which is symmetric and spares the periphery. Mild cardiac enlargement. No pleural effusion. No collapse. No acute skeletal abnormality. IMPRESSION: Significant progression of symmetric bilateral airspace disease. Cardiac enlargement. This may represent cardiogenic or noncardiogenic edema. Infection also possible. Electronically Signed   By: Marlan Palau M.D.   On: 08/28/2018 08:03   Dg Chest 2 View  Result Date: 08/20/2018 CLINICAL DATA:  Chest pressure and shortness of breath EXAM: CHEST - 2 VIEW COMPARISON:  April 23, 2008 FINDINGS: There is patchy airspace opacity in both upper lobes and right lower lobe regions. Heart is upper normal in size with pulmonary vascularity within normal limits. No adenopathy. No bone lesions. IMPRESSION: Patchy airspace opacity bilaterally, likely representing multifocal pneumonia. A degree of noncardiogenic edema is a differential consideration. No adenopathy evident. Heart upper normal in size. Electronically Signed   By: Bretta Bang III M.D.   On: 08/20/2018 04:34   Ct Angio Chest Pe W/cm  &/or Wo Cm  Result Date: 08/28/2018 CLINICAL DATA:  Severe shortness of breath. EXAM: CT ANGIOGRAPHY CHEST WITH CONTRAST TECHNIQUE: Multidetector CT imaging of the chest was performed using the standard protocol during bolus administration of intravenous contrast. Multiplanar CT image reconstructions and MIPs were obtained to evaluate the vascular anatomy. CONTRAST:  ISOVUE-370 IOPAMIDOL (ISOVUE-370) INJECTION 76% COMPARISON:  Chest x-ray from same day. FINDINGS: Cardiovascular: Satisfactory opacification of the pulmonary arteries to the segmental level. No evidence of pulmonary embolism. Mild cardiomegaly. Trace pericardial effusion. No thoracic aortic aneurysm or dissection. Mediastinum/Nodes: Prominent subcentimeter bilateral hilar  lymph nodes are likely reactive. No enlarged mediastinal or axillary lymph nodes. Thyroid gland, trachea, and esophagus demonstrate no significant findings. Lungs/Pleura: Relatively symmetric, diffuse, central ground-glass and consolidative peribronchovascular densities in both lungs. Scattered mild interlobular septal thickening. Diffuse moderate to severe peribronchial thickening. No pneumothorax. Upper Abdomen: No acute abnormality. Musculoskeletal: No chest wall abnormality. No acute or significant osseous findings. Review of the MIP images confirms the above findings. IMPRESSION: 1. Relatively symmetric diffuse central ground-glass and consolidative peribronchovascular densities in both lungs may reflect cardiogenic pulmonary edema given mild cardiomegaly, interlobular septal thickening, and small bilateral pleural effusions. However, the differential also includes non-cardiogenic capillary leak pulmonary edema such as from prolonged hypotension, fat emboli syndrome, toxic inhalation, acute hypersensitivity pneumonitis, or drug reaction. Diffuse alveolar hemorrhage could have a similar appearance. 2.  No evidence of pulmonary embolism. Electronically Signed   By: Obie Dredge M.D.   On: 08/28/2018 09:57   Mr Cardiac Morphology W Wo Contrast  Result Date: 08/29/2018 CLINICAL DATA:  Cardiomyopathy of uncertain etiology. EXAM: CARDIAC MRI TECHNIQUE: The patient was scanned on a 1.5 Tesla GE magnet. A dedicated cardiac coil was used. Functional imaging was done using Fiesta sequences. 2,3, and 4 chamber views were done to assess for RWMA's. Modified Simpson's rule using a short axis stack was used to calculate an ejection fraction on a dedicated work [employer] officer, [employer]. The patient received 8 cc of Gadavist. After 10 minutes inversion recovery sequences were used to assess for infiltration and scar tissue. FINDINGS: Trivial bilateral pleural effusions. Small circumferential pericardial effusion. Bilateral airspace disease better visualized on chest CT this admission. Mildly dilated left ventricle with normal wall thickness. LV EF 27%. Mid to apical anteroseptal, inferoseptal, and anterior akinesis, apical inferior and apical lateral akinesis, akinesis of the true apex. LV apical thrombus noted. Mildly dilated right ventricle with moderately decreased systolic function, EF 31%. The apex of the RV is akinetic. Mildly dilated left atrium. Normal right atrial size. Trileaflet aortic valve with no stenosis or regurgitation. No significant mitral regurgitation noted. On delayed enhancement images, the same segments that are akinetic have 76-99% wall thickness subendocardial late gadolinium enhancement (LGE): Mid to apical anteroseptal wall/inferoseptal wall/anterior wall, apical inferior wall/lateral wall, true apex. Measurements: LVEDV 193 mL LVSV 53 mL LVEF 27% RVEDV 110 mL RVSV 34 mL RVEF 31% IMPRESSION: 1. The LV was mildly dilated with EF 27%. Wall motion abnormalities as noted above, in a LAD-territory infarction pattern. Delayed enhancement images suggest a dense LAD-territory infarct, the myocardium is unlikely to be viable. There is an LV apical thrombus. 2.  Mildly  dilated RV with apical akinesis and overall EF 31%. Dalton Mclean Electronically Signed   By: Marca Ancona M.D.   On: 08/29/2018 17:57   Disposition   Pt is being discharged home today in good condition.  Follow-up Plans & Appointments  Heart Healthy Low salt diet Weigh daily and call the heart failure team for 3 lb wt gain in a day or 5 lbs in a week.   Call if any questions or any difficulty obtaining medications  See you primary MD about the Wellbutrin and Celexa we have stopped  Wear you life vest   You may return to work.    Follow-up Information    [facility] Follow up on 09/14/2018.   Specialty:  Cardiology Why:  Heart Failure F/U 09/14/18 @ 10am -Parking in ER lot (enter under blue awning to left of ER),  or underneath [facility] in the [place] on [address] (garage code:[id number], elevator to 1st floor).  -Take all am meds and bring all med bottles  Contact information: [address] [id number] [place] [address] [phone]       [facility] Follow up on 09/06/2018.   Specialty:  Cardiology Why:  09/06/18 at 10:30 am.  Coumadin clinic appointment  Contact information: [address] [place] [address] [phone]         Discharge Instructions    Amb Referral to HF Clinic   Complete by:  As directed       Discharge Medications   Allergies as of 09/03/2018      Reactions   Other Shortness Of Breath   Environmental allergies   Gluten Meal    Gluten makes her wheeze      Medication List    STOP taking these medications   amoxicillin-clavulanate 875-125 MG tablet Commonly known as:  AUGMENTIN   buPROPion 100 MG tablet Commonly known as:  WELLBUTRIN   citalopram 20 MG tablet Commonly known as:  CELEXA   ibuprofen 200 MG tablet Commonly known as:  ADVIL,MOTRIN     TAKE these medications   acetaminophen 325  MG tablet Commonly known as:  TYLENOL Take 2 tablets (650 mg total) by mouth every 4 (four) hours as needed for headache or mild pain.   albuterol 108 (90 Base) MCG/ACT inhaler Commonly known as:  PROVENTIL HFA;VENTOLIN HFA Inhale 2 puffs into the lungs every 6 (six) hours as needed for wheezing or shortness of breath.   ALPRAZolam 0.25 MG tablet Commonly known as:  XANAX Take 1 tablet (0.25 mg total) by mouth 2 (two) times daily as needed for anxiety.   aspirin 81 MG EC tablet Take 1 tablet (81 mg total) by mouth daily. Start taking on:  September 04, 2018   atorvastatin 80 MG tablet Commonly known as:  LIPITOR Take 1 tablet (80 mg total) by mouth daily at 6 PM.   benzonatate 100 MG capsule Commonly known as:  TESSALON Take 2 capsules (200 mg total) by mouth 2 (two) times daily as needed for cough.   carvedilol 6.25 MG tablet Commonly known as:  COREG Take 1 tablet (6.25 mg total) by mouth 2 (two) times daily with a meal.   loratadine 10 MG tablet Commonly known as:  CLARITIN Take 10 mg by mouth daily as needed for allergies.   sacubitril-valsartan 24-26 MG Commonly known as:  ENTRESTO Take 1 tablet by mouth 2 (two) times daily.   spironolactone 25 MG tablet Commonly known as:  ALDACTONE Take 1 tablet (25 mg total) by mouth daily. Start taking on:  September 04, 2018   warfarin 5 MG tablet Commonly known as:  COUMADIN Take 1 tablet (5 mg total) by mouth daily at 6 PM.            Durable Medical Equipment  (From admission, onward)         Start     Ordered   08/31/18 1002  For home use only DME Vest life vest  Once     08/31/18 1001           Acute coronary syndrome (MI, NSTEMI, STEMI, etc) this admission?: No.    Outstanding Labs/Studies   Repeat echo in 2 months- this will be 3 months post MI BMET on visit.   Duration of Discharge Encounter   Greater than  30 minutes including physician time.  Signed, Nada Boozer, NP 09/03/2018, 10:45 AM

## 2018-09-03 NOTE — Progress Notes (Signed)
Progress Note  Patient Name: Sheila Estes Date of Encounter: 09/03/2018  Primary Cardiologist: Dr Shirlee Latch  Subjective   Pt denies CP or dyspnea  Inpatient Medications    Scheduled Meds: . aspirin EC  81 mg Oral Daily  . atorvastatin  80 mg Oral q1800  . carvedilol  6.25 mg Oral BID WC  . coumadin book   Does not apply Once  . sacubitril-valsartan  1 tablet Oral BID  . sodium chloride flush  3 mL Intravenous Q12H  . sodium chloride flush  3 mL Intravenous Q12H  . spironolactone  25 mg Oral Daily  . Warfarin - Pharmacist Dosing Inpatient   Does not apply q1800   Continuous Infusions: . sodium chloride    . sodium chloride    . heparin 1,400 Units/hr (09/02/18 0718)   PRN Meds: sodium chloride, sodium chloride, acetaminophen, albuterol, ALPRAZolam, benzonatate, loratadine, ondansetron (ZOFRAN) IV, sodium chloride flush, sodium chloride flush   Vital Signs    Vitals:   09/02/18 0902 09/02/18 1222 09/02/18 2109 09/03/18 0606  BP: 112/61 (!) 103/55 103/70 97/65  Pulse: 96 80 88 85  Resp:  18 18 16   Temp:  98.2 F (36.8 C) 98.4 F (36.9 C) 97.8 F (36.6 C)  TempSrc:  Oral Oral   SpO2:  100% 100% 100%  Weight:    90.7 kg  Height:        Intake/Output Summary (Last 24 hours) at 09/03/2018 0648 Last data filed at 09/03/2018 7001 Gross per 24 hour  Intake -  Output 500 ml  Net -500 ml   Last 3 Weights 09/03/2018 09/02/2018 09/01/2018  Weight (lbs) 199 lb 14.4 oz 195 lb 8 oz 194 lb 3.2 oz  Weight (kg) 90.674 kg 88.678 kg 88.089 kg      Telemetry    Sinus rhythm- Personally Reviewed   Physical Exam   GEN: No acute distress.  WD WN Neck: No JVD, supple Cardiac: RRR Respiratory: Clear to auscultation bilaterally; no wheeze. GI: Soft, NT/ND MS: No edema Neuro:  Grossly intact   Labs    Chemistry Recent Labs  Lab 08/28/18 1031  09/01/18 0527 09/02/18 0504 09/03/18 0422  NA  --    < > 140 138 139  K  --    < > 3.8 3.6 3.6  CL  --    < > 107 108 107    CO2  --    < > 24 23 23   GLUCOSE  --    < > 96 99 108*  BUN  --    < > 11 10 11   CREATININE  --    < > 0.84 0.67 0.96  CALCIUM  --    < > 9.1 8.7* 8.8*  PROT 7.2  --   --   --   --   ALBUMIN 3.1*  --   --   --   --   AST 15  --   --   --   --   ALT 16  --   --   --   --   ALKPHOS 33*  --   --   --   --   BILITOT 0.7  --   --   --   --   GFRNONAA  --    < > >60 >60 >60  GFRAA  --    < > >60 >60 >60  ANIONGAP  --    < > 9 7 9    < > =  values in this interval not displayed.     Hematology Recent Labs  Lab 09/01/18 0527 09/02/18 0504 09/03/18 0422  WBC 5.2 5.2 5.0  RBC 4.37 4.29 4.09  HGB 12.5 12.3 11.7*  HCT 38.1 37.3 35.5*  MCV 87.2 86.9 86.8  MCH 28.6 28.7 28.6  MCHC 32.8 33.0 33.0  RDW 12.7 12.9 12.7  PLT 431* 408* 374    Cardiac Enzymes Recent Labs  Lab 08/28/18 1837 08/29/18 0009  TROPONINI 0.33* 0.28*    Recent Labs  Lab 08/28/18 1041  TROPIPOC 0.30*     BNP Recent Labs  Lab 08/28/18 1031  BNP 639.6*     DDimer  Recent Labs  Lab 08/28/18 0727  DDIMER 6.52*     Patient Profile     28 y.o. female admitted with new onset systolic congestive heart failure.  Patient had chest pain 1 month prior to admission felt likely time of myocardial infarction.  Echocardiogram showed ejection fraction 25 to 30%, mild left atrial enlargement, small pericardial effusion and large mural apical thrombus.  Cardiac catheterization revealed subtotal occlusion of proximal LAD and SCAD suspected.  No other obstructive coronary disease and normal filling pressures.  Cardiac MRI showed ejection fraction 27% with LAD territory infarct unlikely to be viable.  LV apical thrombus noted.  Assessment & Plan    1 SCAD-of LAD.  As outlined in previous notes MRI did not suggest viability and therefore no revascularization is planned.  We will continue with aspirin and statin.    2 acute systolic congestive heart failure-patient remains euvolemic.  Continue present dose of  diuretic.  3 ischemic cardiomyopathy-continue Entresto, carvedilol and spironolactone.  Medications can be titrated following discharge though blood pressure is borderline and she may not tolerate higher doses.  Plan is to repeat echocardiogram in 2 months.  If ejection fraction less than 35% will require ICD.  LifeVest has been arranged and I discussed the importance of compliance with this today.    4 LV apical thrombus-INR is 2.  Discontinue IV heparin.  Discharge on Coumadin 5 mg daily.  Check INR in Coumadin clinic on Wednesday, March 4.  Discharge today as outlined above.  Check INR on Wednesday, March 4.  Transition of care appointment in CHF clinic in 1 week; bmet at that time.  Follow-up Dr. Shirlee Latch 4-6 weeks.  >30 min PA and physician time D2  For questions or updates, please contact [facility] Please consult [email or web address] for contact info under        Signed, Olga Millers, MD  09/03/2018, 6:48 AM

## 2018-09-03 NOTE — Progress Notes (Signed)
Pt up and about in room and BP in 80s systolic and she developed burning in her back along spine.  When I saw her symptoms resolved.  She was anxious.  But with low BP as well will decrease coreg to 3.125 BID and keep to monitor.  She was reassured this was just fine tuning her meds.  She is agreeable to stay.   Discussed with Dr. Jens Som.

## 2018-09-03 NOTE — Progress Notes (Signed)
   09/03/18 2021  Vitals  Temp 99 F (37.2 C)  Temp Source Oral  BP (!) 97/58  MAP (mmHg) 70  BP Location Right Arm  BP Method Automatic  Patient Position (if appropriate) Lying  Pulse Rate 79  Pulse Rate Source Monitor  Resp 18  Oxygen Therapy  SpO2 95 %  O2 Device Room Air  MEWS Score  MEWS RR 0  MEWS Pulse 0  MEWS Systolic 1  MEWS LOC 0  MEWS Temp 0  MEWS Score 1  MEWS Score Color Green  Pt's BP low, Dr. Ashley Royalty notified and ordered to hold entresto. Will monitor.

## 2018-09-03 NOTE — Progress Notes (Signed)
ANTICOAGULATION CONSULT NOTE  Pharmacy Consult for IV heparin + warfarin Indication: LV thrombus  Allergies  Allergen Reactions  . Other Shortness Of Breath    Environmental allergies  . Gluten Meal     Gluten makes her wheeze    Patient Measurements: Height: 5\' 1"  (154.9 cm) Weight: 199 lb 14.4 oz (90.7 kg) IBW/kg (Calculated) : 47.8 Heparin Dosing Weight: 70 kg  Vital Signs: Temp: 97.8 F (36.6 C) (03/01 0606) Temp Source: Oral (02/29 2109) BP: 97/65 (03/01 0606) Pulse Rate: 85 (03/01 0606)  Labs: Recent Labs    09/01/18 0527 09/02/18 0504 09/03/18 0422  HGB 12.5 12.3 11.7*  HCT 38.1 37.3 35.5*  PLT 431* 408* 374  LABPROT 16.5* 20.7* 22.2*  INR 1.4* 1.8* 2.0*  HEPARINUNFRC 0.80* 0.65 0.77*  CREATININE 0.84 0.67 0.96    Estimated Creatinine Clearance: 88.7 mL/min (by C-G formula based on SCr of 0.96 mg/dL).   Medical History: Past Medical History:  Diagnosis Date  . Abnormal vaginal Pap smear   . Anxiety   . Asthma    HX - rarely uses inhaler - seasonal  . Bipolar 1 disorder (HCC)   . Depression   . History of depression   . History of trichomoniasis   . Hypertension   . Pneumonia    CHILDHOOD    Medications:  Medications Prior to Admission  Medication Sig Dispense Refill Last Dose  . albuterol (PROVENTIL HFA;VENTOLIN HFA) 108 (90 Base) MCG/ACT inhaler Inhale 2 puffs into the lungs every 6 (six) hours as needed for wheezing or shortness of breath.   08/27/2018 at Unknown time  . amoxicillin-clavulanate (AUGMENTIN) 875-125 MG tablet Take 1 tablet by mouth every 12 (twelve) hours. 14 tablet 0 08/27/2018 at Unknown time  . benzonatate (TESSALON) 100 MG capsule Take 2 capsules (200 mg total) by mouth 2 (two) times daily as needed for cough. 20 capsule 0 08/27/2018 at Unknown time  . buPROPion (WELLBUTRIN) 100 MG tablet Take 100 mg by mouth 2 (two) times daily.    08/27/2018 at Unknown time  . citalopram (CELEXA) 20 MG tablet Take 20 mg by mouth daily.    08/27/2018 at Unknown time  . ibuprofen (ADVIL,MOTRIN) 200 MG tablet Take 200 mg by mouth daily as needed for headache.    08/27/2018 at Unknown time  . loratadine (CLARITIN) 10 MG tablet Take 10 mg by mouth daily as needed for allergies.   08/27/2018 at Unknown time   Scheduled:  . aspirin EC  81 mg Oral Daily  . atorvastatin  80 mg Oral q1800  . carvedilol  6.25 mg Oral BID WC  . coumadin book   Does not apply Once  . sacubitril-valsartan  1 tablet Oral BID  . sodium chloride flush  3 mL Intravenous Q12H  . sodium chloride flush  3 mL Intravenous Q12H  . spironolactone  25 mg Oral Daily  . Warfarin - Pharmacist Dosing Inpatient   Does not apply q1800   Assessment: 30 yoF with Hx anxiety, asthma, bipolar, depression, obesity, presenting with progressive SOB x 1 month. D-dimer elevated but CTA negative for PE. 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 Pharmacy to dose heparin IV and warfarin.  Underwent R/L cath on 2/26 showing subtotal occlusion of proxLAD (possibly SCAD) and normal filling pressures with preserved cardiac output.  Heparin level supratherapeutic at 0.77, on 1400 units/hr. Hgb and pltc stable. No s/sx of bleeding noted.  INR therapeutic at 2, trending up from 1.8 yesterday on warfarin  Goal of Therapy: Heparin level 0.3-0.7 units/ml INR goal: 2-3 Monitor platelets by anticoagulation protocol: Yes  Plan: Discontinue heparin infusion  Consider warfarin 7.5mg  tonight if patient not discharged Discharge on Warfarin 5mg  daily   Follow up in [facility] 09/06/18  Monitor for signs of bleeding or thrombosis  Foye Deer D  PGY1 Pharmacy Resident  Phone [phone] Please use AMION for clinical pharmacists numbers  09/03/2018      7:09 AM

## 2018-09-04 LAB — BASIC METABOLIC PANEL
Anion gap: 9 (ref 5–15)
BUN: 11 mg/dL (ref 6–20)
CO2: 22 mmol/L (ref 22–32)
Calcium: 8.6 mg/dL — ABNORMAL LOW (ref 8.9–10.3)
Chloride: 108 mmol/L (ref 98–111)
Creatinine, Ser: 0.79 mg/dL (ref 0.44–1.00)
GFR calc Af Amer: >60 mL/min (ref >60)
GFR calc non Af Amer: >60 mL/min (ref >60)
Glucose, Bld: 97 mg/dL (ref 70–99)
Potassium: 3.9 mmol/L (ref 3.5–5.1)
SODIUM: 139 mmol/L (ref 135–145)

## 2018-09-04 LAB — PROTIME-INR
INR: 1.8 — ABNORMAL HIGH (ref 0.8–1.2)
Prothrombin Time: 20.5 seconds — ABNORMAL HIGH (ref 11.4–15.2)

## 2018-09-04 MED ORDER — ENOXAPARIN SODIUM 100 MG/ML ~~LOC~~ SOLN
1.0000 mg/kg | SUBCUTANEOUS | Status: AC
  Administered 2018-09-04: 90 mg via SUBCUTANEOUS
  Filled 2018-09-04: qty 1

## 2018-09-04 MED ORDER — WARFARIN SODIUM 10 MG PO TABS
10.0000 mg | ORAL_TABLET | ORAL | Status: AC
  Administered 2018-09-04: 10 mg via ORAL
  Filled 2018-09-04: qty 1

## 2018-09-04 MED ORDER — CARVEDILOL 3.125 MG PO TABS: 3.1250 mg | ORAL_TABLET | Freq: Two times a day (BID) | ORAL | 5 refills | Status: DC

## 2018-09-04 MED ORDER — SPIRONOLACTONE 25 MG PO TABS: 25.0000 mg | ORAL_TABLET | Freq: Every day | ORAL | 5 refills | Status: DC

## 2018-09-04 MED ORDER — WARFARIN SODIUM 5 MG PO TABS: 7.5000 mg | ORAL_TABLET | Freq: Every day | ORAL | 5 refills | Status: DC

## 2018-09-04 MED ORDER — SACUBITRIL-VALSARTAN 24-26 MG PO TABS: 1.0000 | ORAL_TABLET | Freq: Two times a day (BID) | ORAL | 5 refills | Status: DC

## 2018-09-04 MED ORDER — SPIRONOLACTONE 25 MG PO TABS: 25.0000 mg | ORAL_TABLET | Freq: Every day | ORAL | Status: DC

## 2018-09-04 MED ORDER — ATORVASTATIN CALCIUM 80 MG PO TABS: 80.0000 mg | ORAL_TABLET | Freq: Every day | ORAL | 6 refills | Status: DC

## 2018-09-04 MED FILL — ATORVASTATIN CALCIUM 80 MG: 80 | 30 days supply | Qty: 30 | Fill #0 | Status: TO

## 2018-09-04 MED FILL — ENTRESTO 24 MG-26 MG TABLET: 24-26 | 30 days supply | Qty: 60 | Fill #0 | Status: TO

## 2018-09-04 MED FILL — SPIRONOLACTONE 25 MG TABLET: 25 | 30 days supply | Qty: 30 | Fill #0 | Status: TO

## 2018-09-04 MED FILL — CARVEDILOL 3.125 MG TABLET: 3.125 | 30 days supply | Qty: 60 | Fill #0 | Status: TO

## 2018-09-04 MED FILL — WARFARIN SODIUM 5 MG TABLET: 5 | 30 days supply | Qty: 45 | Fill #0 | Status: TO

## 2018-09-04 NOTE — Progress Notes (Signed)
[id number] Checked with pt to see if questions re ed done. Pt able to answer teach back re daily weights and watching sodium. Discussed how to resume walking for ex as pt wants to lose weight. Encouraged her to start walking slowly and referred her to the written guidelines. Pt seems happy to be going home. Luetta Nutting RN BSN 09/04/2018 9:59 AM

## 2018-09-04 NOTE — Progress Notes (Signed)
ANTICOAGULATION CONSULT NOTE  Pharmacy Consult for IV heparin + warfarin Indication: LV thrombus  Allergies  Allergen Reactions  . Other Shortness Of Breath    Environmental allergies  . Gluten Meal     Gluten makes her wheeze    Patient Measurements: Height: 5\' 1"  (154.9 cm) Weight: 197 lb 11.2 oz (89.7 kg) IBW/kg (Calculated) : 47.8 Heparin Dosing Weight: 70 kg  Vital Signs: Temp: 98.9 F (37.2 C) (03/02 0638) Temp Source: Oral (03/02 8546) BP: 109/75 (03/02 2703) Pulse Rate: 88 (03/02 0638)  Labs: Recent Labs    09/02/18 0504 09/03/18 0422 09/04/18 0435  HGB 12.3 11.7*  --   HCT 37.3 35.5*  --   PLT 408* 374  --   LABPROT 20.7* 22.2* 20.5*  INR 1.8* 2.0* 1.8*  HEPARINUNFRC 0.65 0.77*  --   CREATININE 0.67 0.96 0.79    Estimated Creatinine Clearance: 105.8 mL/min (by C-G formula based on SCr of 0.79 mg/dL).   Medical History: Past Medical History:  Diagnosis Date  . Abnormal vaginal Pap smear   . Acute systolic HF (heart failure) (HCC) 08/28/2018  . Anticoagulation goal of INR 2 to 3 09/03/2018  . Anxiety   . Asthma    HX - rarely uses inhaler - seasonal  . Bipolar 1 disorder (HCC)   . Depression   . History of depression   . History of trichomoniasis   . Hypertension   . 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 Pneumonia    CHILDHOOD  . 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 albuterol (PROVENTIL HFA;VENTOLIN HFA) 108 (90 Base) MCG/ACT inhaler Inhale 2 puffs into the lungs every 6 (six) hours as needed for wheezing or shortness of breath.   08/27/2018 at Unknown time  . amoxicillin-clavulanate (AUGMENTIN) 875-125 MG tablet Take 1 tablet by mouth every 12 (twelve) hours. 14 tablet 0 08/27/2018 at Unknown time  . benzonatate (TESSALON) 100 MG capsule Take 2 capsules (200 mg total) by mouth 2 (two) times daily as needed for  cough. 20 capsule 0 08/27/2018 at Unknown time  . buPROPion (WELLBUTRIN) 100 MG tablet Take 100 mg by mouth 2 (two) times daily.    08/27/2018 at Unknown time  . citalopram (CELEXA) 20 MG tablet Take 20 mg by mouth daily.   08/27/2018 at Unknown time  . ibuprofen (ADVIL,MOTRIN) 200 MG tablet Take 200 mg by mouth daily as needed for headache.    08/27/2018 at Unknown time  . loratadine (CLARITIN) 10 MG tablet Take 10 mg by mouth daily as needed for allergies.   08/27/2018 at Unknown time   Scheduled:  . aspirin EC  81 mg Oral Daily  . atorvastatin  80 mg Oral q1800  . carvedilol  3.125 mg Oral BID WC  . coumadin book   Does not apply Once  . enoxaparin (LOVENOX) injection  1 mg/kg Subcutaneous NOW  . sacubitril-valsartan  1 tablet Oral BID  . sodium chloride flush  3 mL Intravenous Q12H  . sodium chloride flush  3 mL Intravenous Q12H  . spironolactone  25 mg Oral QHS  . warfarin  10 mg Oral NOW  . Warfarin - Pharmacist Dosing Inpatient   Does not apply q1800   Assessment: 47 yoF with Hx anxiety, asthma, bipolar, depression, obesity, presenting with progressive SOB x 1 month. D-dimer elevated but CTA negative for PE. Echo shows severely  reduced LVEF with apical thrombus. Pharmacy to dose heparin IV and warfarin.  Underwent R/L cath on 2/26 showing subtotal occlusion of proxLAD (possibly SCAD) and normal filling pressures with preserved cardiac output.  Heparin stopped this weekend with INR 2.  INR fell to 1.8 today will boost warfarin dose today before she goes home today and will give a dose of Lovenox 1mg /kg x1 prior to dc as well. Hgb and pltc stable. No s/sx of bleeding noted.     Goal of Therapy: Heparin level 0.3-0.7 units/ml INR goal: 2-3 Monitor platelets by anticoagulation protocol: Yes  Plan: Lovenox 90mg  x1 (1mg /kg) Warfarin 10mg  x1 prior to DC today then warfarin 7.5mg  daily at discharged  Follow up in [facility] 09/06/18    Leota Sauers Pharm.D. CPP, BCPS Clinical  Pharmacist [phone] 09/04/2018 8:50 AM

## 2018-09-04 NOTE — Discharge Summary (Signed)
Advanced Heart Failure Discharge Note  Discharge Summary   Patient ID: Sheila Estes MRN: [id number], DOB/AGE: 1990/12/08 28 y.o. Admit date: 08/28/2018 D/C date:     09/04/2018   Primary Discharge Diagnoses:  1. Acute systolic CHF 2. LV thrombus - On coumadin 3. Elevated troponin  Hospital Course:  Sheila Estes is a 28 y.o. female with a history of obesity, anxiety, asthma, bipolar disorder, and depression.   One day a month ago, she drank 2 Red Bulls and afterwards felt horrible. She had pain across her chest and her heart raced for about 2 hours then went back to normal.  She became orthopneic and SOB. Seen at Urgent Care and told she had an asthma flare. Then seen at [facility] ER and told her had PNA. Treated with abx and felt a little better. Presented back to [facility] ER on 2/24 with pulmonary edema. Echo showed newly reduced EF 25-30% with peri-apical akinesis and apical thrombus. Transferred to [facility] for LHC. See below for problem based review of admission.   1. Acute systolic CHF: Patient presented with new-onset cardiomyopathy, echo with EF 25-30% with peri-apical akinesis and LV thrombus. Based on her symptoms and the presence of a thrombus, suspect this process has been present for at least a month. LHC/RHC showed normal filling pressures and preserved cardiac output. 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  Cardiac MRI suggested that LAD territory was infarcted and unlikely to be viable with restoration of perfusion.  - Volume well controlled OFF lasix.  - Continue spiro 25 mg qHS.  - Continue Entresto 24/26 mg BID.  - Continue coreg 3.125 mg BID (decreased with soft BPs) - Eventually, would like her on Bidil. Will hold off with soft BPs.  - As above, LAD territory is unlikely to be viable based on MRI, so would not pursue CTO procedure or CABG.  - Suspect MI about a month ago, not revascularized.   - LifeVest arranged for discharge.  - Repeat echo in 2  months. She will likely need ICD.  - She was referred to cardiac rehab.  2. LV thrombus: Bridged with heparin drip and coumadin. INR 1.8 on day of discharge. Given one dose of lovenox prior to discharge.  - She has follow up in coumadin clinic on Wednesday, 3/4.  3. Elevated troponin: TnI peaked at 0.33. Doubt ACS. Suspect demand ischemia from volume overload.  4. Depression/anxiety/bipolar disorder - She had stopped celexa and wellbutrin PTA. She will need to follow up with her PCP.   Discharge Weight Range: 198 lbs.  Discharge Vitals: Blood pressure 109/75, pulse 88, temperature 98.9 F (37.2 C), temperature source Oral, resp. rate 16, height 5\' 1"  (1.549 m), weight 89.7 kg, last menstrual period 08/25/2018, SpO2 99 %.  Labs: Lab Results  Component Value Date   WBC 5.0 09/03/2018   HGB 11.7 (L) 09/03/2018   HCT 35.5 (L) 09/03/2018   MCV 86.8 09/03/2018   PLT 374 09/03/2018    Recent Labs  Lab 08/28/18 1031  09/04/18 0435  NA  --    < > 139  K  --    < > 3.9  CL  --    < > 108  CO2  --    < > 22  BUN  --    < > 11  CREATININE  --    < > 0.79  CALCIUM  --    < > 8.6*  PROT 7.2  --   --  BILITOT 0.7  --   --   ALKPHOS 33*  --   --   ALT 16  --   --   AST 15  --   --   GLUCOSE  --    < > 97   < > = values in this interval not displayed.   No results found for: CHOL, HDL, LDLCALC, TRIG BNP (last 3 results) Recent Labs    08/28/18 1031  BNP 639.6*    ProBNP (last 3 results) No results for input(s): PROBNP in the last 8760 hours.   Diagnostic Studies/Procedures   Cardiac MRI 2018-09-27:  1. The LV was mildly dilated with EF 27%. Wall motion abnormalities as noted above, in a LAD-territory infarction pattern. Delayed enhancement images suggest a dense LAD-territory infarct, the myocardium is unlikely to be viable. There is an LV apical thrombus. 2. Mildly dilated RV with apical akinesis and overall EF 31%.  RHC/LHC 08/30/18: Coronary Findings  Diagnostic    Dominance: Right  Left Main  No left main, separate ostia for LAD and LCx.  Left Anterior Descending  The LAD is subtotally occluded proximally after a septal perforator. There is TIMI 2 flow in the mid to distal LAD. The appearance of the LAD suggests SCAD.  Left Circumflex  No significant disease.  Right Coronary Artery  No significant disease.  Intervention   No interventions have been documented.  Right Heart   Right Heart Pressures RHC Procedural Findings: Hemodynamics (mmHg) RA mean 1 RV 35/3 PA 36/8, mean 23 PCWP mean 12 LV 109/19 AO 107/79  Oxygen saturations: PA 67% AO 98%  Cardiac Output (Fick) 4.62  Cardiac Index (Fick) 2.46    Discharge Medications   Allergies as of 09/04/2018      Reactions   Other Shortness Of Breath   Environmental allergies   Gluten Meal    Gluten makes her wheeze      Medication List    STOP taking these medications   amoxicillin-clavulanate 875-125 MG tablet Commonly known as:  AUGMENTIN   buPROPion 100 MG tablet Commonly known as:  WELLBUTRIN   citalopram 20 MG tablet Commonly known as:  CELEXA   ibuprofen 200 MG tablet Commonly known as:  ADVIL,MOTRIN     TAKE these medications   albuterol 108 (90 Base) MCG/ACT inhaler Commonly known as:  PROVENTIL HFA;VENTOLIN HFA Inhale 2 puffs into the lungs every 6 (six) hours as needed for wheezing or shortness of breath.   atorvastatin 80 MG tablet Commonly known as:  LIPITOR Take 1 tablet (80 mg total) by mouth daily at 6 PM. Start taking on:  September 05, 2018   benzonatate 100 MG capsule Commonly known as:  TESSALON Take 2 capsules (200 mg total) by mouth 2 (two) times daily as needed for cough.   carvedilol 3.125 MG tablet Commonly known as:  COREG Take 1 tablet (3.125 mg total) by mouth 2 (two) times daily with a meal.   loratadine 10 MG tablet Commonly known as:  CLARITIN Take 10 mg by mouth daily as needed for allergies.   sacubitril-valsartan 24-26  MG Commonly known as:  ENTRESTO Take 1 tablet by mouth 2 (two) times daily.   spironolactone 25 MG tablet Commonly known as:  ALDACTONE Take 1 tablet (25 mg total) by mouth at bedtime.   warfarin 5 MG tablet Commonly known as:  COUMADIN Take 1.5 tablets (7.5 mg total) by mouth daily at 6 PM. Start taking on:  September 05, 2018  Durable Medical Equipment  (From admission, onward)         Start     Ordered   08/31/18 1002  For home use only DME Vest life vest  Once     08/31/18 1001          Disposition   The patient will be discharged in stable condition to home. Discharge Instructions    (HEART FAILURE PATIENTS) Call MD:  Anytime you have any of the following symptoms: 1) 3 pound weight gain in 24 hours or 5 pounds in 1 week 2) shortness of breath, with or without a dry hacking cough 3) swelling in the hands, feet or stomach 4) if you have to sleep on extra pillows at night in order to breathe.   Complete by:  As directed    Amb Referral to HF Clinic   Complete by:  As directed    Call MD for:   Complete by:  As directed    Please call heart failure clinic if systolic BP (top number) remains less than 90.   Call MD for:  persistant dizziness or light-headedness   Complete by:  As directed    Diet - low sodium heart healthy   Complete by:  As directed    Heart Failure patients record your daily weight using the same scale at the same time of day   Complete by:  As directed    Increase activity slowly   Complete by:  As directed    STOP any activity that causes chest pain, shortness of breath, dizziness, sweating, or exessive weakness   Complete by:  As directed      Follow-up Information    [facility] Follow up on 09/14/2018.   Specialty:  Cardiology Why:  Heart Failure F/U 09/14/18 @ 10am -Parking in ER lot (enter under blue awning to left of ER), or underneath [facility] in the [place] on [address] (garage code:[id number], elevator to 1st floor).  -Take all am meds and bring all med bottles  Contact information: [address] [id number] [place] [address] [phone]       [facility] [employer] [facility] Follow up on 09/06/2018.   Specialty:  Cardiology Why:  09/06/18 at 10:30 am.  Coumadin clinic appointment  Contact information: [address], [address] [place] [address] [phone]            Duration of Discharge Encounter: Greater than 35 minutes   Signed, Alford Highland, NP 09/04/2018, 9:13 AM

## 2018-09-04 NOTE — Progress Notes (Addendum)
Patient ID: Sheila Estes, female   DOB: 1991/03/12, 28 y.o.   MRN: [id number]     Advanced Heart Failure Rounding Note  PCP-Cardiologist: Marca Ancona, MD   Subjective:    Remains on heparin/coumadin bridge for LV thrombus. INR 2.0 ->1.8 today.   Planned for discharge yesterday, but then she was hypotensive with BP 83/48. Coreg was decreased. Entresto dose held last night. SBP 100s this morning.  Denies CP. Did not feel dizzy during episode of low BP yesterday, but says she was up and moving around, packing up. Her spine felt really warm, no other symptoms. She had an episode of SOB overnight, which resolved with inhaler.   Cardiac MRI:  1. The LV was mildly dilated with EF 27%. Wall motion abnormalities as noted above, in a LAD-territory infarction pattern. Delayed enhancement images suggest a dense LAD-territory infarct, the myocardium is unlikely to be viable. There is an LV apical thrombus. 2.  Mildly dilated RV with apical akinesis and overall EF 31%.  RHC/LHC: Coronary Findings  Diagnostic  Dominance: Right  Left Main  No left main, separate ostia for LAD and LCx.  Left Anterior Descending  The LAD is subtotally occluded proximally after a septal perforator. There is TIMI 2 flow in the mid to distal LAD. The appearance of the LAD suggests SCAD.  Left Circumflex  No significant disease.  Right Coronary Artery  No significant disease.  Intervention   No interventions have been documented.  Right Heart   Right Heart Pressures RHC Procedural Findings: Hemodynamics (mmHg) RA mean 1 RV 35/3 PA 36/8, mean 23 PCWP mean 12 LV 109/19 AO 107/79  Oxygen saturations: PA 67% AO 98%  Cardiac Output (Fick) 4.62  Cardiac Index (Fick) 2.46     Objective:   Weight Range: 89.7 kg Body mass index is 37.36 kg/m.   Vital Signs:   Temp:  [97.9 F (36.6 C)-99 F (37.2 C)] 98.9 F (37.2 C) (03/02 0638) Pulse Rate:  [79-88] 88 (03/02 0638) Resp:  [16-18] 16 (03/02  0638) BP: (83-109)/(48-75) 109/75 (03/02 0638) SpO2:  [95 %-100 %] 99 % (03/02 0786) Weight:  [89.7 kg] 89.7 kg (03/02 0638) Last BM Date: 09/02/18  Weight change: Filed Weights   09/02/18 0512 09/03/18 0606 09/04/18 7544  Weight: 88.7 kg 90.7 kg 89.7 kg    Intake/Output:   Intake/Output Summary (Last 24 hours) at 09/04/2018 0803 Last data filed at 09/04/2018 9201 Gross per 24 hour  Intake 720 ml  Output 200 ml  Net 520 ml      Physical Exam    General: Well appearing. No resp difficulty. HEENT: Normal Neck: Supple. JVP 5-6. Carotids 2+ bilat; no bruits. No thyromegaly or nodule noted. Cor: PMI nondisplaced. RRR, No M/G/R noted Lungs: CTAB, normal effort. Abdomen: Soft, non-tender, non-distended, no HSM. No bruits or masses. +BS  Extremities: No cyanosis, clubbing, or rash. R and LLE no edema.  Neuro: Alert & orientedx3, cranial nerves grossly intact. moves all 4 extremities w/o difficulty. Affect pleasant  Telemetry   NSR 70-80s. personally reviewed.   Labs    CBC Recent Labs    09/02/18 0504 09/03/18 0422  WBC 5.2 5.0  HGB 12.3 11.7*  HCT 37.3 35.5*  MCV 86.9 86.8  PLT 408* 374   Basic Metabolic Panel Recent Labs    00/71/21 0422 09/04/18 0435  NA 139 139  K 3.6 3.9  CL 107 108  CO2 23 22  GLUCOSE 108* 97  BUN 11 11  CREATININE 0.96  0.79  CALCIUM 8.8* 8.6*   Liver Function Tests No results for input(s): AST, ALT, ALKPHOS, BILITOT, PROT, ALBUMIN in the last 72 hours. No results for input(s): LIPASE, AMYLASE in the last 72 hours. Cardiac Enzymes No results for input(s): CKTOTAL, CKMB, CKMBINDEX, TROPONINI in the last 72 hours.  BNP: BNP (last 3 results) Recent Labs    08/28/18 1031  BNP 639.6*    ProBNP (last 3 results) No results for input(s): PROBNP in the last 8760 hours.   D-Dimer No results for input(s): DDIMER in the last 72 hours. Hemoglobin A1C No results for input(s): HGBA1C in the last 72 hours. Fasting Lipid Panel No  results for input(s): CHOL, HDL, LDLCALC, TRIG, CHOLHDL, LDLDIRECT in the last 72 hours. Thyroid Function Tests No results for input(s): TSH, T4TOTAL, T3FREE, THYROIDAB in the last 72 hours.  Invalid input(s): FREET3  Other results:   Imaging    No results found.   Medications:     Scheduled Medications: . aspirin EC  81 mg Oral Daily  . atorvastatin  80 mg Oral q1800  . carvedilol  3.125 mg Oral BID WC  . coumadin book   Does not apply Once  . sacubitril-valsartan  1 tablet Oral BID  . sodium chloride flush  3 mL Intravenous Q12H  . sodium chloride flush  3 mL Intravenous Q12H  . spironolactone  25 mg Oral Daily  . warfarin  5 mg Oral q1800  . Warfarin - Pharmacist Dosing Inpatient   Does not apply q1800    Infusions: . sodium chloride    . sodium chloride      PRN Medications: sodium chloride, sodium chloride, acetaminophen, albuterol, ALPRAZolam, benzonatate, loratadine, ondansetron (ZOFRAN) IV, sodium chloride flush, sodium chloride flush   Assessment/Plan   1. Acute systolic CHF: Patient presented with new-onset cardiomyopathy, echo with EF 25-30% with peri-apical akinesis and LV thrombus.  Based on her symptoms and the presence of a thrombus, I suspect this process has been present for at least a month.  LHC/RHC showed normal filling pressures and preserved cardiac output.  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  Cardiac MRI suggested that LAD territory was infarcted and unlikely to be viable with restoration of perfusion.  - Volume looks okay.  - Switch spiro 25 to bedtime to see if this helps with soft BP in am.  - Continue Entresto 24/26 mg BID. SBP 100s. Hopefully she can tolerate. May have to switch to losartan eventually.  - Continue coreg 3.125 mg bid  (decreased with soft BPs) - Eventually, would like her on Bidil. Will hold off with soft BPs.  - As above, LAD territory is unlikely to be viable based on MRI, so would not  pursue CTO procedure or CABG.  - Suspect MI about a month ago, not revascularized.   - LifeVest arranged for discharge.  - Cardiac rehab following.  - Repeat echo in 2 months. She will likely need ICD.  2. LV thrombus: Continue heparin gtt and bridge to therapeutic warfarin. INR 1.8.  - She has follow up in coumadin clinic on Wednesday.  3. Elevated troponin: TnI peaked at 0.33.  Doubt ACS.  Suspect demand ischemia from volume overload. No change.  Will discuss medications with MD. INR lower, but was therapeutic yesterday. Hopefully she can still discharge today.   Length of Stay: 7  Alford Highland, NP  09/04/2018, 8:03 AM  Advanced Heart Failure Team Pager [phone] (M-F; 7a - 4p)  Please contact [facility] for night-coverage after hours (4p -7a ) and weekends on [email or web address]  Patient seen with NP, agree with the above note.   I think she can go home today on lower dose of Coreg.  Stable today. INR was therapeutic yesterday, 1.8 today.  She can get a dose of Lovenox just prior to discharge and will adjust warfarin upwards.    Followup closely in CHF clinic.   Marca Ancona 09/04/2018 9:52 AM

## 2018-09-06 ENCOUNTER — Encounter: Payer: Self-pay | Admitting: *Deleted

## 2018-09-06 ENCOUNTER — Ambulatory Visit (INDEPENDENT_AMBULATORY_CARE_PROVIDER_SITE_OTHER): Payer: Self-pay | Admitting: *Deleted

## 2018-09-06 ENCOUNTER — Telehealth (HOSPITAL_COMMUNITY): Payer: Self-pay

## 2018-09-06 DIAGNOSIS — Z5181 Encounter for therapeutic drug level monitoring: Secondary | ICD-10-CM | POA: Insufficient documentation

## 2018-09-06 DIAGNOSIS — I236 Thrombosis of atrium, auricular appendage, and ventricle as current complications following acute myocardial infarction: Secondary | ICD-10-CM

## 2018-09-06 LAB — POCT INR: INR: 3.6 — AB (ref 2.0–3.0)

## 2018-09-06 NOTE — Patient Instructions (Addendum)
A full discussion of the nature of anticoagulants has been carried out.  A benefit risk analysis has been presented to the patient, so that they understand the justification for choosing anticoagulation at this time. The need for frequent and regular monitoring, precise dosage adjustment and compliance is stressed.  Side effects of potential bleeding are discussed.  The patient should avoid any OTC items containing aspirin or ibuprofen, and should avoid great swings in general diet.  Avoid alcohol consumption.  Call if any signs of abnormal bleeding.    Coumadin Clinic [phone] Main .[phone] Description   Do not take any Coumadin today then start taking 1 tablet daily except 1/2 tablet on Mondays and Fridays. Recheck INR in 1 week. Please call us with any questions or new medications. Coumadin Clinic [phone] Main .[phone]

## 2018-09-06 NOTE — Telephone Encounter (Signed)
Ok to go back to work next week as long as no heavy lifting.

## 2018-09-06 NOTE — Telephone Encounter (Signed)
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 Hospitalized 2/24-09/04/2018, needs note to be able to return to work per employer. Will send to DM for approval.

## 2018-09-07 ENCOUNTER — Encounter (HOSPITAL_COMMUNITY): Payer: Self-pay

## 2018-09-07 NOTE — Telephone Encounter (Signed)
Work note faxed with DM sig to return to work 09/11/2018 with no heavy lifting

## 2018-09-13 ENCOUNTER — Other Ambulatory Visit: Payer: Self-pay

## 2018-09-13 ENCOUNTER — Ambulatory Visit (INDEPENDENT_AMBULATORY_CARE_PROVIDER_SITE_OTHER): Payer: Self-pay | Admitting: *Deleted

## 2018-09-13 DIAGNOSIS — I236 Thrombosis of atrium, auricular appendage, and ventricle as current complications following acute myocardial infarction: Secondary | ICD-10-CM

## 2018-09-13 DIAGNOSIS — Z5181 Encounter for therapeutic drug level monitoring: Secondary | ICD-10-CM

## 2018-09-13 LAB — POCT INR: INR: 1.5 — AB (ref 2.0–3.0)

## 2018-09-13 NOTE — Patient Instructions (Signed)
Description   Today and tomorrow take 1.5 tablets then start taking 1 tablet daily. Recheck INR in 1 week. Please call us with any questions or new medications. [facility] [phone] Main .[phone]

## 2018-09-14 ENCOUNTER — Inpatient Hospital Stay (HOSPITAL_COMMUNITY): Payer: Medicaid Other

## 2018-09-20 ENCOUNTER — Ambulatory Visit (INDEPENDENT_AMBULATORY_CARE_PROVIDER_SITE_OTHER): Payer: Self-pay | Admitting: Pharmacist

## 2018-09-20 ENCOUNTER — Telehealth (HOSPITAL_COMMUNITY): Payer: Self-pay | Admitting: *Deleted

## 2018-09-20 ENCOUNTER — Other Ambulatory Visit: Payer: Self-pay

## 2018-09-20 DIAGNOSIS — Z5181 Encounter for therapeutic drug level monitoring: Secondary | ICD-10-CM

## 2018-09-20 DIAGNOSIS — I236 Thrombosis of atrium, auricular appendage, and ventricle as current complications following acute myocardial infarction: Secondary | ICD-10-CM

## 2018-09-20 LAB — POCT INR: INR: 2 (ref 2.0–3.0)

## 2018-09-20 NOTE — Telephone Encounter (Signed)
Contacted patient regarding referral to the cardiac rehab maintenance program. Patient states that she's unable to participate at this time for financial reasons. Pt states she is currently exercising with a friend, but may reconsider participation in the the cardiac rehab program if her circumstances change. Artist Pais, MS, ACSM CEP

## 2018-09-20 NOTE — Patient Instructions (Signed)
Continue taking 1 tablet every day. Recheck INR in 1 week. Please call us with any questions or new medications. [facility] [phone] Main .[phone]

## 2018-09-25 ENCOUNTER — Telehealth: Payer: Self-pay

## 2018-09-25 NOTE — Telephone Encounter (Signed)

## 2018-09-27 ENCOUNTER — Other Ambulatory Visit: Payer: Self-pay

## 2018-09-27 ENCOUNTER — Ambulatory Visit (INDEPENDENT_AMBULATORY_CARE_PROVIDER_SITE_OTHER): Payer: Self-pay | Admitting: Pharmacist

## 2018-09-27 DIAGNOSIS — Z5181 Encounter for therapeutic drug level monitoring: Secondary | ICD-10-CM

## 2018-09-27 DIAGNOSIS — I236 Thrombosis of atrium, auricular appendage, and ventricle as current complications following acute myocardial infarction: Secondary | ICD-10-CM

## 2018-09-27 LAB — POCT INR: INR: 2 (ref 2.0–3.0)

## 2018-10-03 ENCOUNTER — Ambulatory Visit: Payer: Self-pay | Admitting: Internal Medicine

## 2018-10-03 ENCOUNTER — Telehealth: Payer: Self-pay | Admitting: Pharmacist

## 2018-10-03 NOTE — Telephone Encounter (Signed)

## 2018-10-04 ENCOUNTER — Other Ambulatory Visit: Payer: Self-pay

## 2018-10-04 ENCOUNTER — Ambulatory Visit (INDEPENDENT_AMBULATORY_CARE_PROVIDER_SITE_OTHER): Payer: Self-pay | Admitting: Pharmacist

## 2018-10-04 DIAGNOSIS — I236 Thrombosis of atrium, auricular appendage, and ventricle as current complications following acute myocardial infarction: Secondary | ICD-10-CM

## 2018-10-04 DIAGNOSIS — Z5181 Encounter for therapeutic drug level monitoring: Secondary | ICD-10-CM

## 2018-10-04 LAB — POCT INR: INR: 2.6 (ref 2.0–3.0)

## 2018-10-10 ENCOUNTER — Telehealth: Payer: Self-pay

## 2018-10-10 NOTE — Telephone Encounter (Signed)

## 2018-10-11 ENCOUNTER — Ambulatory Visit (INDEPENDENT_AMBULATORY_CARE_PROVIDER_SITE_OTHER): Payer: Self-pay | Admitting: Pharmacist

## 2018-10-11 ENCOUNTER — Other Ambulatory Visit: Payer: Self-pay

## 2018-10-11 DIAGNOSIS — I236 Thrombosis of atrium, auricular appendage, and ventricle as current complications following acute myocardial infarction: Secondary | ICD-10-CM

## 2018-10-11 DIAGNOSIS — Z5181 Encounter for therapeutic drug level monitoring: Secondary | ICD-10-CM

## 2018-10-11 LAB — POCT INR: INR: 1.9 — AB (ref 2.0–3.0)

## 2018-10-12 ENCOUNTER — Encounter (HOSPITAL_COMMUNITY): Payer: Self-pay | Admitting: Cardiology

## 2018-10-12 ENCOUNTER — Ambulatory Visit: Payer: Medicaid Other | Admitting: Family Medicine

## 2018-10-12 ENCOUNTER — Telehealth (HOSPITAL_COMMUNITY): Payer: Self-pay | Admitting: Cardiology

## 2018-10-12 MED ORDER — LOSARTAN POTASSIUM 25 MG PO TABS: 25.0000 mg | ORAL_TABLET | Freq: Every day | ORAL | 3 refills | Status: DC

## 2018-10-12 NOTE — Telephone Encounter (Signed)
Lets start her on losartan 25 mg daily for the time being. Her blood pressure was borderline with Entresto anyway. We can reassess once we hear back from patient assistance, but make sure she know she should not be on both. Looks like she no showed to her appointment 3/12. We can do a telephone/doximity visit whenever works for her. Thanks

## 2018-10-12 NOTE — Telephone Encounter (Signed)
Pt called with concerns regarding entresto. Has already used 30 day co pay card Office is out of samples at this time Patient assistance application is PENDING and turn around time is generally 10-14 days.  Ok to change to another medication vs just waiting on patient assistance?  Also will patient need an appointment as her HFU was cancelled?

## 2018-10-12 NOTE — Telephone Encounter (Signed)
Patient aware medications will be changed temporarily. Script sent to pharmacy. Virtual followup 4/15 @ 130

## 2018-10-16 ENCOUNTER — Telehealth (HOSPITAL_COMMUNITY): Payer: Self-pay | Admitting: Cardiology

## 2018-10-16 MED ORDER — POTASSIUM CHLORIDE CRYS ER 20 MEQ PO TBCR: 20.0000 meq | EXTENDED_RELEASE_TABLET | Freq: Every day | ORAL | 3 refills | Status: DC

## 2018-10-16 MED ORDER — FUROSEMIDE 20 MG PO TABS: 20.0000 mg | ORAL_TABLET | ORAL | 3 refills | Status: DC | PRN

## 2018-10-16 NOTE — Telephone Encounter (Signed)
Patient called to report increased SOB and abdominal swelling. Reports she called EMS over the weekend and her b/p and HR were elevated during exam. Increased swelling in stomach is causing increased SOB and very uncomfortable at night.  No recent weights. Only spiro 25 daily  Already scheduled for followup on 4/15, would you like to move appt up?

## 2018-10-16 NOTE — Telephone Encounter (Signed)
Please call in lasix 20 mg to take PRN SOB or weight gain. She should also take 20 meq K when she takes the lasix. She should take today and tomorrow and then as needed. She really needs to weigh daily. Make sure she is wearing her LifeVest. She should also be on coreg 3.125 mg BID and losartan 25 mg daily. Can call in refills if needed. Those will help control her HR and BP, and more importantly help her heart get stronger. I can talk to her this afternoon if she would like or can keep tomorrow's appointment.

## 2018-10-16 NOTE — Telephone Encounter (Signed)
Pt aware and voiced understanding, reports she is taking coreg and losartan as prescribed.   Would appreciate call from provider on 4/14 as opposed to 4/15

## 2018-10-16 NOTE — Telephone Encounter (Signed)
Attempting to return call No answer, unable to leave message.

## 2018-10-17 ENCOUNTER — Ambulatory Visit (HOSPITAL_COMMUNITY)
Admission: RE | Admit: 2018-10-17 | Discharge: 2018-10-17 | Disposition: A | Discharge status: Home / Self Care | Payer: Medicaid Other | Source: Ambulatory Visit | Attending: Cardiology | Admitting: Cardiology

## 2018-10-17 ENCOUNTER — Other Ambulatory Visit: Payer: Self-pay

## 2018-10-17 ENCOUNTER — Encounter (HOSPITAL_COMMUNITY): Payer: Self-pay

## 2018-10-17 DIAGNOSIS — I429 Cardiomyopathy, unspecified: Secondary | ICD-10-CM

## 2018-10-17 DIAGNOSIS — I5021 Acute systolic (congestive) heart failure: Secondary | ICD-10-CM

## 2018-10-17 DIAGNOSIS — I236 Thrombosis of atrium, auricular appendage, and ventricle as current complications following acute myocardial infarction: Secondary | ICD-10-CM

## 2018-10-17 DIAGNOSIS — I5022 Chronic systolic (congestive) heart failure: Secondary | ICD-10-CM

## 2018-10-17 MED ORDER — SACUBITRIL-VALSARTAN 24-26 MG PO TABS: 1.0000 | ORAL_TABLET | Freq: Two times a day (BID) | ORAL | 6 refills | Status: DC

## 2018-10-17 MED ORDER — CARVEDILOL 6.25 MG PO TABS: 6.2500 mg | ORAL_TABLET | Freq: Two times a day (BID) | ORAL | 6 refills | Status: DC

## 2018-10-17 MED ORDER — CARVEDILOL 6.25 MG PO TABS: 3.1250 mg | ORAL_TABLET | Freq: Two times a day (BID) | ORAL | 6 refills | Status: DC

## 2018-10-17 MED FILL — CARVEDILOL 6.25 MG TABLET: 6.25 | 30 days supply | Qty: 60 | Fill #0

## 2018-10-17 MED FILL — ENTRESTO 24 MG-26 MG TABLET: 24-26 | 30 days supply | Qty: 60 | Fill #0

## 2018-10-17 NOTE — Addendum Note (Signed)
Encounter addended by: Marisa Hua, RN on: 10/17/2018 1:23 PM  Actions taken: Diagnosis association updated, Pharmacy for encounter modified, Order list changed, Clinical Note Signed

## 2018-10-17 NOTE — Progress Notes (Signed)
Spoke with patient and reviewed AVS.  Pt reports understanding. Questions answered. Pt will report to office on Friday for lab work.  AVS sent to mychart as previous indicated

## 2018-10-17 NOTE — Progress Notes (Signed)
Heart Failure TeleHealth Note  Due to national recommendations of social distancing due to COVID 19, Audio/video telehealth visit is felt to be most appropriate for this patient at this time.  See MyChart message from today for patient consent regarding telehealth for [facility].  Date:  10/17/2018   ID:  Sharmaine Base, DOB 31-Oct-1990, MRN [id number]  Location: Home  Provider location: [facility] Type of Visit: Established patient, hospital follow up  PCP:  System, Pcp Not In  Primary HF: Dr Shirlee Latch  Chief Complaint: HF follow up   History of Present Illness: Sheila Estes is a 28 y.o. female with a history of obesity, anxiety, asthma, bipolar disorder, and depression.   Admitted 2/23-09/04/18 with pulmonary edema. She had been SOB over the past month, which started after drinking 2 Red Bulls. She had been seen at Urgent Care and [facility] and was treated for asthma and for PNA. Echo showed newly reduced EF 25-30% with peri-apical akinesis and apical thrombus. Transferred to [facility] and had LHC/RHC, which showed normal filling pressures and preserved cardiac output. 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 Cardiac MRI suggested that LAD territory was infarcted and unlikely to be viable with restoration of perfusion. HF medications were optimized and she was set up with LifeVest for discharge. She was started on coumadin for LV thrombus. DC weight: 198 lbs.  She has been unable to get a refill of Entresto and HF clinic does not currently have samples. Patient assistance is pending. She was switched to losartan. She called yesterday with increased SOB and was prescribed 20 mg lasix to take for 2 days and then PRN. She had called 911 due to "spark" on right side of chest once/day over the last 3 days and worsening SOB. EMS said she had some fluid in lower lungs and in abdomen. She also had EKG done. HR was up to 100 and BP was also elevated.   She presents via Web designer for a telehealth visit today. Overall doing better after taking lasix yesterday. Today she is able to walk around with minimal SOB for 10 minutes, improved from 2 minutes yesterday. No SOB with rest. No peripheral edema. Abdomen still feels bloated, but improved today. No orthopnea or PND. No cough, fever, or chills. No exertional CP, dizziness, or palpitations. Has some positional chest discomfort. Appetite is good. No bleeding on coumadin. Followed by coumadin clinic. Only has medicaid family plan, so should be able to use HF fund. Working on Scientist, research (life sciences), CSW is supposed to send paperwork this week. She had an appointment at [facility] that got canceled last Friday. Denies edema. She is a Education officer, environmental. Able to get groceries, but limiting trips. Working from home. Taking all medications. Not currently wearing LifeVest because it gives her anxiety. LifeVest alarmed and she pushed button to stop it when she was up and moving around.   Weight: 202 lbs today.  She denies symptoms worrisome for COVID 19.   Past Medical History:  Diagnosis Date  . Abnormal vaginal Pap smear   . Acute systolic HF (heart failure) (HCC) 08/28/2018  . Anticoagulation goal of INR 2 to 3 09/03/2018  . Anxiety   . Asthma    HX - rarely uses inhaler - seasonal  . Bipolar 1 disorder (HCC)   . Depression   . History of depression   . History of trichomoniasis   . Hypertension   .  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 Pneumonia    CHILDHOOD  . 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 ARTHROSCOPTIC SHOULDER Left 2007  . CERVICAL CONIZATION W/BX N/A 12/14/2016   Procedure: COLD KNIFE CONIZATION, REMOVAL OF BODY JEWELRY;  Surgeon: Allendale BingPickens, Charlie, MD;  Location: [facility];  Service: Gynecology;  Laterality: N/A;  . RIGHT/LEFT HEART CATH AND CORONARY  ANGIOGRAPHY N/A 08/30/2018   Procedure: RIGHT/LEFT HEART CATH AND CORONARY ANGIOGRAPHY;  Surgeon: Laurey MoraleMcLean, Dalton S, MD;  [facility];  Service: Cardiovascular;  Laterality: N/A;     Current Outpatient Medications  Medication Sig Dispense Refill  . atorvastatin (LIPITOR) 80 MG tablet Take 1 tablet (80 mg total) by mouth daily at 6 PM. 30 tablet 6  . carvedilol (COREG) 3.125 MG tablet Take 1 tablet (3.125 mg total) by mouth 2 (two) times daily with a meal. 60 tablet 5  . furosemide (LASIX) 20 MG tablet Take 1 tablet (20 mg total) by mouth as needed. 90 tablet 3  . losartan (COZAAR) 25 MG tablet Take 1 tablet (25 mg total) by mouth daily. 90 tablet 3  . potassium chloride SA (K-DUR,KLOR-CON) 20 MEQ tablet Take 1 tablet (20 mEq total) by mouth daily. With every LASIX dose 90 tablet 3  . spironolactone (ALDACTONE) 25 MG tablet Take 1 tablet (25 mg total) by mouth at bedtime. 30 tablet 5  . warfarin (COUMADIN) 5 MG tablet Take 1.5 tablets (7.5 mg total) by mouth daily at 6 PM. 45 tablet 5  . albuterol (PROVENTIL HFA;VENTOLIN HFA) 108 (90 Base) MCG/ACT inhaler Inhale 2 puffs into the lungs every 6 (six) hours as needed for wheezing or shortness of breath.    . benzonatate (TESSALON) 100 MG capsule Take 2 capsules (200 mg total) by mouth 2 (two) times daily as needed for cough. 20 capsule 0  . loratadine (CLARITIN) 10 MG tablet Take 10 mg by mouth daily as needed for allergies.     No current facility-administered medications for this encounter.     Allergies:   Other and Gluten meal   Social History:  The patient  reports that she has never smoked. She has never used smokeless tobacco. She reports current alcohol use. She reports that she does not use drugs.   Family History:  The patient's family history includes Diabetes in her maternal grandmother; Hypertension in her father, maternal grandfather, and mother.   ROS:  Please see the history of present illness.   All other  systems are personally reviewed and negative.   Exam:  (Video/Tele Health Call; Exam is subjective and or/visual.) General:  Speaks in full sentences. No resp difficulty. Lungs: Normal respiratory effort with conversation.  Abdomen: +distended per patient report Extremities: Pt denies edema. Neuro: Alert & oriented x 3.   Recent Labs: 08/28/2018: ALT 16; B Natriuretic Peptide 639.6 08/29/2018: TSH 2.144 09/01/2018: Magnesium 2.2 09/03/2018: Hemoglobin 11.7; Platelets 374 09/04/2018: BUN 11; Creatinine, Ser 0.79; Potassium 3.9; Sodium 139  Personally reviewed   Wt Readings from Last 3 Encounters:  09/04/18 89.7 kg (197 lb 11.2 oz)  08/20/18 90.7 kg (200 lb)  08/19/18 90.7 kg (200 lb)      ASSESSMENT AND PLAN:  1. Acute systolic CHF: Patient presented with new-onset cardiomyopathy, echo with EF 25-30% with peri-apical akinesis and LV thrombus. Based on her symptoms and the presence of a thrombus, suspect this process has been present for at  least a month. LHC/RHC showed normal filling pressures and preserved cardiac output. 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 Cardiac MRI suggested that LAD territory was infarcted and unlikely to be viable with restoration of perfusion.  - NYHA III. Volume sounds elevated. - Continue lasix 20 mg PRN with 20 meq K. She will take at least today, probably tomorrow as well.  -Continue spiro 25 mg qHS.  - Stop losartan and restart Entresto 24/26 mg BID. Patient assistance approved today per patient. Will fill through OP pharmacy and HF fund if needed. -Increase coreg to 6.25mg  BID  - Eventually, would like her on Bidil. - As above, LAD territory is unlikely to be viable based on MRI, so would not pursue CTO procedure or CABG. Suspect MI about a month prior to admission, not revascularized.  -Continue LifeVest. Encouraged her to wear. She will start wearing again today. I will check in with Rep to see what alarm was.   - Repeat echo in June. She will likely need ICD. - She declined cardiac rehab. Refer to home health cardiac rehab once medicaid approval  - She is using condoms for birth control - Discussed slowly increasing activity level. - Gets labs at next INR check.   2. LV thrombus:  - Continue coumadin. Followed by [facility].     COVID screen The patient does not have any symptoms that suggest any further testing/ screening at this time.  Social distancing reinforced today.  Relevant cardiac medications were reviewed at length with the patient today.   The patient does not have concerns regarding their medications at this time. Only refill she needs is xanax and will get her in with PCP to manage this.  The following changes were made today: Increase coreg to 6.25 mg BID. Stop losartan and restart Entresto 24/26 mg BID. Send to OP clinic to be filled with HF fund. Will see if our CSW can get her a sooner PCP appointment at [facility]. I will also speak to our LifeVest rep about the alarm that went off. Will see if it's possible for her to send [place] her EKG done by EMS by mychart.   Recommended follow-up:  2 weeks follow up on phone with me. Echo in June with Shirlee Latch, BMET and CBC at next INR appointment (4/17).  Today, I have spent 36 minutes with the patient with telehealth technology discussing the above issues .    Signed, Alford Highland, NP  10/17/2018 10:42 AM  [facility] [address] [facility] [place] [address] [phone] (office) [phone] (fax)

## 2018-10-17 NOTE — Progress Notes (Signed)
Attempted to reach patient, no answer and unable to leave VM.  AVS sent via mychart and will attempt another call to discuss.

## 2018-10-17 NOTE — Patient Instructions (Addendum)
Increase coreg to 6.25 mg BID. Prescription was sent to [facility]: [id number] [place]  Stop losartan   RESTART Entresto 24/26 mg (1 tab) twice a day. A prescription was sent to [facility] on Friday We will only contact you if something comes back abnormal or we need to make some changes. Otherwise no news is good news!   Our Social Worker Eileen Stanford will work on getting you an appointment soon with [facility] to establish care.   Our Nurse Pracitioner will call the  LifeVest rep to see what arrhythmia you were having   Your physician recommends that you schedule a follow-up appointment in: Keep your appointment in 2 weeks with Dr. Shirlee Latch, It will be a phone or virtual visit.  Your echo will be rescheduled to early June. You will be called to schedule this appointment.

## 2018-10-17 NOTE — Addendum Note (Signed)
Encounter addended by: Marisa Hua, RN on: 10/17/2018 12:07 PM  Actions taken: Clinical Note Signed, Pharmacy for encounter modified, Order list changed, Diagnosis association updated

## 2018-10-18 ENCOUNTER — Telehealth (HOSPITAL_COMMUNITY): Payer: Medicaid Other

## 2018-10-18 ENCOUNTER — Telehealth: Payer: Self-pay

## 2018-10-18 ENCOUNTER — Telehealth (HOSPITAL_COMMUNITY): Payer: Self-pay | Admitting: Licensed Clinical Social Worker

## 2018-10-18 NOTE — Telephone Encounter (Signed)
Message received from Rosetta Posner, [place] requesting an appointment for patient to establish care.  Call placed to patient and the appointment was scheduled for tomorrow - 10/19/2018 @ 1530.  The patient is agreeable to televisit or in person visit.  Update provided to [place].

## 2018-10-18 NOTE — Telephone Encounter (Signed)
CSW informed by physician that pt was supposed to have appt on 4/9 which had to be canceled and had to be rescheduled for May.  Physician is concerned for pt to wait that long to get established with a PCP due to patients anxiety and need to have prescriber for her xanax.  CSW reached out to [facility] where pt is supposed to be seen to see if an earlier appointment can be arranged- they have spoken with pt and will see her 4/16 to establish care.  CSW will continue to follow and assist as needed  Burna Sis, LCSW Clinical Social Worker [facility] Desk#: [phone] Cell#: [phone]

## 2018-10-19 ENCOUNTER — Telehealth: Payer: Self-pay

## 2018-10-19 ENCOUNTER — Ambulatory Visit: Payer: Self-pay | Admitting: Primary Care

## 2018-10-19 ENCOUNTER — Other Ambulatory Visit: Payer: Self-pay

## 2018-10-19 NOTE — Telephone Encounter (Signed)
Unable to lmom for prescreen vm is full

## 2018-10-20 ENCOUNTER — Other Ambulatory Visit (HOSPITAL_COMMUNITY): Payer: Medicaid Other

## 2018-10-20 ENCOUNTER — Other Ambulatory Visit: Payer: Self-pay

## 2018-10-20 ENCOUNTER — Ambulatory Visit (INDEPENDENT_AMBULATORY_CARE_PROVIDER_SITE_OTHER): Payer: Self-pay

## 2018-10-20 DIAGNOSIS — Z5181 Encounter for therapeutic drug level monitoring: Secondary | ICD-10-CM

## 2018-10-20 DIAGNOSIS — I236 Thrombosis of atrium, auricular appendage, and ventricle as current complications following acute myocardial infarction: Secondary | ICD-10-CM

## 2018-10-20 LAB — POCT INR: INR: 1.7 — AB (ref 2.0–3.0)

## 2018-10-20 NOTE — Patient Instructions (Signed)
Description   Called spoke with pt, advised to take 1.5 tablets tonight, then start taking 1 tablet daily except 1.5 tablets on Mondays. Recheck INR in 10 days. Please call us with any questions or new medications. Coumadin Clinic [phone] Main [phone]

## 2018-10-23 ENCOUNTER — Encounter: Payer: Self-pay | Admitting: Primary Care

## 2018-10-23 ENCOUNTER — Other Ambulatory Visit: Payer: Self-pay

## 2018-10-23 ENCOUNTER — Ambulatory Visit: Payer: Self-pay | Attending: Primary Care | Admitting: Primary Care

## 2018-10-23 VITALS — BP 138/86 | HR 87 | Temp 98.8°F | Resp 18 | Ht 61.0 in | Wt 199.0 lb

## 2018-10-23 DIAGNOSIS — Z6841 Body Mass Index (BMI) 40.0 and over, adult: Secondary | ICD-10-CM

## 2018-10-23 DIAGNOSIS — I5021 Acute systolic (congestive) heart failure: Secondary | ICD-10-CM

## 2018-10-23 DIAGNOSIS — Z7689 Persons encountering health services in other specified circumstances: Secondary | ICD-10-CM

## 2018-10-23 DIAGNOSIS — J4520 Mild intermittent asthma, uncomplicated: Secondary | ICD-10-CM

## 2018-10-23 NOTE — Progress Notes (Signed)
New Patient Office Visit  Subjective:  Patient ID: Sheila Estes, female    DOB: [birth year]  Age: 28 y.o. MRN: [id number]  CC:  Chief Complaint  Patient presents with  . Establish Care     Sheila Estes presents for to establish care and a hospital follow up with new dx CHF.Past medical history of obesity, anxiety, asthma, bipolar disorder and depression. She presented to the ED with progressively worsening SOB since then, unable to sleep, wakes up at 2-3 am with SOB and cough and has to sit up in order to catch breath.   Past Medical History:  Diagnosis Date  . Abnormal vaginal Pap smear   . Acute systolic HF (heart failure) (HCC) 08/28/2018  . Anticoagulation goal of INR 2 to 3 09/03/2018  . Anxiety   . Asthma    HX - rarely uses inhaler - seasonal  . Bipolar 1 disorder (HCC)   . Depression   . History of depression   . History of trichomoniasis   . Hypertension   . 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 Pneumonia    CHILDHOOD  . 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 ARTHROSCOPTIC SHOULDER Left 2007  . CERVICAL CONIZATION W/BX N/A 12/14/2016   Procedure: COLD KNIFE CONIZATION, REMOVAL OF BODY JEWELRY;  Surgeon:  Bing, MD;  Location: [facility];  Service: Gynecology;  Laterality: N/A;  . RIGHT/LEFT HEART CATH AND CORONARY ANGIOGRAPHY N/A 08/30/2018   Procedure: RIGHT/LEFT HEART CATH AND CORONARY ANGIOGRAPHY;  Surgeon: Laurey Morale, MD;  Location: [facility] INVASIVE CV LAB;  Service: Cardiovascular;  Laterality: N/A;    Family History  Problem Relation Age of Onset  . Hypertension Mother   . Hypertension Father   . Diabetes Maternal Grandmother   . Hypertension Maternal Grandfather     Social History   Socioeconomic History  . Marital status: Single    Spouse name: Not on file  . Number of children: Not on file  . Years of education: Not on file  . Highest  education level: Not on file  Occupational History  . Not on file  Social Needs  . Financial resource strain: Not on file  . Food insecurity:    Worry: Not on file    Inability: Not on file  . Transportation needs:    Medical: Not on file    Non-medical: Not on file  Tobacco Use  . Smoking status: Never Smoker  . Smokeless tobacco: Never Used  Substance and Sexual Activity  . Alcohol use: Yes    Comment: socially wine  . Drug use: No  . Sexual activity: Yes    Birth control/protection: Condom  Lifestyle  . Physical activity:    Days per week: Not on file    Minutes per session: Not on file  . Stress: Not on file  Relationships  . Social connections:    Talks on phone: Not on file    Gets together: Not on file    Attends religious service: Not on file    Active member of club or organization: Not on file    Attends meetings of clubs or organizations: Not on file    Relationship status: Not on file  . Intimate partner violence:    Fear of current or ex partner: Not on file    Emotionally abused: Not on file    Physically abused: Not on file  Forced sexual activity: Not on file  Other Topics Concern  . Not on file  Social History Narrative  . Not on file    ROS Review of Systems  Constitutional: Positive for activity change and fatigue.       Decrease secondary to SOB asthma and high pollen  HENT: Negative.   Eyes: Negative.   Respiratory: Positive for cough and shortness of breath.   Cardiovascular: Positive for dyspnea on exertion.  Gastrointestinal: Negative.   Endocrine: Negative.   Genitourinary: Negative.   Musculoskeletal: Negative.   Skin: Negative.   Allergic/Immunologic: Positive for environmental allergies.  Neurological: Negative.   Hematological: Negative.   Psychiatric/Behavioral: Positive for sleep disturbance.    Objective:   Today's Vitals: BP 138/86 (BP Location: Right Arm, Patient Position: Sitting, Cuff Size: Normal)   Pulse 87    Temp 98.8 F (37.1 C) (Oral)   Resp 18   Ht 5\' 1"  (1.549 m)   Wt 199 lb (90.3 kg)   SpO2 99%   BMI 37.60 kg/m    Physical Exam Constitutional:      Appearance: Normal appearance.  HENT:     Head: Normocephalic.     Right Ear: Tympanic membrane normal.     Left Ear: Tympanic membrane normal.     Nose: Nose normal.     Mouth/Throat:     Mouth: Mucous membranes are moist.  Eyes:     Extraocular Movements: Extraocular movements intact.  Neck:     Musculoskeletal: Normal range of motion and neck supple.  Cardiovascular:     Rate and Rhythm: Normal rate and regular rhythm.  Pulmonary:     Effort: Pulmonary effort is normal.     Breath sounds: Normal breath sounds.  Abdominal:     General: Bowel sounds are normal. There is distension.     Palpations: Abdomen is soft.  Skin:    General: Skin is warm and dry.  Neurological:     Mental Status: She is alert.  Psychiatric:        Mood and Affect: Mood normal.     Assessment & Plan:  Sheila Estes was seen today for establish care.  Diagnoses and all orders for this visit:  Encounter to establish care After ED to hospitalization new dx CHF   Acute systolic HF (heart failure) (HCC)  Congestive Heart Failure   Presents for follow-up visit. Associated symptoms include fatigue and shortness of breath. The symptoms have been stable. Compliance with total regimen is 76-100%. Compliance with diet is 51-75%. Compliance with exercise is 51-75%. Compliance with medications is 76-100%.  Followed by Cardiology and on anti coagulation therapy    Mild intermittent asthma, unspecified whether complicated She complains of cough and shortness of breath. This is a chronic problem. The current episode started more than 1 year ago. The problem occurs intermittently. The problem has been gradually improving. The cough is non-productive. Associated symptoms include dyspnea on exertion. Her symptoms are aggravated by emotional stress and exercise. Her  symptoms are alleviated by beta-agonist and steroid inhaler. She reports moderate improvement on treatment. Risk factors for lung disease include occupational exposure. Her past medical history is significant for asthma.   BMI 40.0-44.9, adult [facility]) Discussed necessity of weigh loss , diet and exercise.     Problem List Items Addressed This Visit    BMI 40.0-44.9, adult (HCC)   Acute systolic HF (heart failure) (HCC)    Other Visit Diagnoses    Encounter to establish care    -  Primary   Mild intermittent asthma, unspecified whether complicated          Outpatient Encounter Medications as of 10/23/2018  Medication Sig  . albuterol (PROVENTIL HFA;VENTOLIN HFA) 108 (90 Base) MCG/ACT inhaler Inhale 2 puffs into the lungs every 6 (six) hours as needed for wheezing or shortness of breath.  Marland Kitchen. atorvastatin (LIPITOR) 80 MG tablet Take 1 tablet (80 mg total) by mouth daily at 6 PM.  . benzonatate (TESSALON) 100 MG capsule Take 2 capsules (200 mg total) by mouth 2 (two) times daily as needed for cough.  . carvedilol (COREG) 6.25 MG tablet Take 1 tablet (6.25 mg total) by mouth 2 (two) times daily with a meal.  . furosemide (LASIX) 20 MG tablet Take 1 tablet (20 mg total) by mouth as needed.  . loratadine (CLARITIN) 10 MG tablet Take 10 mg by mouth daily as needed for allergies.  Marland Kitchen. losartan (COZAAR) 25 MG tablet Take 1 tablet (25 mg total) by mouth daily.  . potassium chloride SA (K-DUR,KLOR-CON) 20 MEQ tablet Take 1 tablet (20 mEq total) by mouth daily. With every LASIX dose  . sacubitril-valsartan (ENTRESTO) 24-26 MG Take 1 tablet by mouth 2 (two) times daily.  Marland Kitchen. spironolactone (ALDACTONE) 25 MG tablet Take 1 tablet (25 mg total) by mouth at bedtime.  Marland Kitchen. warfarin (COUMADIN) 5 MG tablet Take 1.5 tablets (7.5 mg total) by mouth daily at 6 PM.   No facility-administered encounter medications on file as of 10/23/2018.     Follow-up: Return in about 3 months (around 01/22/2019) for Asthma , obesity.    Grayce SessionsMichelle P Ramesh Moan, NP

## 2018-10-27 ENCOUNTER — Telehealth: Payer: Self-pay

## 2018-10-27 NOTE — Telephone Encounter (Signed)

## 2018-10-30 ENCOUNTER — Other Ambulatory Visit: Payer: Self-pay

## 2018-10-30 ENCOUNTER — Ambulatory Visit (INDEPENDENT_AMBULATORY_CARE_PROVIDER_SITE_OTHER): Payer: Self-pay | Admitting: *Deleted

## 2018-10-30 DIAGNOSIS — Z5181 Encounter for therapeutic drug level monitoring: Secondary | ICD-10-CM

## 2018-10-30 DIAGNOSIS — I236 Thrombosis of atrium, auricular appendage, and ventricle as current complications following acute myocardial infarction: Secondary | ICD-10-CM

## 2018-10-30 LAB — POCT INR: INR: 1.8 — AB (ref 2.0–3.0)

## 2018-10-30 NOTE — Patient Instructions (Signed)
Description   Spoke with pt and instructed pt to take 2 tablets tonight, then start taking 1 tablet daily except 1.5 tablets on Mondays and Fridays. Recheck INR in 2 weeks. Please call us with any questions or new medications. [facility] [phone] Main [phone]

## 2018-11-02 ENCOUNTER — Telehealth (HOSPITAL_COMMUNITY): Payer: Self-pay

## 2018-11-02 NOTE — Telephone Encounter (Signed)
Pt LM on voicemail that she had a question to ask.  When called back pt reports that she is having numbness to her right arm while driving.  She denies discoloration to hand/arm.  Pt denies any cardiac symptoms.  Pt reports she had a slight headache as well.  She reports she did not eat all day.  Advised she should go to emergency dept to be evaluated.  She states she will try to eat and see if her symptoms resolve.  Advised numbness is not normal and she should be evaluated. Verbalized understanding.

## 2018-11-03 ENCOUNTER — Telehealth (HOSPITAL_COMMUNITY): Payer: Self-pay | Admitting: Licensed Clinical Social Worker

## 2018-11-03 ENCOUNTER — Telehealth (HOSPITAL_COMMUNITY): Payer: Self-pay

## 2018-11-03 ENCOUNTER — Ambulatory Visit (HOSPITAL_COMMUNITY): Payer: Medicaid Other

## 2018-11-03 ENCOUNTER — Ambulatory Visit (HOSPITAL_COMMUNITY)
Admission: RE | Admit: 2018-11-03 | Discharge: 2018-11-03 | Disposition: A | Discharge status: Home / Self Care | Payer: Medicaid Other | Source: Ambulatory Visit | Attending: Cardiology | Admitting: Cardiology

## 2018-11-03 ENCOUNTER — Other Ambulatory Visit: Payer: Self-pay

## 2018-11-03 DIAGNOSIS — I255 Ischemic cardiomyopathy: Secondary | ICD-10-CM

## 2018-11-03 DIAGNOSIS — I5022 Chronic systolic (congestive) heart failure: Secondary | ICD-10-CM

## 2018-11-03 NOTE — Telephone Encounter (Signed)
CSW consulted to help pt obtain a BP Cuff.  CSW spoke with pt who confirmed her shipping address.  Pt agreeable to having BP cuff supplied by clinic- BP cuff being shipped to pt home anticipated delivery is May 9th.  CSW will continue to follow and assist as needed  Burna Sis, LCSW Clinical Social Worker [facility] Desk#: [phone] Cell#: [phone]

## 2018-11-03 NOTE — Telephone Encounter (Signed)
Reviewed AVS, ECHO 6/2 @10am  & lab work 5/7 @1130am . Pt deneid any further questions

## 2018-11-05 NOTE — Progress Notes (Signed)
Heart Failure TeleHealth Note  Due to national recommendations of social distancing due to COVID 19, Audio/video telehealth visit is felt to be most appropriate for this patient at this time.  See MyChart message from today for patient consent regarding telehealth for [facility].  Date:  11/05/2018   ID:  Sheila BaseBethany Estes, DOB 02/05/1991, MRN [id number]  Location: Home  Provider location: [place] [facility] Type of Visit: Established patient   PCP:  System, Pcp Not In  Cardiologist:  Dr. Shirlee LatchMcLean  Chief Complaint: Shortness of breath   History of Present Illness: Sheila Estes is a 28 y.o. female who presents via audio/video conferencing for a telehealth visit today.     she denies symptoms worrisome for COVID 19.   Admitted 2/23-09/04/18 with pulmonary edema. She had been short of breath for about a month, which started after drinking 2 Red Bulls. She had been seen at Urgent Care and [facility] ER and was treated for asthma and for PNA. Echo at 2/20 admission showed newly reduced EF 25-30% with peri-apical akinesis and apical thrombus. Transfer[facility] and had LHC/RHC, which showed normal filling pressures and preserved cardiac output. The proximal LAD was subtotally occluded, it looked like a SCAD event. Cardiac MRI suggested that LAD territory was infarcted and unlikely to be viable with restoration of perfusion.HF medications were optimized and she was set up with LifeVest for discharge. She was started on coumadin for LV thrombus. DC weight: 198 lbs.  She has done well since discharge. However, the Lifevest makes her anxious and she is not wearing it regularly. She was of Entresto for a while, now back on it.  She feels better on Entresto.  Occasional spells of lightheadedness but not to the point of passing out or falling. She is not able to check her BP.  No problems with coumadin, no BRBPR/melena.  She can walk 2 flights of stairs without dyspnea.  No dyspnea walking on  flat ground.  She has not had to use Lasix.   ECG (2/20, personally reviewed): NSR, old anterior MI  Labs (3/20): K 3.9, creatinine 0.79  PMH: 1. Depression 2. Anxiety 3. Asthma 4. Chronic systolic CHF: Ischemic cardiomyopathy.   - Echo (2/20) with EF 25-30%, peri-apical akinesis, LV thrombus.  - LHC/RHC (2/20): Subtotal LAD occlusion after S1.  Appearance of LAD suggests SCAD.  Mean RA 1, PA 36/8 mean 23, mean PCWP 12, CI 2.46.  - Cardiac MRI (2/20): Mildly dilated LV with EF 27%, delayed enhancement images suggest a dense LAD-territory infarct (myocardium unlikely to be viable), LV apical thrombus, RV EF 31%.  5. CAD: LHC (2/20) with subtotal LAD occlusion after S1.  Appearance of LAD suggests SCAD.  Current Outpatient Medications  Medication Sig Dispense Refill   albuterol (PROVENTIL HFA;VENTOLIN HFA) 108 (90 Estes) MCG/ACT inhaler Inhale 2 puffs into the lungs every 6 (six) hours as needed for wheezing or shortness of breath.     atorvastatin (LIPITOR) 80 MG tablet Take 1 tablet (80 mg total) by mouth daily at 6 PM. 30 tablet 6   benzonatate (TESSALON) 100 MG capsule Take 2 capsules (200 mg total) by mouth 2 (two) times daily as needed for cough. 20 capsule 0   carvedilol (COREG) 6.25 MG tablet Take 1 tablet (6.25 mg total) by mouth 2 (two) times daily with a meal. 180 tablet 6   furosemide (LASIX) 20 MG tablet Take 1 tablet (20 mg total) by mouth as needed. 90 tablet 3  loratadine (CLARITIN) 10 MG tablet Take 10 mg by mouth daily as needed for allergies.     losartan (COZAAR) 25 MG tablet Take 1 tablet (25 mg total) by mouth daily. 90 tablet 3   potassium chloride SA (K-DUR,KLOR-CON) 20 MEQ tablet Take 1 tablet (20 mEq total) by mouth daily. With every LASIX dose 90 tablet 3   sacubitril-valsartan (ENTRESTO) 24-26 MG Take 1 tablet by mouth 2 (two) times daily. 60 tablet 6   spironolactone (ALDACTONE) 25 MG tablet Take 1 tablet (25 mg total) by mouth at bedtime. 30 tablet 5    warfarin (COUMADIN) 5 MG tablet Take 1.5 tablets (7.5 mg total) by mouth daily at 6 PM. 45 tablet 5   No current facility-administered medications for this encounter.     Allergies:   Other and Gluten meal   Social History:  The patient  reports that she has never smoked. She has never used smokeless tobacco. She reports current alcohol use. She reports that she does not use drugs.   Family History:  The patient's family history includes Diabetes in her maternal grandmother; Hypertension in her father, maternal grandfather, and mother.   ROS:  Please see the history of present illness.   All other systems are personally reviewed and negative.   Exam:  (Video/Tele Health Call; Exam is subjective and or/visual.) General:  Speaks in full sentences. No resp difficulty. Neck: No JVD Lungs: Normal respiratory effort with conversation.  Abdomen: Non-distended per patient report Extremities: Pt denies edema. Neuro: Alert & oriented x 3.   Recent Labs: 08/28/2018: ALT 16; B Natriuretic Peptide 639.6 08/29/2018: TSH 2.144 09/01/2018: Magnesium 2.2 09/03/2018: Hemoglobin 11.7; Platelets 374 09/04/2018: BUN 11; Creatinine, Ser 0.79; Potassium 3.9; Sodium 139  Personally reviewed   Wt Readings from Last 3 Encounters:  10/23/18 90.3 kg (199 lb)  09/04/18 89.7 kg (197 lb 11.2 oz)  08/20/18 90.7 kg (200 lb)      ASSESSMENT AND PLAN:  1. 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  RHC in 2/20 showed normal filling pressures and preserved cardiac output.  Cardiac MRI in 2/20 suggested that LAD territory was infarcted and unlikely to be viable with restoration of perfusion.  Stable weight, not volume overloaded on exam.  She has occasional "dizzy spells," so since she does not currently have a cuff to check her BP, I will not change her meds.  - Continue spironolactone 25 mg daily.  -  Continue Entresto 24/26 mg BID.  - Continue coreg 6.25 mg bid.  - Eventually, would like her on Bidil.  - As above, LAD territory is unlikely to be viable based on MRI, so would not pursue CTO procedure or CABG.  - Continue to wear Lifevest as frequently as possible.  Will plan for echo in 6/20, if EF remains < 35%, will be ICD candidate (narrow QRS so no CRT).  - She will get a BP cuff at the pharmacy and check BP daily, she will call in 1 week with readings.  - I will arrange for a BMET.   2. LV thrombus: Continue warfarin.  No BRBPR/melena.  - I will arrange for CBC.  COVID screen The patient does not have any symptoms that suggest any further testing/ screening at this time.  Social distancing reinforced today.  Patient Risk: After full review of this patients clinical status, I feel that they are at moderate  risk for cardiac decompensation at this time.  Relevant cardiac medications were reviewed at length with the patient today. The patient does not have concerns regarding their medications at this time.   Recommended follow-up:  6/20 with echo  Today, I have spent 21 minutes with the patient with telehealth technology discussing the above issues .    Signed, Marca Ancona, MD  11/05/2018 10:24 PM  [facility] [address] [facility] [place] [address] [phone] (office) [phone] (fax)

## 2018-11-09 ENCOUNTER — Other Ambulatory Visit (HOSPITAL_COMMUNITY): Payer: Medicaid Other

## 2018-11-10 ENCOUNTER — Telehealth: Payer: Self-pay

## 2018-11-10 NOTE — Telephone Encounter (Signed)

## 2018-11-13 ENCOUNTER — Other Ambulatory Visit: Payer: Self-pay

## 2018-11-13 ENCOUNTER — Ambulatory Visit (INDEPENDENT_AMBULATORY_CARE_PROVIDER_SITE_OTHER): Payer: Self-pay | Admitting: *Deleted

## 2018-11-13 DIAGNOSIS — I236 Thrombosis of atrium, auricular appendage, and ventricle as current complications following acute myocardial infarction: Secondary | ICD-10-CM

## 2018-11-13 DIAGNOSIS — Z5181 Encounter for therapeutic drug level monitoring: Secondary | ICD-10-CM

## 2018-11-13 LAB — POCT INR: INR: 1.8 — AB (ref 2.0–3.0)

## 2018-11-13 NOTE — Patient Instructions (Addendum)
Description   Spoke with pt and instructed pt to take 2.5 tablets tonight, then start taking 1 tablet daily except 2 tablets on Mondays, Wednesdays, and Fridays (pt was taking 2 on M/F & 1qd-per her change over last 2 wks). Recheck INR in 2 weeks. Please call us with any questions or new medications. Cou[facility]in Clinic [phone] Main [phone]

## 2018-11-16 MED FILL — ENTRESTO 24 MG-26 MG TABLET: 24-26 | 30 days supply | Qty: 60 | Fill #1

## 2018-11-17 ENCOUNTER — Ambulatory Visit: Payer: Self-pay | Admitting: Internal Medicine

## 2018-11-22 ENCOUNTER — Ambulatory Visit: Payer: Medicaid Other | Admitting: Family Medicine

## 2018-11-22 ENCOUNTER — Ambulatory Visit: Payer: Medicaid Other | Admitting: Primary Care

## 2018-11-28 ENCOUNTER — Encounter: Payer: Self-pay | Admitting: Primary Care

## 2018-11-28 ENCOUNTER — Other Ambulatory Visit: Payer: Self-pay

## 2018-11-28 ENCOUNTER — Ambulatory Visit: Payer: Self-pay | Attending: Primary Care | Admitting: Primary Care

## 2018-11-28 ENCOUNTER — Telehealth: Payer: Self-pay

## 2018-11-28 DIAGNOSIS — R87610 Atypical squamous cells of undetermined significance on cytologic smear of cervix (ASC-US): Secondary | ICD-10-CM

## 2018-11-28 DIAGNOSIS — K2100 Gastro-esophageal reflux disease with esophagitis, without bleeding: Secondary | ICD-10-CM

## 2018-11-28 DIAGNOSIS — I5021 Acute systolic (congestive) heart failure: Secondary | ICD-10-CM

## 2018-11-28 DIAGNOSIS — R1013 Epigastric pain: Secondary | ICD-10-CM

## 2018-11-28 NOTE — Telephone Encounter (Signed)
Unable to lmom voice mailbox was full and cannot accept any messages at this time

## 2018-11-28 NOTE — Progress Notes (Signed)
Patient verified DOB Patient has not taken medication. Patient has not eaten today. Patient denies pain at this time.  Patient complains of tenderness in the middle of her chest for the past few months. Patient states sucking in her stomach is when she feels the discomfort.

## 2018-11-28 NOTE — Progress Notes (Signed)
Virtual Visit via Telephone Note  I connected with Sheila Estes on 11/28/18 at  8:50 AM EDT by telephone and verified that I am speaking with the correct person using two identifiers.   I discussed the limitations, risks, security and privacy concerns of performing an evaluation and management service by telephone and the availability of in person appointments. I also discussed with the patient that there may be a patient responsible charge related to this service. The patient expressed understanding and agreed to proceed.   History of Present Illness: Ms. Sheila Estes is being seen today for pain in the middle of abdominal states feels likes a muscle strain. Denies injury or lifting or straining for BM. No increased pain with menes. Pain does increase with deep breath. Observations/Objective: Review of Systems  Constitutional: Negative.   HENT: Negative.   Eyes: Negative.   Respiratory: Positive for shortness of breath.   Cardiovascular: Negative.   Gastrointestinal: Positive for heartburn.  Genitourinary: Negative.   Musculoskeletal: Negative.   Skin: Negative.     Assessment and Plan: Sheila Estes was seen today for follow-up.  Diagnoses and all orders for this visit:  Epigastric pain Abdominal painc differential dx denied constipation, Yes to the following gas bloating overeating, stress or muscle strain.Increased pain with deep breath. Monitor closely   Acute systolic HF (heart failure) (HCC) Followed by cardiology and INR . Son diuretics denies edema and INR   Atypical squamous cells of undetermined significance on cytologic smear of cervix (ASC-US) We will schedule a Pap with hx of abnormal. Colposcopy done 2018.  Gastroesophageal reflux disease with esophagitis Add PPI take prn  S/s probable peptic ulcer if cont refer to GI for endoscopy     Follow Up Instructions:    I discussed the assessment and treatment plan with the patient. The patient was provided an opportunity to  ask questions and all were answered. The patient agreed with the plan and demonstrated an understanding of the instructions.   The patient was advised to call back or seek an in-person evaluation if the symptoms worsen or if the condition fails to improve as anticipated.  I provided 17 minutes of non-face-to-face time during this encounter.   Grayce Sessions, NP

## 2018-12-05 ENCOUNTER — Encounter (HOSPITAL_COMMUNITY): Payer: Medicaid Other

## 2018-12-05 ENCOUNTER — Ambulatory Visit (HOSPITAL_COMMUNITY): Admission: RE | Admit: 2018-12-05 | Payer: Self-pay | Source: Ambulatory Visit

## 2018-12-21 ENCOUNTER — Ambulatory Visit (INDEPENDENT_AMBULATORY_CARE_PROVIDER_SITE_OTHER): Payer: Self-pay | Admitting: Pharmacist

## 2018-12-21 ENCOUNTER — Other Ambulatory Visit: Payer: Self-pay

## 2018-12-21 DIAGNOSIS — I236 Thrombosis of atrium, auricular appendage, and ventricle as current complications following acute myocardial infarction: Secondary | ICD-10-CM

## 2018-12-21 DIAGNOSIS — Z5181 Encounter for therapeutic drug level monitoring: Secondary | ICD-10-CM

## 2018-12-21 LAB — POCT INR: INR: 1.6 — AB (ref 2.0–3.0)

## 2018-12-21 NOTE — Patient Instructions (Signed)
Spoke with pt and instructed pt to take 2 tablets tonight, 2.5 tablets tomorrow night, then continue taking 1 tablet daily except 2 tablets on Mondays, Wednesdays, and Fridays. Recheck INR in 2 weeks. Please call us with any questions or new medications. [facility] [phone] Main [phone]

## 2018-12-25 ENCOUNTER — Telehealth (HOSPITAL_COMMUNITY): Payer: Self-pay | Admitting: Cardiology

## 2018-12-25 NOTE — Telephone Encounter (Signed)
Courtney from [employer] was calling in regards to results from the patient's monitor. She was looking to get a result of the test, as well as treatment notes for the last visit the patient had with Loralie Champagne. Records show that her last appt with Loralie Champagne was a Virtual Visit done on 11/03/18.  You can reach out to her at the number provided, or fax the information to [phone].

## 2018-12-26 ENCOUNTER — Ambulatory Visit (HOSPITAL_COMMUNITY)
Admission: RE | Admit: 2018-12-26 | Discharge: 2018-12-26 | Disposition: A | Discharge status: Home / Self Care | Payer: Medicaid Other | Source: Ambulatory Visit | Attending: Cardiology | Admitting: Cardiology

## 2018-12-26 ENCOUNTER — Other Ambulatory Visit: Payer: Self-pay

## 2018-12-26 DIAGNOSIS — I255 Ischemic cardiomyopathy: Secondary | ICD-10-CM | POA: Insufficient documentation

## 2018-12-26 LAB — BASIC METABOLIC PANEL
Anion gap: 8 (ref 5–15)
BUN: 9 mg/dL (ref 6–20)
CO2: 25 mmol/L (ref 22–32)
Calcium: 8.8 mg/dL — ABNORMAL LOW (ref 8.9–10.3)
Chloride: 108 mmol/L (ref 98–111)
Creatinine, Ser: 0.83 mg/dL (ref 0.44–1.00)
GFR calc Af Amer: >60 mL/min (ref >60)
GFR calc non Af Amer: >60 mL/min (ref >60)
Glucose, Bld: 94 mg/dL (ref 70–99)
Potassium: 3.6 mmol/L (ref 3.5–5.1)
Sodium: 141 mmol/L (ref 135–145)

## 2018-12-26 LAB — CBC
HCT: 35.7 % — ABNORMAL LOW (ref 36.0–46.0)
Hemoglobin: 11.4 g/dL — ABNORMAL LOW (ref 12.0–15.0)
MCH: 27.9 pg (ref 26.0–34.0)
MCHC: 31.9 g/dL (ref 30.0–36.0)
MCV: 87.3 fL (ref 80.0–100.0)
Platelets: 249 10*3/uL (ref 150–400)
RBC: 4.09 MIL/uL (ref 3.87–5.11)
RDW: 15.9 % — ABNORMAL HIGH (ref 11.5–15.5)
WBC: 4.7 10*3/uL (ref 4.0–10.5)
nRBC: 0 % (ref 0.0–0.2)

## 2018-12-27 MED FILL — CARVEDILOL 6.25 MG TABLET: 6.25 | 30 days supply | Qty: 60 | Fill #1

## 2018-12-27 MED FILL — ENTRESTO 24 MG-26 MG TABLET: 24-26 | 30 days supply | Qty: 60 | Fill #2

## 2019-01-01 ENCOUNTER — Telehealth: Payer: Self-pay

## 2019-01-01 NOTE — Telephone Encounter (Signed)

## 2019-01-08 ENCOUNTER — Other Ambulatory Visit: Payer: Self-pay

## 2019-01-08 ENCOUNTER — Ambulatory Visit (INDEPENDENT_AMBULATORY_CARE_PROVIDER_SITE_OTHER): Payer: Self-pay | Admitting: *Deleted

## 2019-01-08 DIAGNOSIS — Z5181 Encounter for therapeutic drug level monitoring: Secondary | ICD-10-CM

## 2019-01-08 DIAGNOSIS — I236 Thrombosis of atrium, auricular appendage, and ventricle as current complications following acute myocardial infarction: Secondary | ICD-10-CM

## 2019-01-08 LAB — POCT INR: INR: 2.5 (ref 2.0–3.0)

## 2019-01-08 NOTE — Patient Instructions (Signed)
Description    Continue taking 1 tablet daily except 2 tablets on Mondays, Wednesdays, and Fridays. Recheck INR in 3 weeks. Please call us with any questions or new medications. [facility] [phone] Main [phone]

## 2019-01-25 ENCOUNTER — Telehealth: Payer: Self-pay | Admitting: *Deleted

## 2019-01-25 ENCOUNTER — Encounter (HOSPITAL_COMMUNITY): Payer: Medicaid Other

## 2019-01-25 ENCOUNTER — Ambulatory Visit (HOSPITAL_COMMUNITY): Admission: RE | Admit: 2019-01-25 | Payer: Self-pay | Source: Ambulatory Visit

## 2019-01-25 NOTE — Telephone Encounter (Signed)

## 2019-01-29 ENCOUNTER — Other Ambulatory Visit: Payer: Self-pay

## 2019-01-29 ENCOUNTER — Ambulatory Visit (INDEPENDENT_AMBULATORY_CARE_PROVIDER_SITE_OTHER): Payer: Self-pay | Admitting: *Deleted

## 2019-01-29 DIAGNOSIS — Z5181 Encounter for therapeutic drug level monitoring: Secondary | ICD-10-CM

## 2019-01-29 DIAGNOSIS — I236 Thrombosis of atrium, auricular appendage, and ventricle as current complications following acute myocardial infarction: Secondary | ICD-10-CM

## 2019-01-29 LAB — POCT INR: INR: 1.5 — AB (ref 2.0–3.0)

## 2019-01-29 NOTE — Patient Instructions (Signed)
Description   Today take 2.5 tablets and tomorrow take 1.5 tablets then continue taking 1 tablet daily except 2 tablets on Mondays, Wednesdays, and Fridays. Recheck INR in 1 week. Please call us with any questions or new medications. [facility] [phone] Main [phone]

## 2019-02-13 ENCOUNTER — Other Ambulatory Visit: Payer: Self-pay

## 2019-02-13 ENCOUNTER — Ambulatory Visit (INDEPENDENT_AMBULATORY_CARE_PROVIDER_SITE_OTHER): Payer: Self-pay | Admitting: Pharmacist

## 2019-02-13 DIAGNOSIS — Z5181 Encounter for therapeutic drug level monitoring: Secondary | ICD-10-CM

## 2019-02-13 DIAGNOSIS — I236 Thrombosis of atrium, auricular appendage, and ventricle as current complications following acute myocardial infarction: Secondary | ICD-10-CM

## 2019-02-13 LAB — POCT INR: INR: 1.6 — AB (ref 2.0–3.0)

## 2019-02-13 NOTE — Patient Instructions (Signed)
Description   Take 2 tablets today then start taking 2 tablets daily except 1 tablet on Sundays, Tuesdays and Thursdays.  Recheck INR in 2 weeks. Please call us with any questions or new medications. [facility] [phone] Main [phone]

## 2019-02-22 ENCOUNTER — Telehealth (HOSPITAL_COMMUNITY): Payer: Self-pay | Admitting: Licensed Clinical Social Worker

## 2019-02-22 ENCOUNTER — Ambulatory Visit (HOSPITAL_COMMUNITY)
Admission: RE | Admit: 2019-02-22 | Discharge: 2019-02-22 | Disposition: A | Discharge status: Home / Self Care | Payer: Self-pay | Source: Ambulatory Visit | Attending: Cardiology | Admitting: Cardiology

## 2019-02-22 ENCOUNTER — Ambulatory Visit (HOSPITAL_BASED_OUTPATIENT_CLINIC_OR_DEPARTMENT_OTHER)
Admission: RE | Admit: 2019-02-22 | Discharge: 2019-02-22 | Disposition: A | Discharge status: Home / Self Care | Payer: Self-pay | Source: Ambulatory Visit | Attending: Internal Medicine | Admitting: Internal Medicine

## 2019-02-22 ENCOUNTER — Other Ambulatory Visit: Payer: Self-pay

## 2019-02-22 VITALS — BP 130/89 | HR 99 | Wt 199.6 lb

## 2019-02-22 DIAGNOSIS — I255 Ischemic cardiomyopathy: Secondary | ICD-10-CM

## 2019-02-22 DIAGNOSIS — I236 Thrombosis of atrium, auricular appendage, and ventricle as current complications following acute myocardial infarction: Secondary | ICD-10-CM

## 2019-02-22 DIAGNOSIS — I2510 Atherosclerotic heart disease of native coronary artery without angina pectoris: Secondary | ICD-10-CM | POA: Insufficient documentation

## 2019-02-22 DIAGNOSIS — I5022 Chronic systolic (congestive) heart failure: Secondary | ICD-10-CM

## 2019-02-22 DIAGNOSIS — J45909 Unspecified asthma, uncomplicated: Secondary | ICD-10-CM | POA: Insufficient documentation

## 2019-02-22 DIAGNOSIS — R0602 Shortness of breath: Secondary | ICD-10-CM | POA: Insufficient documentation

## 2019-02-22 DIAGNOSIS — I5023 Acute on chronic systolic (congestive) heart failure: Secondary | ICD-10-CM | POA: Insufficient documentation

## 2019-02-22 DIAGNOSIS — I252 Old myocardial infarction: Secondary | ICD-10-CM | POA: Insufficient documentation

## 2019-02-22 DIAGNOSIS — Z79899 Other long term (current) drug therapy: Secondary | ICD-10-CM | POA: Insufficient documentation

## 2019-02-22 DIAGNOSIS — Z7901 Long term (current) use of anticoagulants: Secondary | ICD-10-CM | POA: Insufficient documentation

## 2019-02-22 DIAGNOSIS — Z8249 Family history of ischemic heart disease and other diseases of the circulatory system: Secondary | ICD-10-CM | POA: Insufficient documentation

## 2019-02-22 LAB — BASIC METABOLIC PANEL
Anion gap: 8 (ref 5–15)
BUN: 9 mg/dL (ref 6–20)
CO2: 24 mmol/L (ref 22–32)
Calcium: 8.8 mg/dL — ABNORMAL LOW (ref 8.9–10.3)
Chloride: 108 mmol/L (ref 98–111)
Creatinine, Ser: 0.82 mg/dL (ref 0.44–1.00)
GFR calc Af Amer: >60 mL/min (ref >60)
GFR calc non Af Amer: >60 mL/min (ref >60)
Glucose, Bld: 92 mg/dL (ref 70–99)
Potassium: 3.4 mmol/L — ABNORMAL LOW (ref 3.5–5.1)
Sodium: 140 mmol/L (ref 135–145)

## 2019-02-22 MED ORDER — LOSARTAN POTASSIUM 25 MG PO TABS: 25.0000 mg | ORAL_TABLET | Freq: Every day | ORAL | 3 refills | Status: DC

## 2019-02-22 MED ORDER — ENTRESTO 24-26 MG PO TABS: 1.0000 | ORAL_TABLET | Freq: Two times a day (BID) | ORAL | 3 refills | Status: DC

## 2019-02-22 NOTE — Telephone Encounter (Signed)
CSW received return message from [employer] financial department informing CSW that pt does currently have family planning medicaid and that the next best course of action should be for pt to contact her Family planning medicaid case worker to discuss if she would be eligible for ongoing medicaid.  CSW updated patient and will continue to follow and assist as needed  Jorge Ny, [facility] Desk#: [phone] Cell#: [phone]

## 2019-02-22 NOTE — Progress Notes (Signed)
  Echocardiogram 2D Echocardiogram has been performed.  Sheila Estes 02/22/2019, 10:26 AM

## 2019-02-22 NOTE — Progress Notes (Signed)
CSW consulted to help with medication cost concerns and to answer questions regarding disability and Medicaid.  CSW first helped pt complete [employer] application.  Application to be completed and submitted for review.  Pt states she is interested in applying for Medicaid.  CSW completed chart review and saw that pt had been working on [employer] following recent hospital stay but that they had stopped working with her after patient was unable to provide requested paperwork on time.  CSW reaching out to DSS worker who was helping with her case to see if this can be reopened or if pt will need to reapply.  CSW provided pt with paper medicaid app and instructed on how to apply online.  Pt also wanted information on applying for social security.  CSW provided with phone number to social security office and instructed to how to set up phone interview.  Pt would prefer to work at this time but is not sure if she can with her current condition.  Pt somewhat overwhelmed by current medical condition- had felt like she was getting better but was told her heart was worse.  CSW encouraged pt to continue working if she felt up to it and the MDs didn't say it was a bad idea- explained that disability is often a long process and no guarantee that she would be approved at the end of it and if she was able to find a job that she could do with her current condition and fulfilled her than that might be a better long term plan.  CSW will continue to follow and assist as needed  Jorge Ny, [facility] Desk#: [phone] Cell#: [phone]

## 2019-02-22 NOTE — Progress Notes (Signed)
Date:  02/22/2019   ID:  Sheila Estes, DOB 10-21-1990, MRN [id number]  Location: Home  Provider location: [place] [facility] Type of Visit: Established patient   PCP:  Sheila Estes, Sheila P, NP  Cardiologist:  Dr. Shirlee Estes  Chief Complaint: Shortness of breath   History of Present Illness: Ms Sheila Estes is a 28 year old with a history of history of obesity, anxiety, asthma, bipolar disorder,  Depression, and CAD, chronic systolic heart failure.  Admitted 2/23-09/04/18 with pulmonary edema. She had been short of breath for about a month, which started after drinking 2 Red Bulls. She had been seen at Urgent Care and [facility] and was treated for asthma and for PNA. Echo at 2/20 admission showed newly reduced EF 25-30% with peri-apical akinesis and apical thrombus. [facility] and had LHC/RHC, which showed normal filling pressures and preserved cardiac output. The proximal LAD was subtotally occluded, it looked like a SCAD event. Cardiac MRI suggested that LAD territory was infarcted and unlikely to be viable with restoration of perfusion.HF medications were optimized and she was set up with LifeVest for discharge. She was started on coumadin for LV thrombus. DC weight: 198 lbs.  Today she returns for HF follow up. Overall feeling fine. Says she feels better than she has felt in a while. She sent back Life Vest last week and says she didn't wear it.  Mild SOB with steps. Denies PND/Orthopnea. No chest pain. Appetite ok. No fever or chills. Weight at home 198-199 pounds. Says she has not taken coreg or entresto for the last 3 days. Lives with her 30 year old daughter. Currently not working.   ECG (2/20, personally reviewed): NSR, old anterior MI  Labs (3/20): K 3.9, creatinine 0.79  PMH: 1. Depression 2. Anxiety 3. Asthma 4. Chronic systolic CHF: Ischemic cardiomyopathy.   - Echo (2/20) with EF 25-30%, peri-apical akinesis, LV thrombus.  - LHC/RHC (2/20): Subtotal LAD  occlusion after S1.  Appearance of LAD suggests SCAD.  Mean RA 1, PA 36/8 mean 23, mean PCWP 12, CI 2.46.  - Cardiac MRI (2/20): Mildly dilated LV with EF 27%, delayed enhancement images suggest a dense LAD-territory infarct (myocardium unlikely to be viable), LV apical thrombus, RV EF 31%.  5. CAD: LHC (2/20) with subtotal LAD occlusion after S1.  Appearance of LAD suggests SCAD.  Current Outpatient Medications  Medication Sig Dispense Refill  . albuterol (PROVENTIL HFA;VENTOLIN HFA) 108 (90 Base) MCG/ACT inhaler Inhale 2 puffs into the lungs every 6 (six) hours as needed for wheezing or shortness of breath.    Marland Kitchen. atorvastatin (LIPITOR) 80 MG tablet Take 1 tablet (80 mg total) by mouth daily at 6 PM. 30 tablet 6  . carvedilol (COREG) 6.25 MG tablet Take 1 tablet (6.25 mg total) by mouth 2 (two) times daily with a meal. 180 tablet 6  . furosemide (LASIX) 20 MG tablet Take 1 tablet (20 mg total) by mouth as needed. 90 tablet 3  . loratadine (CLARITIN) 10 MG tablet Take 10 mg by mouth daily as needed for allergies.    . potassium chloride SA (K-DUR,KLOR-CON) 20 MEQ tablet Take 1 tablet (20 mEq total) by mouth daily. With every LASIX dose 90 tablet 3  . sacubitril-valsartan (ENTRESTO) 24-26 MG Take 1 tablet by mouth 2 (two) times daily. 60 tablet 6  . spironolactone (ALDACTONE) 25 MG tablet Take 1 tablet (25 mg total) by mouth at bedtime. 30 tablet 5  . warfarin (COUMADIN) 5  MG tablet Take 1.5 tablets (7.5 mg total) by mouth daily at 6 PM. 45 tablet 5  . losartan (COZAAR) 25 MG tablet Take 1 tablet (25 mg total) by mouth daily. (Patient not taking: Reported on 02/22/2019) 90 tablet 3   No current facility-administered medications for this encounter.     Allergies:   Other and Gluten meal   Social History:  The patient  reports that she has never smoked. She has never used smokeless tobacco. She reports current alcohol use. She reports that she does not use drugs.   Family History:  The patient's  family history includes Diabetes in her maternal grandmother; Hypertension in her father, maternal grandfather, and mother.   ROS:  Please see the history of present illness.   All other systems are personally reviewed and negative.  Vitals:   02/22/19 1023  BP: 130/89  Pulse: 99  SpO2: 95%   Exam:  General:  Well appearing. No resp difficulty HEENT: normal Neck: supple. no JVD. Carotids 2+ bilat; no bruits. No lymphadenopathy or thryomegaly appreciated. Cor: PMI nondisplaced. Regular rate & rhythm. No rubs, gallops or murmurs. Lungs: clear Abdomen: soft, nontender, nondistended. No hepatosplenomegaly. No bruits or masses. Good bowel sounds. Extremities: no cyanosis, clubbing, rash, edema Neuro: alert & orientedx3, cranial nerves grossly intact. moves all 4 extremities w/o difficulty. Affect pleasant  Recent Labs: 08/28/2018: ALT 16; B Natriuretic Peptide 639.6 08/29/2018: TSH 2.144 09/01/2018: Magnesium 2.2 12/26/2018: BUN 9; Creatinine, Ser 0.83; Hemoglobin 11.4; Platelets 249; Potassium 3.6; Sodium 141     Wt Readings from Last 3 Encounters:  02/22/19 90.5 kg (199 lb 9.6 oz)  10/23/18 90.3 kg (199 lb)  09/04/18 89.7 kg (197 lb 11.2 oz)      ASSESSMENT AND PLAN:  1. 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  RHC in 2/20 showed normal filling pressures and preserved cardiac output.  Cardiac MRI in 2/20 suggested that LAD territory was infarcted and unlikely to be viable with restoration of perfusion. Discussed the need to avoid pregnancy and to follow up with GYN for options. Currently using condoms. She understands she need to contact HF clinic if she is gets pregnant so we adjust HF medications.  I discussed ECHO results today. EF remains severely reduced EF 20-25% and RV moderately reduced. She returned Technical sales engineerLife Vest and says she is not going to wear it.  - NYHA II-III.  Volume status stable.  - Continue spironolactone 25 mg daily.  -She does not have insurance and we will try to start entresto at the next visit. Start losartan 25 mg daily.  - Continue coreg 6.25 mg bid.  - Eventually, would like her on Bidil but she has not insurance.  - As above, LAD territory is unlikely to be viable based on MRI, so would not pursue CTO procedure or CABG.   2. LV thrombus: Continue warfarin.  INR followed at coumadin clinic.    3. CAD - On high dose statin and coumadin.    LAD territory is unlikely to be viable based on MRI, so would not pursue CTO procedure or CABG.   Today we spent a lot time discussing heart failure and the importance of taking medications. She said multiple times during the visit she did not know that her heart was weak. She refused to wear life vest. I have asked her to follow up in 2 weekd for medication titration.  We discussed ICD but she wants to hold off. Referred to HF SW for assistance with medications and disability.   Greater than 50% of the (total minutes 40 ) visit spent in counseling/coordination of care regarding the above.   Jeanmarie Hubert, NP  02/22/2019 11:01 AM  [facility] [address] [place] [address] [phone] (office) [phone] (fax)

## 2019-02-22 NOTE — Patient Instructions (Addendum)
Labs done today. We will call you only if your labs are abnormal.  We have refilled your Carvedilol  STOP Entresto START Losartan 25 mg, one tab daily  Your physician recommends that you schedule a follow-up appointment in: 2 weeks with Amy Clegg,NP    At the [facility], you and your health needs are our priority. As part of our continuing mission to provide you with exceptional heart care, we have created designated Provider Care Teams. These Care Teams include your primary Cardiologist (physician) and Advanced Practice Providers (APPs- Physician Assistants and Nurse Practitioners) who all work together to provide you with the care you need, when you need it.   You may see any of the following providers on your designated Care Team at your next follow up: Marland Kitchen Dr Glori Bickers . Dr Loralie Champagne . Darrick Grinder, NP   Please be sure to bring in all your medications bottles to every appointment.

## 2019-02-28 ENCOUNTER — Telehealth (HOSPITAL_COMMUNITY): Payer: Self-pay | Admitting: Pharmacy Technician

## 2019-02-28 MED FILL — CARVEDILOL 6.25 MG TABLET: 6.25 | 30 days supply | Qty: 60 | Fill #2

## 2019-02-28 NOTE — Telephone Encounter (Signed)
Patient was approved to receive Entresto through [employer].   ID: [id number]  Effective Dates: 02/27/2019 through 02/27/2020.  Phone Number: [phone]  Called and made patient aware of approval, gave phone number to [employer] as well.  Charlann Boxer, CPhT

## 2019-03-14 ENCOUNTER — Telehealth (HOSPITAL_COMMUNITY): Payer: Self-pay

## 2019-03-14 NOTE — Telephone Encounter (Signed)
Called to review lab work. No answer. Left voicemail.

## 2019-03-14 NOTE — Telephone Encounter (Signed)
-----   Message from [facility], NP sent at 03/13/2019 10:16 AM EDT ----- Please call increase K Dur to 40 meq daily.

## 2019-03-16 ENCOUNTER — Encounter (HOSPITAL_COMMUNITY): Payer: Medicaid Other

## 2019-04-02 ENCOUNTER — Telehealth: Payer: Self-pay

## 2019-04-02 NOTE — Telephone Encounter (Signed)
Patient called, informed that starting and stopping warfarin or her other medications shouldn't have an effect on her menstrual cycle. She also asked if any of her medications could cause a false negative home pregnancy test. I advised that possibly the diuretics (entresto and spironolactone) since they dilute the urine. Would recommend taking test first thing in the AM when urine is the most concentrated or contact PCP of OBGYN for blood test.  Spironolactone can cause amenorrhea- although patient was on in the past and this didn't occur, still a possibility. Patient made aware and thankful for the call.

## 2019-04-02 NOTE — Telephone Encounter (Signed)
° ° °  If Home Health RN is calling please get Coumadin Nurse on the phone STAT  1.  Are you calling in regards to an appointment? appt made for 9/30  2.  Are you calling for a refill ? no  3.  Are you having bleeding issues? Patient stopped taking Coumadin and has restarted taking recently. Concerned that she has not had a menstrual cycle since August. She would like to know if this could be from abruptly stopping and starting medication  4.  Do you need clearance to hold Coumadin? no   Please route to the Coumadin Clinic Pool

## 2019-04-04 ENCOUNTER — Ambulatory Visit (INDEPENDENT_AMBULATORY_CARE_PROVIDER_SITE_OTHER): Payer: Medicaid Other | Admitting: Primary Care

## 2019-04-04 ENCOUNTER — Other Ambulatory Visit: Payer: Self-pay

## 2019-04-04 ENCOUNTER — Ambulatory Visit (HOSPITAL_COMMUNITY)
Admission: EM | Admit: 2019-04-04 | Discharge: 2019-04-04 | Discharge status: Against Medical Advice | Payer: Medicaid Other

## 2019-04-04 ENCOUNTER — Ambulatory Visit (INDEPENDENT_AMBULATORY_CARE_PROVIDER_SITE_OTHER): Payer: Self-pay | Admitting: *Deleted

## 2019-04-04 DIAGNOSIS — I236 Thrombosis of atrium, auricular appendage, and ventricle as current complications following acute myocardial infarction: Secondary | ICD-10-CM

## 2019-04-04 DIAGNOSIS — Z5181 Encounter for therapeutic drug level monitoring: Secondary | ICD-10-CM

## 2019-04-04 LAB — POCT INR: INR: 1.2 — AB (ref 2.0–3.0)

## 2019-04-04 NOTE — ED Notes (Signed)
[facility]

## 2019-04-04 NOTE — Patient Instructions (Addendum)
Description   Take 3 tablets today and 2 tablets tomorrow then continue taking 2 tablets daily except 1 tablet on Sundays, Tuesdays and Thursdays.  Recheck INR in 1 week. Please call us with any questions or new medications. Coumadin Clinic [phone] Main [phone]

## 2019-04-09 ENCOUNTER — Ambulatory Visit (INDEPENDENT_AMBULATORY_CARE_PROVIDER_SITE_OTHER): Payer: Medicaid Other | Admitting: Primary Care

## 2019-04-11 ENCOUNTER — Ambulatory Visit (INDEPENDENT_AMBULATORY_CARE_PROVIDER_SITE_OTHER): Payer: Self-pay | Admitting: Primary Care

## 2019-04-11 ENCOUNTER — Other Ambulatory Visit: Payer: Self-pay

## 2019-04-11 ENCOUNTER — Encounter (INDEPENDENT_AMBULATORY_CARE_PROVIDER_SITE_OTHER): Payer: Self-pay | Admitting: Primary Care

## 2019-04-11 VITALS — BP 117/80 | HR 74 | Temp 97.1°F | Ht 61.0 in | Wt 196.6 lb

## 2019-04-11 DIAGNOSIS — Z3202 Encounter for pregnancy test, result negative: Secondary | ICD-10-CM

## 2019-04-11 DIAGNOSIS — N925 Other specified irregular menstruation: Secondary | ICD-10-CM

## 2019-04-11 DIAGNOSIS — N912 Amenorrhea, unspecified: Secondary | ICD-10-CM

## 2019-04-11 DIAGNOSIS — R350 Frequency of micturition: Secondary | ICD-10-CM

## 2019-04-11 LAB — POCT URINE PREGNANCY: Preg Test, Ur: NEGATIVE

## 2019-04-11 NOTE — Progress Notes (Signed)
Established Patient Office Visit  Subjective:  Patient ID: Sharmaine BaseBethany Carrol, female    DOB: 1990-10-18  Age: 28 y.o. MRN: [id number]  CC:  Chief Complaint  Patient presents with  . Amenorrhea    HPI   Sharmaine BaseBethany Mathieu presents for confirmation that she is not pregnant. She has had 4 negative pregnancy tests. She has not had her period since August 5th. Prior to this her cycle was regular.   Past Medical History:  Diagnosis Date  . Abnormal vaginal Pap smear   . Acute systolic HF (heart failure) (HCC) 08/28/2018  . Anticoagulation goal of INR 2 to 3 09/03/2018  . Anxiety   . Asthma    HX - rarely uses inhaler - seasonal  . Bipolar 1 disorder (HCC)   . Depression   . History of depression   . History of trichomoniasis   . Hypertension   . 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 Pneumonia    CHILDHOOD  . 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 ARTHROSCOPTIC SHOULDER Left 2007  . CERVICAL CONIZATION W/BX N/A 12/14/2016   Procedure: COLD KNIFE CONIZATION, REMOVAL OF BODY JEWELRY;  Surgeon: Bethune BingPickens, Charlie, MD;  Location: [facility];  Service: Gynecology;  Laterality: N/A;  . RIGHT/LEFT HEART CATH AND CORONARY ANGIOGRAPHY N/A 08/30/2018   Procedure: RIGHT/LEFT HEART CATH AND CORONARY ANGIOGRAPHY;  Surgeon: Laurey MoraleMcLean, Dalton S, MD;  [facility];  Service: Cardiovascular;  Laterality: N/A;    Family History  Problem Relation Age of Onset  . Hypertension Mother   . Hypertension Father   . Diabetes Maternal Grandmother   . Hypertension Maternal Grandfather     Social History   Socioeconomic History  . Marital status: Single    Spouse name: Not on file  . Number of children: Not on file  . Years of education: Not on file  . Highest education level: Not on file  Occupational History  . Not on file  Social Needs  . Financial resource strain: Not on file  . Food  insecurity    Worry: Not on file    Inability: Not on file  . Transportation needs    Medical: Not on file    Non-medical: Not on file  Tobacco Use  . Smoking status: Never Smoker  . Smokeless tobacco: Never Used  Substance and Sexual Activity  . Alcohol use: Yes    Comment: socially wine  . Drug use: No  . Sexual activity: Yes    Birth control/protection: Condom  Lifestyle  . Physical activity    Days per week: Not on file    Minutes per session: Not on file  . Stress: Not on file  Relationships  . Social Musicianconnections    Talks on phone: Not on file    Gets together: Not on file    Attends religious service: Not on file    Active member of club or organization: Not on file    Attends meetings of clubs or organizations: Not on file    Relationship status: Not on file  . Intimate partner violence    Fear of current or ex partner: Not on file    Emotionally abused: Not on file    Physically abused: Not on file    Forced sexual activity: Not on file  Other Topics Concern  . Not on file  Social History Narrative  . Not on file  Outpatient Medications Prior to Visit  Medication Sig Dispense Refill  . albuterol (PROVENTIL HFA;VENTOLIN HFA) 108 (90 Base) MCG/ACT inhaler Inhale 2 puffs into the lungs every 6 (six) hours as needed for wheezing or shortness of breath.    Marland Kitchen atorvastatin (LIPITOR) 80 MG tablet Take 1 tablet (80 mg total) by mouth daily at 6 PM. 30 tablet 6  . carvedilol (COREG) 6.25 MG tablet Take 1 tablet (6.25 mg total) by mouth 2 (two) times daily with a meal. 180 tablet 6  . loratadine (CLARITIN) 10 MG tablet Take 10 mg by mouth daily as needed for allergies.    . potassium chloride SA (K-DUR,KLOR-CON) 20 MEQ tablet Take 1 tablet (20 mEq total) by mouth daily. With every LASIX dose 90 tablet 3  . sacubitril-valsartan (ENTRESTO) 24-26 MG Take 1 tablet by mouth 2 (two) times daily. 180 tablet 3  . spironolactone (ALDACTONE) 25 MG tablet Take 1 tablet (25 mg  total) by mouth at bedtime. 30 tablet 5  . warfarin (COUMADIN) 5 MG tablet Take 1.5 tablets (7.5 mg total) by mouth daily at 6 PM. 45 tablet 5  . furosemide (LASIX) 20 MG tablet Take 1 tablet (20 mg total) by mouth as needed. 90 tablet 3  . losartan (COZAAR) 25 MG tablet Take 1 tablet (25 mg total) by mouth daily. 30 tablet 3   No facility-administered medications prior to visit.     Allergies  Allergen Reactions  . Other Shortness Of Breath    Environmental allergies  . Gluten Meal     Gluten makes her wheeze    ROS Review of Systems  Genitourinary: Positive for frequency and menstrual problem.  All other systems reviewed and are negative.     Objective:    Physical Exam  Constitutional: She appears well-developed and well-nourished.  HENT:  Head: Normocephalic.  Neck: Normal range of motion. Neck supple.  Cardiovascular: Normal rate and regular rhythm.  Pulmonary/Chest: Effort normal and breath sounds normal.  Abdominal: Soft. Bowel sounds are normal. She exhibits distension.  Musculoskeletal: Normal range of motion.  Skin: Skin is warm and dry.    BP 117/80 (BP Location: Right Arm, Patient Position: Sitting, Cuff Size: Large)   Pulse 74   Temp (!) 97.1 F (36.2 C) (Temporal)   Ht 5\' 1"  (1.549 m)   Wt 196 lb 9.6 oz (89.2 kg)   LMP 02/07/2019 (Exact Date)   SpO2 98%   BMI 37.15 kg/m  Wt Readings from Last 3 Encounters:  04/11/19 196 lb 9.6 oz (89.2 kg)  02/22/19 199 lb 9.6 oz (90.5 kg)  10/23/18 199 lb (90.3 kg)     Health Maintenance Due  Topic Date Due  . TETANUS/TDAP  11/26/2007  . INFLUENZA VACCINE  02/03/2019  . PAP SMEAR-Modifier  04/08/2019    There are no preventive care reminders to display for this patient.  Lab Results  Component Value Date   TSH 2.144 08/29/2018   Lab Results  Component Value Date   WBC 4.7 12/26/2018   HGB 11.4 (L) 12/26/2018   HCT 35.7 (L) 12/26/2018   MCV 87.3 12/26/2018   PLT 249 12/26/2018   Lab Results   Component Value Date   NA 140 02/22/2019   K 3.4 (L) 02/22/2019   CO2 24 02/22/2019   GLUCOSE 92 02/22/2019   BUN 9 02/22/2019   CREATININE 0.82 02/22/2019   BILITOT 0.7 08/28/2018   ALKPHOS 33 (L) 08/28/2018   AST 15 08/28/2018   ALT 16 08/28/2018  PROT 7.2 08/28/2018   ALBUMIN 3.1 (L) 08/28/2018   CALCIUM 8.8 (L) 02/22/2019   ANIONGAP 8 02/22/2019   No results found for: CHOL No results found for: HDL No results found for: LDLCALC No results found for: TRIG No results found for: CHOLHDL No results found for: HGBA1C    Assessment & Plan:   Problem List Items Addressed This Visit    None    Visit Diagnoses    Amenorrhea    -  Primary   Relevant Orders   POCT urine pregnancy   Urinary frequency        Kamrin was seen today for amenorrhea.  Diagnoses and all orders for this visit:  Amenorrhea No cycle since August 4 negative test will order HCG for conformation  -     POCT urine pregnancy  Urinary Frequency  Due to chronic systolic congested heart failure and diuretic probable cause she denied any burning or pain   No orders of the defined types were placed in this encounter.   Follow-up: Return for PRN.    Kerin Perna, NP

## 2019-04-11 NOTE — Progress Notes (Signed)
Pt has taken 4 at home pregnancy test and all were negative.

## 2019-04-11 NOTE — Patient Instructions (Signed)
Primary Amenorrhea  Primary amenorrhea is when a female has not started having periods by the age of 55 years. What are the causes? This condition may be caused by:  An abnormal chromosome that causes the ovaries not to work properly (common).  Malnutrition.  Low blood sugar (hypoglycemia).  Polycystic ovary syndrome.  Being born without a vagina, uterus, or ovaries.  Extreme obesity.  Cystic fibrosis.  Drastic weight loss.  Over-exercising that leads to a loss of body fat.  Pituitary gland tumor in the brain.  Long-term illnesses.  Cushing disease.  A thyroid disease, such as hypothyroidism or hyperthyroidism.  A part of the brain called the hypothalamus not working normally.  Premature ovarian failure. What are the signs or symptoms? The main symptom of this condition is not having had a period by age 60 years. Other symptoms include:  Discharge from the breasts.  Hot flashes.  Adult acne.  Facial or chest hair.  Headaches.  Impaired vision.  Recent excessive stress.  Changes in weight, diet, or exercise patterns. How is this diagnosed? This condition may be diagnosed based on:  Your medical history.  A physical exam.  Tests, such as: ? Blood tests. ? Urine tests. How is this treated? Treatment for this condition depends on the cause. For example, an absence of sex organs will require surgery to correct the problem. Others causes may respond when treated with medicine. Follow these instructions at home:  Maintain a healthy diet.  Maintain a healthy weight.  Exercise regularly but not excessively.  Take over-the-counter and prescription medicines only as told by your health care provider.  Keep all follow-up visits as told by your health care provider. This is important. Contact a health care provider if:  Your body is not developing at the level of your peers.  You have pelvic pain.  You gain an unusual amount of weight.  You have  an unusual amount of hair growth. Summary  Primary amenorrhea is when a female has not started having periods by the age of 89 years.  This condition has many causes, including abnormal ovaries, obesity, extreme weight loss.  Contact a health care provider if your body is not developing at the level of your peers. This information is not intended to replace advice given to you by your health care provider. Make sure you discuss any questions you have with your health care provider. Document Released: 06/21/2005 Document Revised: 12/01/2017 Document Reviewed: 09/07/2016 Elsevier Patient Education  [place].

## 2019-04-12 LAB — BETA HCG QUANT (REF LAB): hCG Quant: <1 m[IU]/mL

## 2019-04-17 ENCOUNTER — Other Ambulatory Visit: Payer: Self-pay

## 2019-04-17 ENCOUNTER — Ambulatory Visit (INDEPENDENT_AMBULATORY_CARE_PROVIDER_SITE_OTHER): Payer: Self-pay | Admitting: *Deleted

## 2019-04-17 DIAGNOSIS — I236 Thrombosis of atrium, auricular appendage, and ventricle as current complications following acute myocardial infarction: Secondary | ICD-10-CM

## 2019-04-17 DIAGNOSIS — Z5181 Encounter for therapeutic drug level monitoring: Secondary | ICD-10-CM

## 2019-04-17 LAB — POCT INR: INR: 2.3 (ref 2.0–3.0)

## 2019-04-17 NOTE — Patient Instructions (Signed)
Description   Continue taking 2 tablets daily except 1 tablet on Sundays, Tuesdays and Thursdays.  Recheck INR in 3 weeks. Please call us with any questions or new medications. Coumadin Clinic [phone] Main [phone]

## 2019-04-19 MED FILL — CARVEDILOL 6.25 MG TABLET: 6.25 | 30 days supply | Qty: 60 | Fill #3

## 2019-05-21 ENCOUNTER — Ambulatory Visit (INDEPENDENT_AMBULATORY_CARE_PROVIDER_SITE_OTHER): Payer: Self-pay | Admitting: *Deleted

## 2019-05-21 ENCOUNTER — Other Ambulatory Visit: Payer: Self-pay

## 2019-05-21 DIAGNOSIS — Z5181 Encounter for therapeutic drug level monitoring: Secondary | ICD-10-CM

## 2019-05-21 DIAGNOSIS — I236 Thrombosis of atrium, auricular appendage, and ventricle as current complications following acute myocardial infarction: Secondary | ICD-10-CM

## 2019-05-21 LAB — POCT INR: INR: 1.5 — AB (ref 2.0–3.0)

## 2019-05-21 NOTE — Patient Instructions (Addendum)
Description   Take an extra 1/2 tablet today and tomorrow, then continue taking 2 tablets daily except 1 tablet on Sundays, Tuesdays and Thursdays.  Recheck INR in 2 weeks. Please call us with any questions or new medications. [facility]in Clinic [phone] Main [phone]

## 2019-06-04 ENCOUNTER — Other Ambulatory Visit: Payer: Self-pay

## 2019-06-04 ENCOUNTER — Ambulatory Visit (INDEPENDENT_AMBULATORY_CARE_PROVIDER_SITE_OTHER): Payer: Self-pay | Admitting: *Deleted

## 2019-06-04 DIAGNOSIS — Z5181 Encounter for therapeutic drug level monitoring: Secondary | ICD-10-CM

## 2019-06-04 DIAGNOSIS — I236 Thrombosis of atrium, auricular appendage, and ventricle as current complications following acute myocardial infarction: Secondary | ICD-10-CM

## 2019-06-04 LAB — POCT INR: INR: 1.5 — AB (ref 2.0–3.0)

## 2019-06-04 NOTE — Patient Instructions (Signed)
Description   Take an extra 1/2 tablet today then start  taking 2 tablets daily except 1 tablet on Sundays and Thursdays.  Recheck INR in 1 week.  Please call us with any questions or new medications. Coumadin Clinic [phone] Main [phone]

## 2019-07-13 MED FILL — CARVEDILOL 6.25 MG TABLET: 6.25 | 30 days supply | Qty: 60 | Fill #4

## 2019-07-26 ENCOUNTER — Telehealth: Payer: Self-pay | Admitting: *Deleted

## 2019-07-26 NOTE — Telephone Encounter (Signed)
Pt is overdue for INR follow-up. She was last seen on 06/04/2019. Called pt and unable to leave a message as the voicemail is full.

## 2019-08-06 ENCOUNTER — Ambulatory Visit (INDEPENDENT_AMBULATORY_CARE_PROVIDER_SITE_OTHER): Payer: Self-pay | Admitting: *Deleted

## 2019-08-06 ENCOUNTER — Other Ambulatory Visit: Payer: Self-pay

## 2019-08-06 DIAGNOSIS — Z5181 Encounter for therapeutic drug level monitoring: Secondary | ICD-10-CM

## 2019-08-06 DIAGNOSIS — I236 Thrombosis of atrium, auricular appendage, and ventricle as current complications following acute myocardial infarction: Secondary | ICD-10-CM

## 2019-08-06 LAB — POCT INR: INR: 2.6 (ref 2.0–3.0)

## 2019-08-06 NOTE — Patient Instructions (Addendum)
Description   Start taking 2 tablets daily except 1 tablet on Thursdays.  Recheck INR in 1 week.   Please call us with any questions or new medications. Coumadin Clinic [phone] Main [phone]

## 2019-08-07 ENCOUNTER — Other Ambulatory Visit: Payer: Self-pay | Admitting: Cardiology

## 2019-09-10 MED FILL — CARVEDILOL 6.25 MG TABLET: 6.25 | 30 days supply | Qty: 60 | Fill #5

## 2019-09-11 ENCOUNTER — Telehealth (HOSPITAL_COMMUNITY): Payer: Self-pay | Admitting: Cardiology

## 2019-09-11 NOTE — Telephone Encounter (Signed)
Patient left message on triage line with a request for medication refills Medications details were not left on message  Detailed message left for patient to return call with medication information or contact pharmacy directly and pharmacy will contact us

## 2019-09-14 ENCOUNTER — Other Ambulatory Visit (HOSPITAL_COMMUNITY): Payer: Self-pay | Admitting: Cardiology

## 2019-09-14 MED ORDER — CARVEDILOL 6.25 MG PO TABS: 6.2500 mg | ORAL_TABLET | Freq: Two times a day (BID) | ORAL | 1 refills | Status: DC

## 2019-09-14 MED ORDER — SPIRONOLACTONE 25 MG PO TABS: 25.0000 mg | ORAL_TABLET | Freq: Every day | ORAL | 1 refills | Status: DC

## 2019-09-14 MED ORDER — ATORVASTATIN CALCIUM 80 MG PO TABS: 80.0000 mg | ORAL_TABLET | Freq: Every day | ORAL | 6 refills | Status: DC

## 2019-09-14 MED ORDER — WARFARIN SODIUM 5 MG PO TABS: ORAL_TABLET | ORAL | 0 refills | Status: DC

## 2019-09-14 NOTE — Telephone Encounter (Signed)
Patient returned call and medications addressed Routine follow up scheduled as well

## 2019-09-27 ENCOUNTER — Ambulatory Visit: Payer: Medicaid Other | Attending: Internal Medicine

## 2019-09-27 DIAGNOSIS — Z23 Encounter for immunization: Secondary | ICD-10-CM

## 2019-09-27 NOTE — Progress Notes (Signed)
   Covid-19 Vaccination Clinic  Name:  Sheila Estes    MRN: [id number] DOB: 10/28/1990  09/27/2019  [name] was observed post Covid-19 immunization for 15 minutes without incident. She was provided with Vaccine Information Sheet and instruction to access the V-Safe system.   [name] was instructed to call 911 with any severe reactions post vaccine: Marland Kitchen Difficulty breathing  . Swelling of face and throat  . A fast heartbeat  . A bad rash all over body  . Dizziness and weakness   Immunizations Administered    Name Date Dose VIS Date Route   Pfizer COVID-19 Vaccine 09/27/2019 10:03 AM 0.3 mL 06/15/2019 Intramuscular   Manufacturer: [employer], [employer]   Lot: [id number]   NDC: [address]-5

## 2019-10-04 ENCOUNTER — Encounter (HOSPITAL_COMMUNITY): Payer: Self-pay

## 2019-10-04 ENCOUNTER — Encounter (HOSPITAL_COMMUNITY): Payer: Medicaid Other

## 2019-10-18 ENCOUNTER — Other Ambulatory Visit: Payer: Self-pay

## 2019-10-18 ENCOUNTER — Ambulatory Visit (INDEPENDENT_AMBULATORY_CARE_PROVIDER_SITE_OTHER): Payer: Self-pay | Admitting: *Deleted

## 2019-10-18 DIAGNOSIS — Z5181 Encounter for therapeutic drug level monitoring: Secondary | ICD-10-CM

## 2019-10-18 LAB — POCT INR: INR: 1.4 — AB (ref 2.0–3.0)

## 2019-10-18 NOTE — Patient Instructions (Signed)
Description   Take 2 tablets today and the continue taking 2 tablets daily except 1 tablet on Thursdays.  Recheck INR in 1 week.   Please call us with any questions or new medications. Coumadin Clinic [phone] Main [phone]

## 2019-10-23 ENCOUNTER — Ambulatory Visit: Payer: Medicaid Other | Attending: Internal Medicine

## 2019-10-23 DIAGNOSIS — Z23 Encounter for immunization: Secondary | ICD-10-CM

## 2019-10-23 NOTE — Progress Notes (Signed)
   Covid-19 Vaccination Clinic  Name:  Sheila Estes    MRN: [id number] DOB: [birth year]  10/23/2019  [name] was observed post Covid-19 immunization for 15 minutes without incident. She was provided with Vaccine Information Sheet and instruction to access the V-Safe system.   [name] was instructed to call 911 with any severe reactions post vaccine: Marland Kitchen Difficulty breathing  . Swelling of face and throat  . A fast heartbeat  . A bad rash all over body  . Dizziness and weakness   Immunizations Administered    Name Date Dose VIS Date Route   Pfizer COVID-19 Vaccine 10/23/2019  8:55 AM 0.3 mL 08/29/2018 Intramuscular   Manufacturer: [employer], [employer]   Lot: [id number]   NDC: [address]-3

## 2019-10-26 ENCOUNTER — Other Ambulatory Visit: Payer: Self-pay

## 2019-10-26 ENCOUNTER — Ambulatory Visit (INDEPENDENT_AMBULATORY_CARE_PROVIDER_SITE_OTHER): Payer: Self-pay | Admitting: *Deleted

## 2019-10-26 DIAGNOSIS — Z5181 Encounter for therapeutic drug level monitoring: Secondary | ICD-10-CM

## 2019-10-26 LAB — POCT INR: INR: 4.3 — AB (ref 2.0–3.0)

## 2019-10-26 NOTE — Patient Instructions (Signed)
Description   Hold today and take 1 tablet tomorrow then start taking 2 tablets daily except for 1 tablet on Tuesdays and Thursdays.   Please call us with any questions or new medications. Coumadin Clinic [phone] Main [phone]

## 2019-11-01 ENCOUNTER — Ambulatory Visit (INDEPENDENT_AMBULATORY_CARE_PROVIDER_SITE_OTHER): Payer: Self-pay | Admitting: *Deleted

## 2019-11-01 ENCOUNTER — Encounter (HOSPITAL_COMMUNITY): Payer: Self-pay

## 2019-11-01 ENCOUNTER — Other Ambulatory Visit: Payer: Self-pay

## 2019-11-01 ENCOUNTER — Ambulatory Visit (HOSPITAL_COMMUNITY)
Admission: RE | Admit: 2019-11-01 | Discharge: 2019-11-01 | Disposition: A | Discharge status: Home / Self Care | Payer: Self-pay | Source: Ambulatory Visit | Attending: Cardiology | Admitting: Cardiology

## 2019-11-01 VITALS — BP 122/74 | HR 70 | Wt 198.2 lb

## 2019-11-01 DIAGNOSIS — Z5181 Encounter for therapeutic drug level monitoring: Secondary | ICD-10-CM

## 2019-11-01 DIAGNOSIS — Z8249 Family history of ischemic heart disease and other diseases of the circulatory system: Secondary | ICD-10-CM | POA: Insufficient documentation

## 2019-11-01 DIAGNOSIS — I2510 Atherosclerotic heart disease of native coronary artery without angina pectoris: Secondary | ICD-10-CM | POA: Insufficient documentation

## 2019-11-01 DIAGNOSIS — Z79899 Other long term (current) drug therapy: Secondary | ICD-10-CM | POA: Insufficient documentation

## 2019-11-01 DIAGNOSIS — E669 Obesity, unspecified: Secondary | ICD-10-CM | POA: Insufficient documentation

## 2019-11-01 DIAGNOSIS — I5022 Chronic systolic (congestive) heart failure: Secondary | ICD-10-CM | POA: Insufficient documentation

## 2019-11-01 DIAGNOSIS — I255 Ischemic cardiomyopathy: Secondary | ICD-10-CM | POA: Insufficient documentation

## 2019-11-01 DIAGNOSIS — J45909 Unspecified asthma, uncomplicated: Secondary | ICD-10-CM | POA: Insufficient documentation

## 2019-11-01 DIAGNOSIS — I236 Thrombosis of atrium, auricular appendage, and ventricle as current complications following acute myocardial infarction: Secondary | ICD-10-CM

## 2019-11-01 DIAGNOSIS — Z7901 Long term (current) use of anticoagulants: Secondary | ICD-10-CM | POA: Insufficient documentation

## 2019-11-01 LAB — BASIC METABOLIC PANEL
Anion gap: 10 (ref 5–15)
BUN: 15 mg/dL (ref 6–20)
CO2: 24 mmol/L (ref 22–32)
Calcium: 9.6 mg/dL (ref 8.9–10.3)
Chloride: 104 mmol/L (ref 98–111)
Creatinine, Ser: 0.73 mg/dL (ref 0.44–1.00)
GFR calc Af Amer: >60 mL/min (ref >60)
GFR calc non Af Amer: >60 mL/min (ref >60)
Glucose, Bld: 96 mg/dL (ref 70–99)
Potassium: 3.7 mmol/L (ref 3.5–5.1)
Sodium: 138 mmol/L (ref 135–145)

## 2019-11-01 LAB — BRAIN NATRIURETIC PEPTIDE: B Natriuretic Peptide: 1101.3 pg/mL — ABNORMAL HIGH (ref 0.0–100.0)

## 2019-11-01 LAB — POCT INR: INR: 3.4 — AB (ref 2.0–3.0)

## 2019-11-01 MED ORDER — ENTRESTO 49-51 MG PO TABS: 1.0000 | ORAL_TABLET | Freq: Two times a day (BID) | ORAL | 6 refills | Status: DC

## 2019-11-01 NOTE — Patient Instructions (Signed)
Description   Hold today's dose then start taking 2 tablets daily except for 1 tablet on Sundays and Thursdays. Recheck in 2 weeks. Please call us with any questions or new medications. Coumadin Clinic [phone] Main [phone]

## 2019-11-01 NOTE — Progress Notes (Signed)
Date:  11/01/2019   ID:  Sheila Estes, DOB 1990-11-10, MRN [id number]  Location: Home  Provider location: [place] [facility] Type of Visit: Established patient   PCP:  Grayce Sessions, NP  Cardiologist:  Dr. Shirlee Latch  Chief Complaint: F/u for Chronic Systolic Heart Failure    History of Present Illness: Sheila Estes is a 29 year old with a history of history of obesity, anxiety, asthma, bipolar disorder,  Depression, and CAD, chronic systolic heart failure.  Admitted 2/23-09/04/18 with pulmonary edema. She had been short of breath for about a month, which started after drinking 2 Red Bulls. She had been seen at Urgent Care and [facility] and was treated for asthma and for PNA. Echo at 2/20 admission showed newly reduced EF 25-30% with peri-apical akinesis and apical thrombus. Transferred to [facility] and had LHC/RHC, which showed normal filling pressures and preserved cardiac output. The proximal LAD was subtotally occluded, it looked like a SCAD event. Cardiac MRI suggested that LAD territory was infarcted and unlikely to be viable with restoration of perfusion.HF medications were optimized and she was set up with LifeVest for discharge. She was started on coumadin for LV thrombus. DC weight: 198 lbs.  Last seen in the [facility] 02/2019. At that time she had self discontinued her Lifevest and had returned it. Echo was repeated at visit and EF remained severely reduced at 20-25% and RV was moderately reduced.   She presents back for f/u. Feels fairly decent. Reports improved compliance w/ meds. She tries to avoid high sodium foods but admits to eating out at least once, sometimes twice a week (more convenient w/ 46 year old child). She can tell if she gets too much salt in diet, often feels bloated w/ abdominal distention. She has changed from taking lasix PRN to daily and this helps. She lives in a 3 floor apartment and can go up stairs w/o much dyspnea, she does however endorse bendopnea. No  orthopnea/ PND.   We discussed importance of avoiding pregnancy. She is not on OCPs. She uses condoms.    Labs (3/20): K 3.9, creatinine 0.79  PMH: 1. Depression 2. Anxiety 3. Asthma 4. Chronic systolic CHF: Ischemic cardiomyopathy.   - Echo (2/20) with EF 25-30%, peri-apical akinesis, LV thrombus.  - LHC/RHC (2/20): Subtotal LAD occlusion after S1.  Appearance of LAD suggests SCAD.  Mean RA 1, PA 36/8 mean 23, mean PCWP 12, CI 2.46.  - Cardiac MRI (2/20): Mildly dilated LV with EF 27%, delayed enhancement images suggest a dense LAD-territory infarct (myocardium unlikely to be viable), LV apical thrombus, RV EF 31%.  5. CAD: LHC (2/20) with subtotal LAD occlusion after S1.  Appearance of LAD suggests SCAD.  Current Outpatient Medications  Medication Sig Dispense Refill  . albuterol (PROVENTIL HFA;VENTOLIN HFA) 108 (90 Estes) MCG/ACT inhaler Inhale 2 puffs into the lungs every 6 (six) hours as needed for wheezing or shortness of breath.    Marland Kitchen atorvastatin (LIPITOR) 80 MG tablet Take 1 tablet (80 mg total) by mouth daily at 6 PM. 30 tablet 6  . carvedilol (COREG) 6.25 MG tablet Take 1 tablet (6.25 mg total) by mouth 2 (two) times daily with a meal. 60 tablet 1  . furosemide (LASIX) 20 MG tablet Take 1 tablet (20 mg total) by mouth as needed. 90 tablet 3  . loratadine (CLARITIN) 10 MG tablet Take 10 mg by mouth daily as needed for allergies.    . potassium chloride SA (  K-DUR,KLOR-CON) 20 MEQ tablet Take 1 tablet (20 mEq total) by mouth daily. With every LASIX dose 90 tablet 3  . sacubitril-valsartan (ENTRESTO) 24-26 MG Take 1 tablet by mouth 2 (two) times daily. 180 tablet 3  . spironolactone (ALDACTONE) 25 MG tablet Take 1 tablet (25 mg total) by mouth at bedtime. 30 tablet 1  . warfarin (COUMADIN) 5 MG tablet TAKE 1 TABLET BY MOUTH DAILY AT 6PM 60 tablet 0   No current facility-administered medications for this encounter.    Allergies:   Other and Gluten meal   Social History:  The  patient  reports that she has never smoked. She has never used smokeless tobacco. She reports current alcohol use. She reports that she does not use drugs.   Family History:  The patient's family history includes Diabetes in her maternal grandmother; Hypertension in her father, maternal grandfather, and mother.   ROS:  Please see the history of present illness.   All other systems are personally reviewed and negative.  Vitals:   11/01/19 0906  BP: 122/74  Pulse: 70  SpO2: 98%   PHYSICAL EXAM: General:  Well appearing young AAM. No respiratory difficulty HEENT: normal Neck: supple. JVD elevated to jawline. Carotids 2+ bilat; no bruits. No lymphadenopathy or thyromegaly appreciated. Cor: PMI nondisplaced. Regular rate & rhythm. No rubs, gallops or murmurs. Lungs: clear Abdomen: mildly obese, soft, nontender, nondistended. No hepatosplenomegaly. No bruits or masses. Good bowel sounds. Extremities: no cyanosis, clubbing, rash, edema Neuro: alert & oriented x 3, cranial nerves grossly intact. moves all 4 extremities w/o difficulty. Affect pleasant.   Recent Labs: 12/26/2018: Hemoglobin 11.4; Platelets 249 02/22/2019: BUN 9; Creatinine, Ser 0.82; Potassium 3.4; Sodium 140     Wt Readings from Last 3 Encounters:  11/01/19 89.9 kg (198 lb 3.2 oz)  04/11/19 89.2 kg (196 lb 9.6 oz)  02/22/19 90.5 kg (199 lb 9.6 oz)      ASSESSMENT AND PLAN:  1. 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  RHC in 2/20 showed normal filling pressures and preserved cardiac output.  Cardiac MRI in 2/20 suggested that LAD territory was infarcted and unlikely to be viable with restoration of perfusion. Repeat Echo 02/2019, EF 20-25% RV was moderately reduced  - she is NYHA Class II and appears mildly volume overloaded on exam and recently changed lasix from PRN to daily - Will further increase Entresto  to 49-51 bid  - Continue Lasix 20 mg daily  - Continue Spironolactone 25 mg daily  - Continue Coreg 6.25 mg bid - Check BMP today  - Will need to continue to titrate HF meds until optimized.  - Try to add a SGLT2i next and titrate Entresto to 97-103 bid - future addition of Bidil if BP can tolerate  - We discussed again today the need to avoid pregnancy and to follow up with GYN for options. Currently using condoms. She understands she need to contact HF clinic if she is gets pregnant so we adjust HF medications.  - As above, LAD territory is unlikely to be viable based on MRI, so would not pursue CTO procedure or CABG.   2. H/o LV thrombus: On warfarin.  - INR followed at coumadin clinic. Denies abnormal bleeding    3. CAD - LHC 08/2018 showed occlusion of prox LAD, appears to be a SCAD event - denies ischemic CP - On high dose statin and  coumadin.   - LAD territory is unlikely to be viable based on MRI, so would not pursue CTO procedure or CABG.   F/u every 2-3 weeks for gradual med titration w/ pharmD or APP. Once optimized, will repeat another echo in ~3 months. May need to consider ICD if EF remains < 35%, if she is willing.      Signed, Robbie Lis, PA-C  11/01/2019 9:17 AM  [facility] [address] Heart and Vascular [place] [address] [phone] (office) [phone] (fax)

## 2019-11-01 NOTE — Patient Instructions (Signed)
INCREASE Entresto to 49/51 mg, one tab twice daily  Labs today We will only contact you if something comes back abnormal or we need to make some changes. Otherwise no news is good news!  Your physician recommends that you schedule a follow-up appointment in: 3-4 week with Posey Boyer D Your physician recommends that you schedule a follow-up appointment in: 6-8 weeks  in the Advanced Practitioners (PA/NP) Clinic    Do the following things EVERYDAY: 1) Weigh yourself in the morning before breakfast. Write it down and keep it in a log. 2) Take your medicines as prescribed 3) Eat low salt foods--Limit salt (sodium) to 2000 mg per day.  4) Stay as active as you can everyday 5) Limit all fluids for the day to less than 2 liters  At the [facility] Clinic, you and your health needs are our priority. As part of our continuing mission to provide you with exceptional heart care, we have created designated Provider Care Teams. These Care Teams include your primary Cardiologist (physician) and Advanced Practice Providers (APPs- Physician Assistants and Nurse Practitioners) who all work together to provide you with the care you need, when you need it.   You may see any of the following providers on your designated Care Team at your next follow up: Marland Kitchen Dr Arvilla Meres . Dr Marca Ancona . Tonye Becket, NP . Robbie Lis, PA . Karle Plumber, PharmD   Please be sure to bring in all your medications bottles to every appointment.

## 2019-11-19 ENCOUNTER — Ambulatory Visit (INDEPENDENT_AMBULATORY_CARE_PROVIDER_SITE_OTHER): Payer: Self-pay | Admitting: *Deleted

## 2019-11-19 ENCOUNTER — Other Ambulatory Visit: Payer: Self-pay

## 2019-11-19 DIAGNOSIS — Z5181 Encounter for therapeutic drug level monitoring: Secondary | ICD-10-CM

## 2019-11-19 LAB — POCT INR: INR: 1.2 — AB (ref 2.0–3.0)

## 2019-11-19 NOTE — Patient Instructions (Signed)
Description   Take 3 tablets today and then continue taking 2 tablets daily except for 1 tablet on Sundays and Thursdays. Recheck in 1 week. Please call us with any questions or new medications. Coumadin Clinic [phone] Main [phone]

## 2019-11-23 ENCOUNTER — Other Ambulatory Visit (HOSPITAL_COMMUNITY): Payer: Self-pay

## 2019-11-23 MED ORDER — CARVEDILOL 6.25 MG PO TABS: 6.2500 mg | ORAL_TABLET | Freq: Two times a day (BID) | ORAL | 3 refills | Status: DC

## 2019-11-23 MED FILL — CARVEDILOL 6.25 MG TABLET: 6.25 | 90 days supply | Qty: 180 | Fill #0

## 2019-11-29 ENCOUNTER — Other Ambulatory Visit: Payer: Self-pay

## 2019-11-29 ENCOUNTER — Ambulatory Visit (HOSPITAL_COMMUNITY)
Admission: RE | Admit: 2019-11-29 | Discharge: 2019-11-29 | Disposition: A | Discharge status: Home / Self Care | Payer: Medicaid Other | Source: Ambulatory Visit | Attending: Internal Medicine | Admitting: Internal Medicine

## 2019-11-29 DIAGNOSIS — I5022 Chronic systolic (congestive) heart failure: Secondary | ICD-10-CM

## 2019-11-29 DIAGNOSIS — I255 Ischemic cardiomyopathy: Secondary | ICD-10-CM | POA: Insufficient documentation

## 2019-11-29 DIAGNOSIS — I252 Old myocardial infarction: Secondary | ICD-10-CM | POA: Insufficient documentation

## 2019-11-29 DIAGNOSIS — Z86718 Personal history of other venous thrombosis and embolism: Secondary | ICD-10-CM | POA: Insufficient documentation

## 2019-11-29 DIAGNOSIS — I2510 Atherosclerotic heart disease of native coronary artery without angina pectoris: Secondary | ICD-10-CM | POA: Insufficient documentation

## 2019-11-29 DIAGNOSIS — Z79899 Other long term (current) drug therapy: Secondary | ICD-10-CM | POA: Insufficient documentation

## 2019-11-29 DIAGNOSIS — E669 Obesity, unspecified: Secondary | ICD-10-CM | POA: Insufficient documentation

## 2019-11-29 DIAGNOSIS — Z7901 Long term (current) use of anticoagulants: Secondary | ICD-10-CM | POA: Insufficient documentation

## 2019-11-29 LAB — BASIC METABOLIC PANEL
Anion gap: 10 (ref 5–15)
BUN: 13 mg/dL (ref 6–20)
CO2: 26 mmol/L (ref 22–32)
Calcium: 9 mg/dL (ref 8.9–10.3)
Chloride: 104 mmol/L (ref 98–111)
Creatinine, Ser: 0.74 mg/dL (ref 0.44–1.00)
GFR calc Af Amer: >60 mL/min (ref >60)
GFR calc non Af Amer: >60 mL/min (ref >60)
Glucose, Bld: 106 mg/dL — ABNORMAL HIGH (ref 70–99)
Potassium: 3.8 mmol/L (ref 3.5–5.1)
Sodium: 140 mmol/L (ref 135–145)

## 2019-11-29 LAB — BRAIN NATRIURETIC PEPTIDE: B Natriuretic Peptide: 1013.7 pg/mL — ABNORMAL HIGH (ref 0.0–100.0)

## 2019-11-29 MED ORDER — ENTRESTO 49-51 MG PO TABS: 1.0000 | ORAL_TABLET | Freq: Two times a day (BID) | ORAL | 6 refills | Status: DC

## 2019-11-29 NOTE — Progress Notes (Signed)
PCP:  Kerin Perna, NP          Cardiologist:  Dr. Aundra Dubin  HPI:  Ms Bashor is a 29 year old with a history of history of obesity, anxiety, asthma, bipolar disorder,  depression, and CAD, chronic systolic heart failure.  Admitted 2/23-09/04/18 with pulmonary edema. She had been short of breath for about a month, which started after drinking 2 Red Bulls. She had been seen at [facility]gent [facility] and [facility] and was treated for asthma and for PNA. Echo at 2/20 admission showed newly reducedEF 25-30% with peri-apical akinesis and apical thrombus.[facility] and hadLHC/RHC, whichshowed normal filling pressures and preserved cardiac output. The proximal LAD was subtotally occluded, it looked like a SCAD event. Cardiac MRI suggested that LAD territory was infarcted and unlikely to be viable with restoration of perfusion.HF medications were optimized and she was set up with LifeVest for discharge. She was started on coumadin for LV thrombus. DC weight: 198 lbs.  She was seen in the heart failure clinic in 02/2019. At that time she had self discontinued her Lifevest and had returned it. Echo was repeated in 02/2019 and EF remained severely reduced at 20-25% and RV was moderately reduced.   She was last seen in heart failure clinic by Lyda Jester, PA-C on 11/01/19. She felt fairly decent. Reported improved compliance with meds. She tried to avoid high sodium foods but admitted to eating out at least once, sometimes twice a week (more convenient w/ 30 year old child). She can tell if she gets too much salt in diet, often felt bloated w/ abdominal distention. She had starting taking furosemide daily instead of PRN which seemed to help her. She lives in a 3 floor apartment and was able to go up stairs w/o much dyspnea, she did however endorse bendopnea. No orthopnea/ PND.    Today she returns to HF clinic for pharmacist medication titration. At last visit with PA, Entresto was increased to 49/51 mg BID  and furosemide was increased to 20 mg daily. Today she admits that she has been taking her Entresto "faithfully" but has not taken her furosemide in 1 month and has not taken her carvedilol or spironolactone in 2 weeks. Discussed adherence and the importance of her medication regimen at length. Symptomatically, she is feeling fine. Denies dizziness, lightheadedness, and fatigue. No chest pain or palpitations. Gets SOB during strenuous exertion (running up stairs). She is fine on flat ground. She is able to complete all ADLs. Activity level is moderate. She walks 7,000 steps per day. She checks her weight at home (normal range 190 - 198 lbs). She has not taken her furosemide in a month and has not noticed any LEE or abdominal bloating. Noted good diuresis with increased Entresto dose and has not felt that she needed the furosemide. No PND/Orthopnea. JVD 4cm above clavicle which was less than last visit (up to jawline). Sleeps on 2 pillows. Appetite has been good lately. Has cut out meat from diet. She adheres to a low-salt diet.   HF Medications: Carvedilol 6.25 mg BID Entresto 49/51 mg BID Spironolactone 25 mg daily Furosemide 20 mg PRN + Potassium chloride 20 mEq PRN  Has the patient been experiencing any side effects to the medications prescribed? no  Does the patient have any problems obtaining medications due to transportation or finances?  Yes - only has [employer]. She gets patient assistance for [employer] through [employer].  Understanding of regimen: fair Understanding of indications: fair Potential  of compliance: fair Patient understands to avoid NSAIDs. Patient understands to avoid decongestants.   Pertinent Lab Values (11/29/19): Marland Kitchen Serum creatinine 0.74, BUN 13, Potassium 3.8, Sodium 140, BNP 1,013.7 pg/mL   Vital Signs: . Weight: 195.6 lbs (last clinic weight: 198.3 lbs) . Blood pressure: 132/82  . Heart rate: 93   Assessment: 1. 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  RHC in 2/20 showed normal filling pressures and preserved cardiac output. Cardiac MRI in 2/20 suggested that LAD territory was infarcted and unlikely to be viable with restoration of perfusion. Repeat Echo 02/2019, EF 20-25% RV was moderately reduced  - NYHA Class II  - Volume status stable  - Continue furosemide 20 mg PRN + potassium 20 mEq PRN - Restart carvedilol 6.25 mg BID - Continue Entresto 49/51 BID - Restart spironolactone 25 mg daily  - Pt would be a good candidate for [place] and BiDil in the future if BP can tolerate - Will continue to titrate HF meds until optimized.  - As above, LAD territory is unlikely to be viable based on MRI, so would not pursue CTO procedure or CABG.   2. H/o LV thrombus: On warfarin.  - INR followed at coumadin clinic. Denies abnormal bleeding.    3. CAD - LHC 08/2018 showed occlusion of prox LAD, appears to be a SCAD event - Denies ischemic CP - On high dose statin and coumadin.  - LAD territory is unlikely to be viable based on MRI, so would not pursue CTO procedure or CABG.    Plan: 1) Medication changes: Based on clinical presentation, vital signs and recent labs will restart carvedilol 6.25 mg BID and spironolactone 25 mg daily 2) Labs: Scr 0.74, K 3.8 3) Follow-up: 3 weeks with APP - Consider starting Farxiga at next visit. Will need patient assistance applications or use heart failure fund as patient only has family planning medicaid.  Domenic Moras, PharmD PGY1 Ambulatory Care Pharmacy Resident [employer] Phone: [phone]  Karle Plumber, PharmD, BCPS, [facility], CPP Heart Failure Clinic Pharmacist [phone]

## 2019-11-29 NOTE — Patient Instructions (Addendum)
It was a pleasure seeing you today!  MEDICATIONS: - No medication changes today - Please restart taking your carvedilol and spironolactone - Please refill your furosemide and take this as needed for fluid/bloating -Call if you have questions about your medications.  LABS: -We will call you if your labs need attention.  NEXT APPOINTMENT: Return to clinic in 3 weeks with the PA.  In general, to take care of your heart failure: -Limit your fluid intake to 2 Liters (half-gallon) per day.   -Limit your salt intake to ideally 2-3 grams (2000-3000 mg) per day. -Weigh yourself daily and record, and bring that "weight diary" to your next appointment.  (Weight gain of 2-3 pounds in 1 day typically means fluid weight.) -The medications for your heart are to help your heart and help you live longer.   -Please contact us before stopping any of your heart medications.  Call the clinic at [phone] with questions or to reschedule future appointments.

## 2019-12-12 ENCOUNTER — Telehealth (HOSPITAL_COMMUNITY): Payer: Self-pay | Admitting: Pharmacist

## 2019-12-12 MED ORDER — FUROSEMIDE 20 MG PO TABS: 20.0000 mg | ORAL_TABLET | ORAL | 3 refills | Status: DC | PRN

## 2019-12-12 MED ORDER — POTASSIUM CHLORIDE CRYS ER 20 MEQ PO TBCR: 20.0000 meq | EXTENDED_RELEASE_TABLET | Freq: Every day | ORAL | 3 refills | Status: DC

## 2019-12-12 MED ORDER — SPIRONOLACTONE 25 MG PO TABS: 25.0000 mg | ORAL_TABLET | Freq: Every day | ORAL | 3 refills | Status: DC

## 2019-12-12 NOTE — Telephone Encounter (Signed)
Sent refills for furosemide, potassium and spironolactone per patient request.   Karle Plumber, PharmD, BCPS, BCCP, CPP Heart Failure Clinic Pharmacist [phone]

## 2019-12-20 ENCOUNTER — Encounter (HOSPITAL_COMMUNITY): Payer: Medicaid Other

## 2019-12-27 ENCOUNTER — Other Ambulatory Visit: Payer: Self-pay

## 2019-12-27 MED ORDER — WARFARIN SODIUM 5 MG PO TABS: ORAL_TABLET | ORAL | 0 refills | Status: DC

## 2020-01-03 ENCOUNTER — Other Ambulatory Visit: Payer: Self-pay

## 2020-01-03 ENCOUNTER — Ambulatory Visit (INDEPENDENT_AMBULATORY_CARE_PROVIDER_SITE_OTHER): Payer: Medicaid Other | Admitting: *Deleted

## 2020-01-03 DIAGNOSIS — I236 Thrombosis of atrium, auricular appendage, and ventricle as current complications following acute myocardial infarction: Secondary | ICD-10-CM

## 2020-01-03 DIAGNOSIS — Z5181 Encounter for therapeutic drug level monitoring: Secondary | ICD-10-CM | POA: Diagnosis not present

## 2020-01-03 LAB — POCT INR: INR: 1.9 — AB (ref 2.0–3.0)

## 2020-01-03 NOTE — Patient Instructions (Signed)
Description   Today take extra 1/2 tablet of Warfarin then resume taking the dose you have been taking which is 1 tablet daily except for 2 tablets on Sundays and Thursdays. Recheck in 1 week. Please call us with any questions or new medications. Coumadin Clinic [phone] Main [phone]

## 2020-01-10 ENCOUNTER — Other Ambulatory Visit: Payer: Self-pay

## 2020-01-10 ENCOUNTER — Ambulatory Visit (INDEPENDENT_AMBULATORY_CARE_PROVIDER_SITE_OTHER): Payer: Medicaid Other | Admitting: *Deleted

## 2020-01-10 DIAGNOSIS — I236 Thrombosis of atrium, auricular appendage, and ventricle as current complications following acute myocardial infarction: Secondary | ICD-10-CM | POA: Diagnosis not present

## 2020-01-10 DIAGNOSIS — Z5181 Encounter for therapeutic drug level monitoring: Secondary | ICD-10-CM

## 2020-01-10 LAB — POCT INR: INR: 2.6 (ref 2.0–3.0)

## 2020-01-10 NOTE — Patient Instructions (Signed)
Description   Continue taking Warfarin 1 tablet daily except for 2 tablets on Sundays and Thursdays. Recheck in 3 weeks. Please call us with any questions or new medications. Coumadin Clinic [phone] Main [phone]

## 2020-01-22 ENCOUNTER — Telehealth (HOSPITAL_COMMUNITY): Payer: Self-pay | Admitting: Pharmacy Technician

## 2020-01-22 NOTE — Telephone Encounter (Signed)
It's time to re-enroll patient to receive medication assistance for Entresto from [employer]. Looks like patient has medicaid now. Submitted PA via CoverMyMeds, key: [id number]  Will follow up.

## 2020-01-23 NOTE — Telephone Encounter (Signed)
Patient Advocate Encounter   Received notification from Medicaid that prior authorization for Sheila Estes is required.   PA submitted on [employer]. Key [id number] W Status is pending   Will continue to follow.

## 2020-01-23 NOTE — Telephone Encounter (Signed)
Advanced Heart Failure Patient Advocate Encounter  Prior Authorization for Sheila Estes has been approved.    PA# [id number] Effective dates: 01/23/20 through 01/17/21  Archer Asa, CPhT

## 2020-01-24 ENCOUNTER — Telehealth (HOSPITAL_COMMUNITY): Payer: Self-pay | Admitting: Pharmacist

## 2020-01-24 ENCOUNTER — Telehealth (HOSPITAL_COMMUNITY): Payer: Self-pay | Admitting: Pharmacy Technician

## 2020-01-24 DIAGNOSIS — I5022 Chronic systolic (congestive) heart failure: Secondary | ICD-10-CM

## 2020-01-24 MED ORDER — ENTRESTO 49-51 MG PO TABS: 1.0000 | ORAL_TABLET | Freq: Two times a day (BID) | ORAL | 11 refills | Status: DC

## 2020-01-24 NOTE — Telephone Encounter (Signed)
Spoke with patient. She is going to get her last fill with [facility] and then after that she is going to get [employer] filled at [facility]. Will ask Lauren to send script to [facility] for her.  Archer Asa, CPhT

## 2020-01-24 NOTE — Telephone Encounter (Signed)
Entresto prescription sent to [facility] per patient request.   Karle Plumber, PharmD, BCPS, BCCP, CPP Heart Failure Clinic Pharmacist [phone]

## 2020-01-30 ENCOUNTER — Ambulatory Visit (INDEPENDENT_AMBULATORY_CARE_PROVIDER_SITE_OTHER): Payer: Medicaid Other | Admitting: *Deleted

## 2020-01-30 ENCOUNTER — Other Ambulatory Visit: Payer: Self-pay

## 2020-01-30 DIAGNOSIS — Z5181 Encounter for therapeutic drug level monitoring: Secondary | ICD-10-CM | POA: Diagnosis not present

## 2020-01-30 DIAGNOSIS — I236 Thrombosis of atrium, auricular appendage, and ventricle as current complications following acute myocardial infarction: Secondary | ICD-10-CM | POA: Diagnosis not present

## 2020-01-30 LAB — POCT INR: INR: 1.6 — AB (ref 2.0–3.0)

## 2020-01-30 NOTE — Patient Instructions (Addendum)
Description   Today take 2 tablets then continue taking Warfarin 1 tablet daily except for 2 tablets on Sundays and Thursdays. Recheck in 2 weeks. Please call us with any questions or new medications. Coumadin Clinic [phone] Main [phone]

## 2020-02-11 ENCOUNTER — Other Ambulatory Visit: Payer: Self-pay

## 2020-02-11 ENCOUNTER — Emergency Department (HOSPITAL_COMMUNITY): Payer: Medicaid Other

## 2020-02-11 ENCOUNTER — Emergency Department (HOSPITAL_COMMUNITY)
Admission: EM | Admit: 2020-02-11 | Discharge: 2020-02-11 | Disposition: A | Discharge status: Home / Self Care | Payer: Medicaid Other | Attending: Emergency Medicine | Admitting: Emergency Medicine

## 2020-02-11 DIAGNOSIS — R112 Nausea with vomiting, unspecified: Secondary | ICD-10-CM | POA: Insufficient documentation

## 2020-02-11 DIAGNOSIS — Z5321 Procedure and treatment not carried out due to patient leaving prior to being seen by health care provider: Secondary | ICD-10-CM | POA: Insufficient documentation

## 2020-02-11 DIAGNOSIS — R42 Dizziness and giddiness: Secondary | ICD-10-CM | POA: Insufficient documentation

## 2020-02-11 DIAGNOSIS — F419 Anxiety disorder, unspecified: Secondary | ICD-10-CM | POA: Diagnosis not present

## 2020-02-11 LAB — CBC
HCT: 39.4 % (ref 36.0–46.0)
Hemoglobin: 12.8 g/dL (ref 12.0–15.0)
MCH: 30.3 pg (ref 26.0–34.0)
MCHC: 32.5 g/dL (ref 30.0–36.0)
MCV: 93.1 fL (ref 80.0–100.0)
Platelets: 187 10*3/uL (ref 150–400)
RBC: 4.23 MIL/uL (ref 3.87–5.11)
RDW: 14.5 % (ref 11.5–15.5)
WBC: 4.8 10*3/uL (ref 4.0–10.5)
nRBC: 0 % (ref 0.0–0.2)

## 2020-02-11 LAB — BASIC METABOLIC PANEL
Anion gap: 11 (ref 5–15)
BUN: 13 mg/dL (ref 6–20)
CO2: 24 mmol/L (ref 22–32)
Calcium: 9 mg/dL (ref 8.9–10.3)
Chloride: 103 mmol/L (ref 98–111)
Creatinine, Ser: 0.71 mg/dL (ref 0.44–1.00)
GFR calc Af Amer: >60 mL/min (ref >60)
GFR calc non Af Amer: >60 mL/min (ref >60)
Glucose, Bld: 82 mg/dL (ref 70–99)
Potassium: 3.7 mmol/L (ref 3.5–5.1)
Sodium: 138 mmol/L (ref 135–145)

## 2020-02-11 LAB — TROPONIN I (HIGH SENSITIVITY): Troponin I (High Sensitivity): 6 ng/L (ref <18)

## 2020-02-11 LAB — I-STAT BETA HCG BLOOD, ED (MC, WL, AP ONLY): I-stat hCG, quantitative: <5 m[IU]/mL (ref <5)

## 2020-02-11 MED ORDER — SODIUM CHLORIDE 0.9% FLUSH: 3.0000 mL | Freq: Once | INTRAVENOUS | Status: DC

## 2020-02-11 NOTE — ED Notes (Signed)
Pt stated they wanted to leave because of the wait and we advised her to stay but she decided to leave.

## 2020-02-11 NOTE — ED Triage Notes (Signed)
Pt states she has hx of CHF and MI; pt states she feels she was having an anxiety attach but wanted to confirm she was not having MI; Pt states n/v and "heart racing" with dizziness and lightheaded; pt states she has been disoriented; Pt c/o middle back pain but states she has been doing a lot of standing and cleaning; Pt states she is having some of the same sx as when she had MI; Pt states hx of bipolar; Pt A&Ox 4 on arrival; Pt denies chest pain; Pt states she missed x2 dose of heart medication by accident-Monique,RN

## 2020-02-20 ENCOUNTER — Ambulatory Visit: Payer: Medicaid Other | Admitting: Pharmacist

## 2020-02-20 ENCOUNTER — Other Ambulatory Visit: Payer: Self-pay

## 2020-02-20 DIAGNOSIS — Z5181 Encounter for therapeutic drug level monitoring: Secondary | ICD-10-CM

## 2020-02-20 DIAGNOSIS — I236 Thrombosis of atrium, auricular appendage, and ventricle as current complications following acute myocardial infarction: Secondary | ICD-10-CM | POA: Diagnosis not present

## 2020-02-20 LAB — POCT INR: INR: 1.7 — AB (ref 2.0–3.0)

## 2020-02-20 IMAGING — CT CT ANGIO CHEST
2 of 6 series · 18 of 46 positions shown · IV contrast (ISOVUE 370)
Comparison: Chest x-ray from same day.

CLINICAL DATA: Severe shortness of breath.

EXAM:
CT ANGIOGRAPHY CHEST WITH CONTRAST
TECHNIQUE: Multidetector CT imaging of the chest was performed using the
standard protocol during bolus administration of intravenous
contrast. Multiplanar CT image reconstructions and MIPs were
obtained to evaluate the vascular anatomy.
CONTRAST:  100mL [id number] IOPAMIDOL ([id number]) INJECTION 76%

[Series 5: thins · axial · 0.75mm/px · z∈[-191,+20]mm · 15 of 233 slices shown]
[im 11/233  lung]
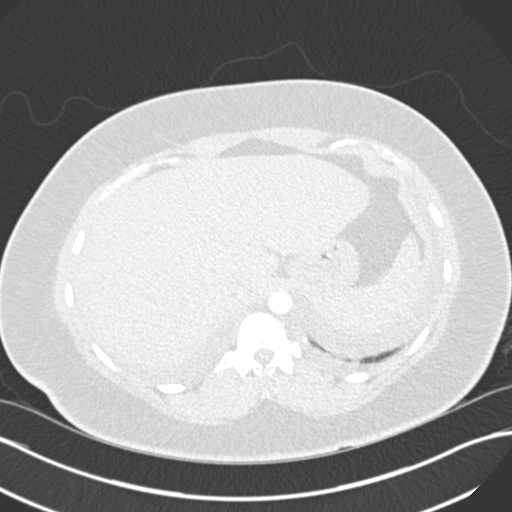
[im 31/233  soft-tissue]
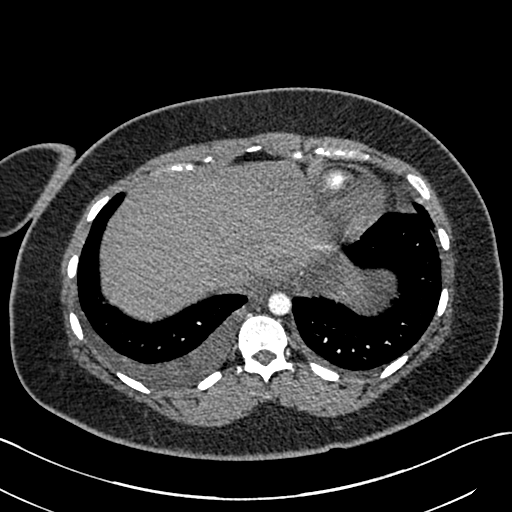
[im 41/233  lung]
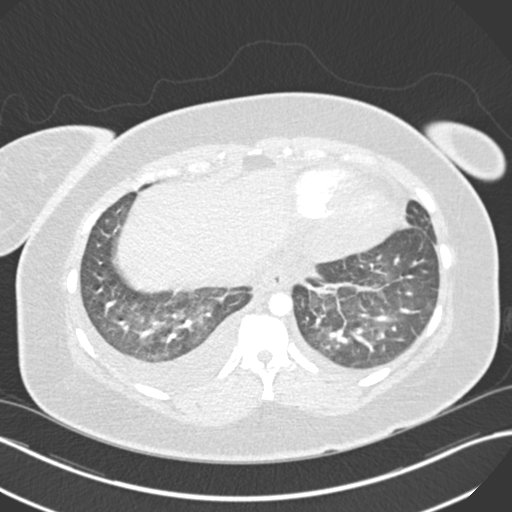
[im 61/233  soft-tissue]
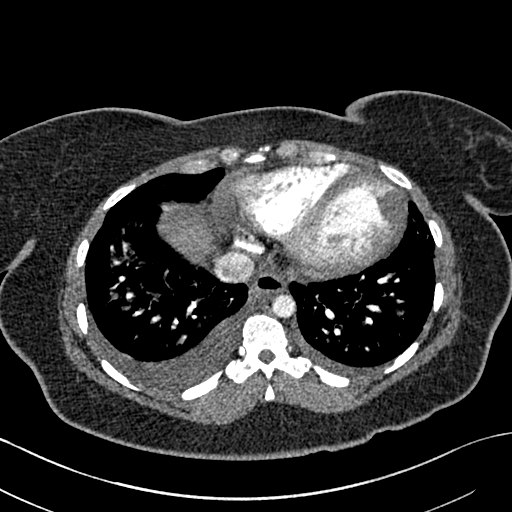
[im 71/233  lung]
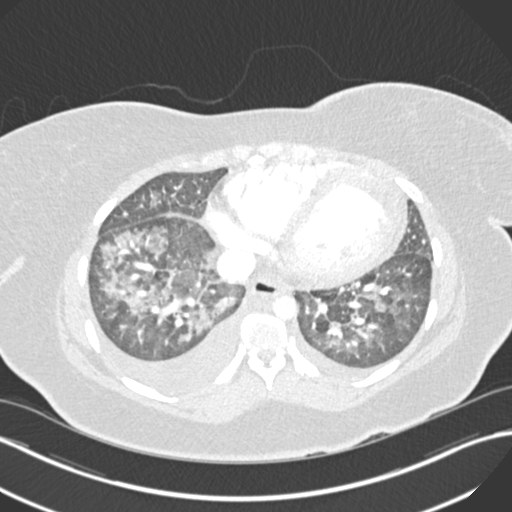
[im 91/233  soft-tissue]
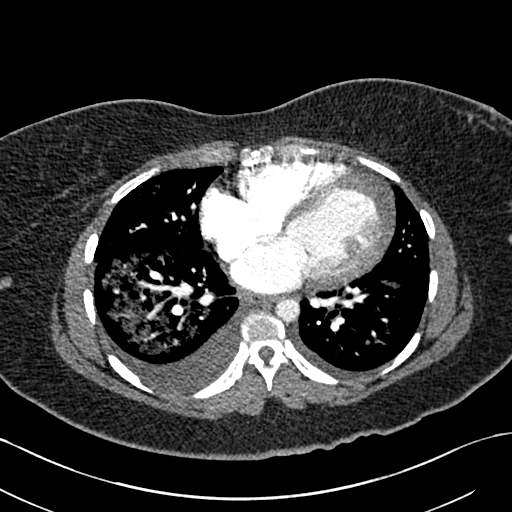
[im 101/233  lung]
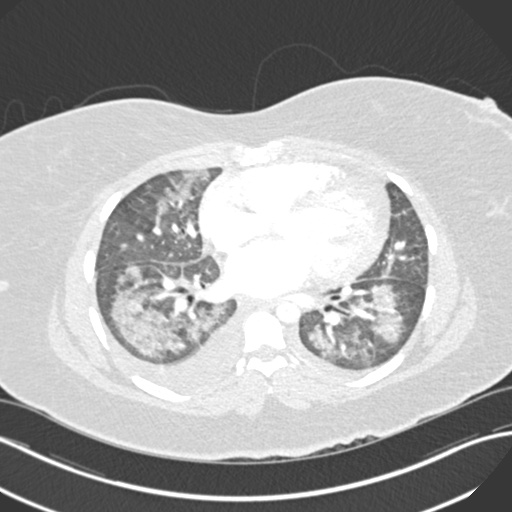
[im 122/233  soft-tissue]
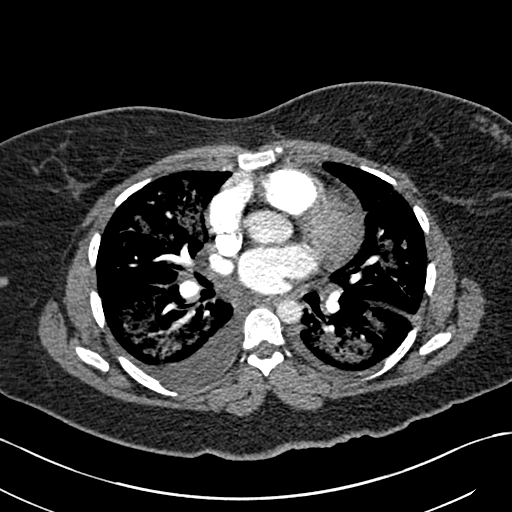
[im 132/233  lung]
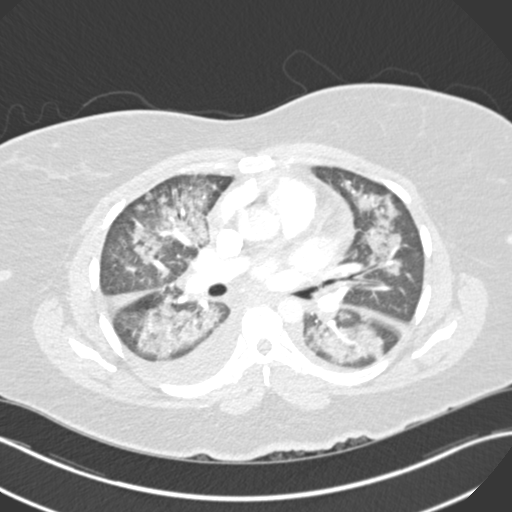
[im 142/233  soft-tissue]
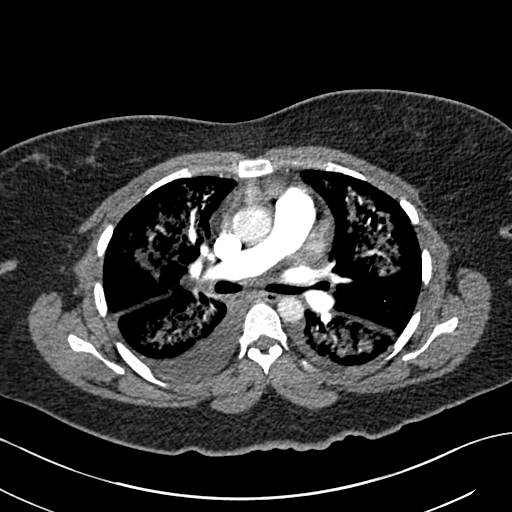
[im 162/233  lung]
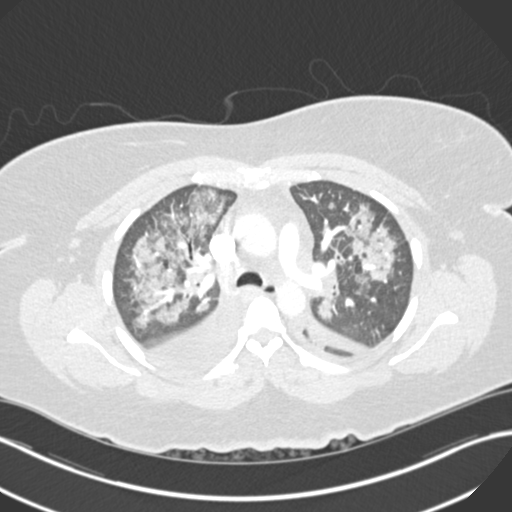
[im 172/233  soft-tissue]
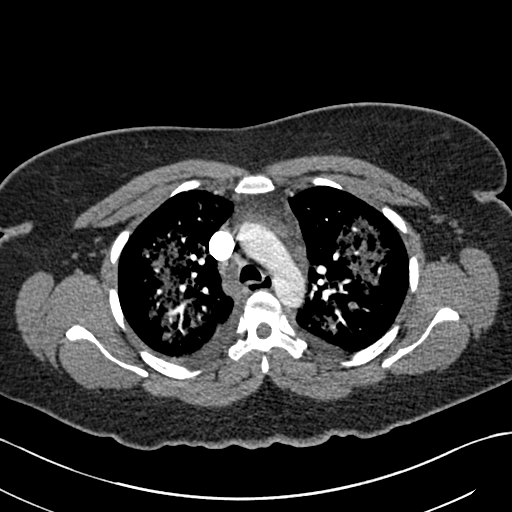
[im 192/233  lung]
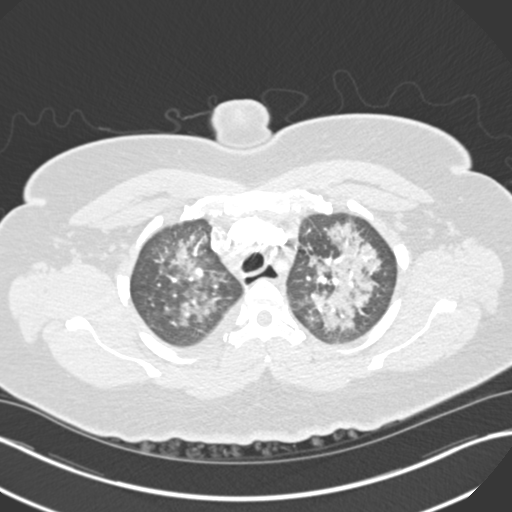
[im 202/233  soft-tissue]
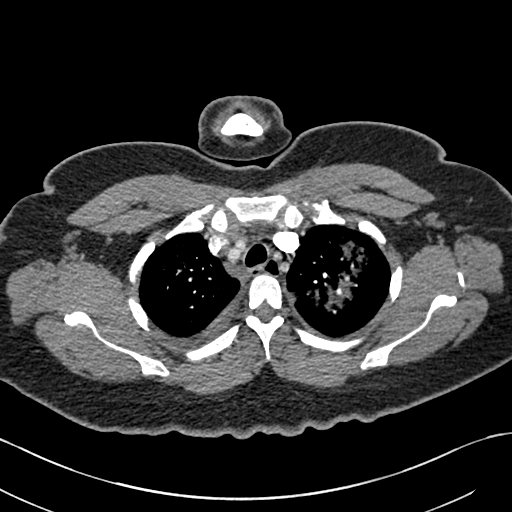
[im 222/233  lung]
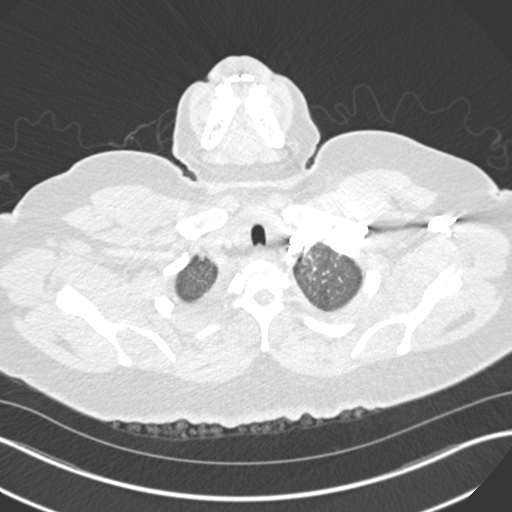

[Series 7: coronal mpr · coronal · 0.48mm/px · 3 of 131 slices shown]
[im 33/131  soft-tissue]
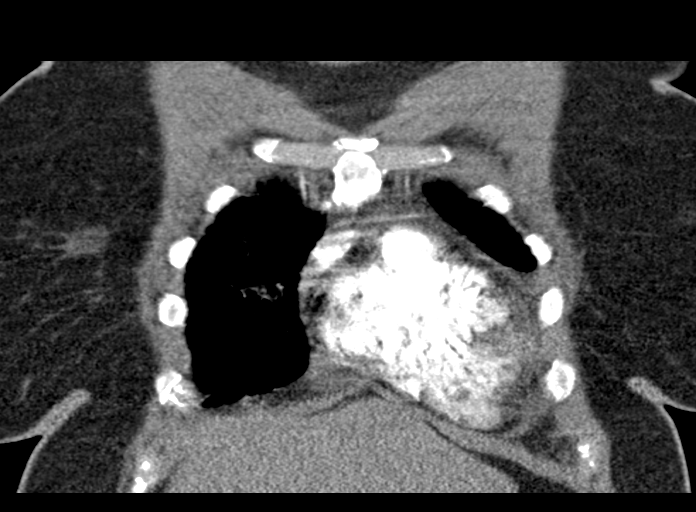
[im 66/131  soft-tissue]
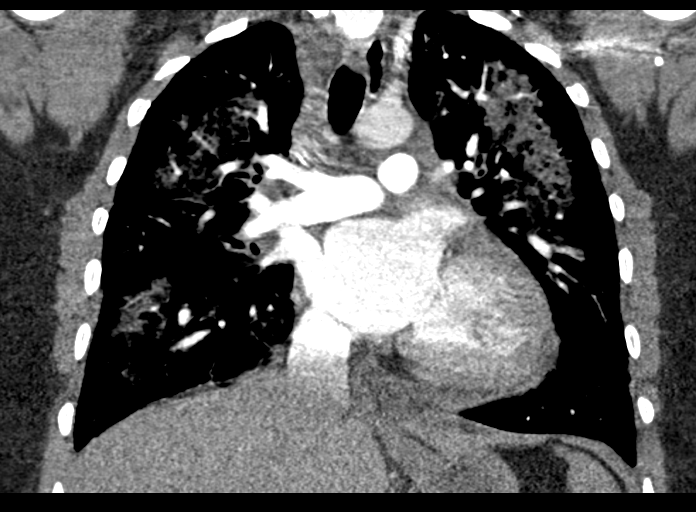
[im 98/131  soft-tissue]
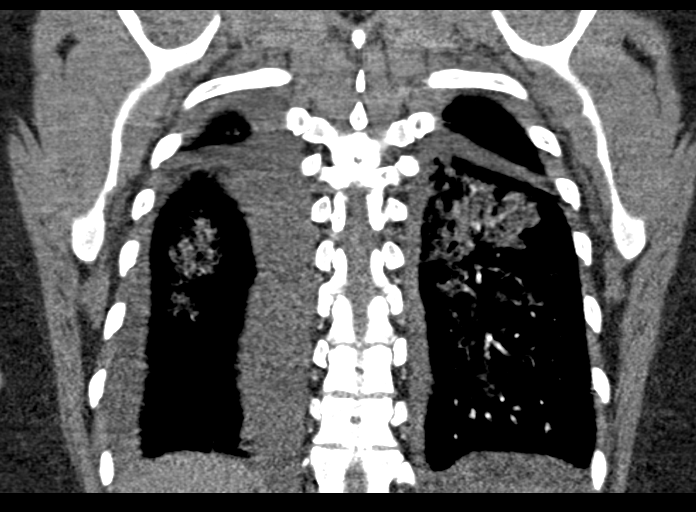

[18 of 46 positions shown; findings below may reference images not displayed]

FINDINGS: Cardiovascular: Satisfactory opacification of the pulmonary arteries
to the segmental level. No evidence of pulmonary embolism. Mild
cardiomegaly. Trace pericardial effusion. No thoracic aortic
aneurysm or dissection.

Mediastinum/Nodes: Prominent subcentimeter bilateral hilar lymph
nodes are likely reactive. No enlarged mediastinal or axillary lymph
nodes. Thyroid gland, trachea, and esophagus demonstrate no
significant findings.

Lungs/Pleura: Relatively symmetric, diffuse, central ground-glass
and consolidative peribronchovascular densities in both lungs.
Scattered mild interlobular septal thickening. Diffuse moderate to
severe peribronchial thickening. No pneumothorax.

Upper Abdomen: No acute abnormality.

Musculoskeletal: No chest wall abnormality. No acute or significant
osseous findings.

Review of the MIP images confirms the above findings.
IMPRESSION: 1. Relatively symmetric diffuse central ground-glass and
consolidative peribronchovascular densities in both lungs may
reflect cardiogenic pulmonary edema given mild cardiomegaly,
interlobular septal thickening, and small bilateral pleural
effusions. However, the differential also includes non-cardiogenic
capillary leak pulmonary edema such as from prolonged hypotension,
fat emboli syndrome, toxic inhalation, acute hypersensitivity
pneumonitis, or drug reaction. Diffuse alveolar hemorrhage could
have a similar appearance.
2.  No evidence of pulmonary embolism.

## 2020-02-20 NOTE — Patient Instructions (Signed)
Description   Today take 2 tablets then continue taking Warfarin 1 tablet daily except for 2 tablets on Sundays and Thursdays. Recheck in 2 weeks. Sheila Estes Stable [id number] for traditional Medicaid* Please call us with any questions or new medications. Coumadin Clinic [phone] Main [phone]

## 2020-03-17 ENCOUNTER — Other Ambulatory Visit: Payer: Self-pay

## 2020-03-17 ENCOUNTER — Ambulatory Visit: Payer: Medicaid Other

## 2020-03-17 DIAGNOSIS — I236 Thrombosis of atrium, auricular appendage, and ventricle as current complications following acute myocardial infarction: Secondary | ICD-10-CM | POA: Diagnosis not present

## 2020-03-17 DIAGNOSIS — Z5181 Encounter for therapeutic drug level monitoring: Secondary | ICD-10-CM | POA: Diagnosis not present

## 2020-03-17 LAB — POCT INR: INR: 2 (ref 2.0–3.0)

## 2020-03-17 NOTE — Patient Instructions (Signed)
Description   Continue taking Warfarin 1 tablet daily except for 2 tablets on Sundays and Thursdays. Recheck in 4 weeks. Annette Stable [id number] for traditional Medicaid* Please call us with any questions or new medications. Coumadin Clinic [phone] Main [phone]

## 2020-03-19 ENCOUNTER — Other Ambulatory Visit (HOSPITAL_COMMUNITY): Payer: Self-pay | Admitting: *Deleted

## 2020-03-19 MED ORDER — ALBUTEROL SULFATE HFA 108 (90 BASE) MCG/ACT IN AERS: 2.0000 | INHALATION_SPRAY | Freq: Four times a day (QID) | RESPIRATORY_TRACT | 2 refills | Status: DC | PRN

## 2020-03-20 ENCOUNTER — Other Ambulatory Visit (HOSPITAL_COMMUNITY): Payer: Self-pay | Admitting: Cardiology

## 2020-03-21 ENCOUNTER — Other Ambulatory Visit: Payer: Self-pay | Admitting: Cardiology

## 2020-03-21 NOTE — Telephone Encounter (Signed)
Rx had been sent in. Spoke with pt who states she received notification from pharmacy that rx was received, there is issue with insurance they are working out. Pt appreciative of the call

## 2020-03-21 NOTE — Telephone Encounter (Signed)
New Message  Pt called in to fu on the Warfarin refill, pt stated she rec'd a message the med was not approved

## 2020-04-14 ENCOUNTER — Other Ambulatory Visit: Payer: Self-pay

## 2020-04-14 ENCOUNTER — Ambulatory Visit (INDEPENDENT_AMBULATORY_CARE_PROVIDER_SITE_OTHER): Payer: Medicaid Other

## 2020-04-14 DIAGNOSIS — I236 Thrombosis of atrium, auricular appendage, and ventricle as current complications following acute myocardial infarction: Secondary | ICD-10-CM

## 2020-04-14 DIAGNOSIS — Z5181 Encounter for therapeutic drug level monitoring: Secondary | ICD-10-CM | POA: Diagnosis not present

## 2020-04-14 LAB — POCT INR: INR: 2.3 (ref 2.0–3.0)

## 2020-04-14 MED ORDER — WARFARIN SODIUM 5 MG PO TABS: ORAL_TABLET | ORAL | 1 refills | Status: DC

## 2020-04-14 NOTE — Patient Instructions (Signed)
Description   Continue taking Warfarin 1 tablet daily except for 2 tablets on Sundays and Thursdays. Recheck in 4 weeks. Annette Stable [id number] for traditional Medicaid* Please call us with any questions or new medications. Coumadin Clinic [phone] Main [phone]

## 2020-04-17 ENCOUNTER — Ambulatory Visit (HOSPITAL_COMMUNITY)
Admission: RE | Admit: 2020-04-17 | Discharge: 2020-04-17 | Disposition: A | Discharge status: Home / Self Care | Payer: Medicaid Other | Source: Ambulatory Visit | Attending: Cardiology | Admitting: Cardiology

## 2020-04-17 ENCOUNTER — Encounter (HOSPITAL_COMMUNITY): Payer: Self-pay

## 2020-04-17 ENCOUNTER — Other Ambulatory Visit: Payer: Self-pay

## 2020-04-17 VITALS — BP 140/98 | HR 84 | Wt 214.0 lb

## 2020-04-17 DIAGNOSIS — Z8249 Family history of ischemic heart disease and other diseases of the circulatory system: Secondary | ICD-10-CM | POA: Insufficient documentation

## 2020-04-17 DIAGNOSIS — Z7901 Long term (current) use of anticoagulants: Secondary | ICD-10-CM | POA: Insufficient documentation

## 2020-04-17 DIAGNOSIS — I2510 Atherosclerotic heart disease of native coronary artery without angina pectoris: Secondary | ICD-10-CM | POA: Diagnosis not present

## 2020-04-17 DIAGNOSIS — I5022 Chronic systolic (congestive) heart failure: Secondary | ICD-10-CM

## 2020-04-17 DIAGNOSIS — I255 Ischemic cardiomyopathy: Secondary | ICD-10-CM | POA: Diagnosis not present

## 2020-04-17 DIAGNOSIS — Z9114 Patient's other noncompliance with medication regimen: Secondary | ICD-10-CM | POA: Diagnosis not present

## 2020-04-17 DIAGNOSIS — J45909 Unspecified asthma, uncomplicated: Secondary | ICD-10-CM | POA: Insufficient documentation

## 2020-04-17 DIAGNOSIS — E669 Obesity, unspecified: Secondary | ICD-10-CM | POA: Diagnosis not present

## 2020-04-17 DIAGNOSIS — Z79899 Other long term (current) drug therapy: Secondary | ICD-10-CM | POA: Diagnosis not present

## 2020-04-17 LAB — LIPID PANEL
Cholesterol: 117 mg/dL (ref 0–200)
HDL: 42 mg/dL (ref >40)
LDL Cholesterol: 57 mg/dL (ref 0–99)
Total CHOL/HDL Ratio: 2.8 RATIO
Triglycerides: 89 mg/dL (ref <150)
VLDL: 18 mg/dL (ref 0–40)

## 2020-04-17 LAB — BRAIN NATRIURETIC PEPTIDE: B Natriuretic Peptide: 948.1 pg/mL — ABNORMAL HIGH (ref 0.0–100.0)

## 2020-04-17 LAB — BASIC METABOLIC PANEL
Anion gap: 9 (ref 5–15)
BUN: 18 mg/dL (ref 6–20)
CO2: 23 mmol/L (ref 22–32)
Calcium: 9.3 mg/dL (ref 8.9–10.3)
Chloride: 108 mmol/L (ref 98–111)
Creatinine, Ser: 0.58 mg/dL (ref 0.44–1.00)
GFR, Estimated: >60 mL/min (ref >60)
Glucose, Bld: 94 mg/dL (ref 70–99)
Potassium: 3.6 mmol/L (ref 3.5–5.1)
Sodium: 140 mmol/L (ref 135–145)

## 2020-04-17 MED ORDER — FUROSEMIDE 20 MG PO TABS: 20.0000 mg | ORAL_TABLET | ORAL | 3 refills | Status: DC | PRN

## 2020-04-17 MED ORDER — DAPAGLIFLOZIN PROPANEDIOL 10 MG PO TABS
10.0000 mg | ORAL_TABLET | Freq: Every day | ORAL | 11 refills | Status: DC
Start: 2020-04-17 — End: 2020-11-06

## 2020-04-17 NOTE — Progress Notes (Signed)
Date:  04/17/2020   ID:  Sheila Estes, DOB 03-23-1991, MRN [id number]  Location: Home  Provider location: [place] [facility] Type of Visit: Established patient   PCP:  Grayce Sessions, NP  Cardiologist:  Dr. Shirlee Latch  Chief Complaint: F/u for Chronic Systolic Heart Failure    History of Present Illness: Sheila Estes is a 29 year old with a history of history of obesity, anxiety, asthma, bipolar disorder,  Depression, and CAD, chronic systolic heart failure.  Admitted 2/23-09/04/18 with pulmonary edema. She had been short of breath for about a month, which started after drinking 2 Red Bulls. She had been seen at Urgent Care and [facility] and was treated for asthma and for PNA. Echo at 2/20 admission showed newly reduced EF 25-30% with peri-apical akinesis and apical thrombus. Transferred to [facility] and had LHC/RHC, which showed normal filling pressures and preserved cardiac output. The proximal LAD was subtotally occluded, it looked like a SCAD event. Cardiac MRI suggested that LAD territory was infarcted and unlikely to be viable with restoration of perfusion.HF medications were optimized and she was set up with LifeVest for discharge. She was started on coumadin for LV thrombus. DC weight: 198 lbs.  Seen in the [facility] 02/2019. At that time she had self discontinued her Lifevest and had returned it. Echo was repeated at visit and EF remained severely reduced at 20-25% and RV was moderately reduced.   She has been seen several times in the last several months for med titration. Last seen by pharmD 5/21 and admitted to stopping Coreg and spiro (no good reason). Both meds were resumed. She was restarted on Coreg 6.25 mg bid and Spiro 25 mg daily. She is also on Entresto and PRN lasix. On coumdin for LV thrombus. INRs followed by [address] [facility]in clinic.   She returns to clinic today. Wt is up >10 lb since previous. BP mildly elevated. She admits to poor compliance w/ meds and  admits to bad food choices. Busy life. Mother and current IT consultant at [employer], commutes to [place]. Also studying for the L-SAT. Pretty good about taking AM meds but often forgets to take evening doses.    We discussed importance of avoiding pregnancy. She is not on OCPs. She uses condoms.    Labs (3/20): K 3.9, creatinine 0.79 Labs (8/21): K 3.6 creatinine 0.71  PMH: 1. Depression 2. Anxiety 3. Asthma 4. Chronic systolic CHF: Ischemic cardiomyopathy.   - Echo (2/20) with EF 25-30%, peri-apical akinesis, LV thrombus.  - LHC/RHC (2/20): Subtotal LAD occlusion after S1.  Appearance of LAD suggests SCAD.  Mean RA 1, PA 36/8 mean 23, mean PCWP 12, CI 2.46.  - Cardiac MRI (2/20): Mildly dilated LV with EF 27%, delayed enhancement images suggest a dense LAD-territory infarct (myocardium unlikely to be viable), LV apical thrombus, RV EF 31%.  5. CAD: LHC (2/20) with subtotal LAD occlusion after S1.  Appearance of LAD suggests SCAD.  Current Outpatient Medications  Medication Sig Dispense Refill  . albuterol (VENTOLIN HFA) 108 (90 Estes) MCG/ACT inhaler Inhale 2 puffs into the lungs every 6 (six) hours as needed for wheezing or shortness of breath. 1 each 2  . atorvastatin (LIPITOR) 80 MG tablet Take 1 tablet (80 mg total) by mouth daily at 6 PM. 30 tablet 6  . carvedilol (COREG) 6.25 MG tablet Take 1 tablet (6.25 mg total) by mouth 2 (two) times daily with a meal. 180 tablet 3  . potassium  chloride SA (KLOR-CON) 20 MEQ tablet Take 1 tablet (20 mEq total) by mouth daily. With every LASIX dose 90 tablet 3  . sacubitril-valsartan (ENTRESTO) 49-51 MG Take 1 tablet by mouth 2 (two) times daily. 60 tablet 11  . spironolactone (ALDACTONE) 25 MG tablet Take 1 tablet (25 mg total) by mouth at bedtime. 90 tablet 3  . warfarin (COUMADIN) 5 MG tablet Take as directed by Anticoagulation Clinic 40 tablet 1  . furosemide (LASIX) 20 MG tablet Take 1 tablet (20 mg total) by mouth as needed. 90 tablet 3   No  current facility-administered medications for this encounter.    Allergies:   Other and Gluten meal   Social History:  The patient  reports that she has never smoked. She has never used smokeless tobacco. She reports current alcohol use. She reports that she does not use drugs.   Family History:  The patient's family history includes Diabetes in her maternal grandmother; Hypertension in her father, maternal grandfather, and mother.   ROS:  Please see the history of present illness.   All other systems are personally reviewed and negative.  Vitals:   04/17/20 1522  BP: (!) 140/98  Pulse: 84  SpO2: 96%   PHYSICAL EXAM: General:  Well appearing, moderately obese. No respiratory difficulty HEENT: normal Neck: supple. JVD ~8 cm. Carotids 2+ bilat; no bruits. No lymphadenopathy or thyromegaly appreciated. Cor: PMI nondisplaced. Regular rate & rhythm. No rubs, gallops or murmurs. Lungs: clear Abdomen: obese, soft, nontender, nondistended. No hepatosplenomegaly. No bruits or masses. Good bowel sounds. Extremities: no cyanosis, clubbing, rash, edema Neuro: alert & oriented x 3, cranial nerves grossly intact. moves all 4 extremities w/o difficulty. Affect pleasant.    Recent Labs: 11/29/2019: B Natriuretic Peptide 1,013.7 02/11/2020: BUN 13; Creatinine, Ser 0.71; Hemoglobin 12.8; Platelets 187; Potassium 3.7; Sodium 138     Wt Readings from Last 3 Encounters:  04/17/20 97.1 kg (214 lb)  11/01/19 89.9 kg (198 lb 3.2 oz)  04/11/19 89.2 kg (196 lb 9.6 oz)      ASSESSMENT AND PLAN:  1. 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  RHC in 2/20 showed normal filling pressures and preserved cardiac output.  Cardiac MRI in 2/20 suggested that LAD territory was infarcted and unlikely to be viable with restoration of perfusion. Repeat Echo 02/2019, EF 20-25% RV was moderately reduced  -  NYHA II-III. She is mildly fluid overloaded on exam and wt up ~10 lb. This is in the setting of poor med compliance. Long discussion regarding importance of strict med compliance to reduce risk of hospitalization/ decrease mortality risk.  - I recommended increasing Entersto to 97-103 but she is adamant that she does not want to be on high dose Entresto - she is agreeable w/ starting an SGLT2i. Will check baseline HgbA1c and will start Farxiga 10 mg daily  - continue Entresto 49-51 bid  - Continue Lasix 20 mg daily  - Continue Spironolactone 25 mg daily  - Continue Coreg 6.25 mg bid - Check BMP today and again in 7 days  - consider future addition of Bidil if BP can tolerate (AA)  - We discussed again today the need to avoid pregnancy given teratogenic potentional of cardiac meds. She understands she need to contact HF clinic if she is gets pregnant so we adjust HF medications.  - As above, LAD territory is unlikely to be viable  based on MRI, so would not pursue CTO procedure or CABG.   2. H/o LV thrombus: On warfarin.  - INR followed at coumadin clinic. Denies abnormal bleeding    3. 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   -LAD territory is unlikely to be viable based on MRI, so would not pursue CTO procedure or CABG. - check lipid panel and HFTs. LDL goal <70    F/u w/ pharmD in 3-4 more weeks. APP or Dr. Shirlee Latch in 8 weeks. Will need repeat echo but will need to wait until meds are fully optimized. May need to consider ICD if EF remains < 35%, if she is willing.      Signed, Robbie Lis, PA-C  04/17/2020 3:39 PM  [facility] [address] [facility] V[facility] [place] [address] [phone] (office) [phone] (fax)

## 2020-04-17 NOTE — Patient Instructions (Signed)
START Farxiga 10 mg, one tab daily  Labs today We will only contact you if something comes back abnormal or we need to make some changes. Otherwise no news is good news!  Labs needed in 10 days  Your physician recommends that you schedule a follow-up appointment in: 4 weeks with the pharmacy team  Your physician recommends that you schedule a follow-up appointment in: 3 months with Dr Shirlee Latch  If you have any questions or concerns before your next appointment please send [place] a message through [facility] or call our office at [phone].    TO LEAVE A MESSAGE FOR THE NURSE SELECT OPTION 2, PLEASE LEAVE A MESSAGE INCLUDING: . YOUR NAME . DATE OF BIRTH . CALL BACK NUMBER . REASON FOR CALL**this is important as we prioritize the call backs  YOU WILL RECEIVE A CALL BACK THE SAME DAY AS LONG AS YOU CALL BEFORE 4:00 PM

## 2020-04-18 ENCOUNTER — Telehealth (HOSPITAL_COMMUNITY): Payer: Self-pay | Admitting: Pharmacy Technician

## 2020-04-18 LAB — HEMOGLOBIN A1C
Hgb A1c MFr Bld: 5.9 % — ABNORMAL HIGH (ref 4.8–5.6)
Mean Plasma Glucose: 123 mg/dL

## 2020-04-18 NOTE — Telephone Encounter (Signed)
Patient Advocate Encounter   Received notification from Medicaid that prior authorization for Sheila Estes is required.   PA submitted on [employer] [id number] W Status is pending   Will continue to follow.

## 2020-04-23 NOTE — Telephone Encounter (Signed)
Advanced Heart Failure Patient Advocate Encounter  Prior Authorization for Marcelline Deist has been approved.    PA# [id number] Effective dates: 04/18/20 through 04/18/21  Archer Asa, CPhT

## 2020-04-28 ENCOUNTER — Ambulatory Visit (HOSPITAL_COMMUNITY)
Admission: RE | Admit: 2020-04-28 | Discharge: 2020-04-28 | Disposition: A | Discharge status: Home / Self Care | Payer: Medicaid Other | Source: Ambulatory Visit | Attending: Internal Medicine | Admitting: Internal Medicine

## 2020-04-28 ENCOUNTER — Other Ambulatory Visit: Payer: Self-pay

## 2020-04-28 DIAGNOSIS — I255 Ischemic cardiomyopathy: Secondary | ICD-10-CM | POA: Diagnosis not present

## 2020-04-28 LAB — BASIC METABOLIC PANEL
Anion gap: 6 (ref 5–15)
BUN: 10 mg/dL (ref 6–20)
CO2: 29 mmol/L (ref 22–32)
Calcium: 9.7 mg/dL (ref 8.9–10.3)
Chloride: 105 mmol/L (ref 98–111)
Creatinine, Ser: 0.62 mg/dL (ref 0.44–1.00)
GFR, Estimated: >60 mL/min (ref >60)
Glucose, Bld: 91 mg/dL (ref 70–99)
Potassium: 4 mmol/L (ref 3.5–5.1)
Sodium: 140 mmol/L (ref 135–145)

## 2020-05-07 NOTE — Progress Notes (Signed)
PCP:  Grayce Sessions, NP          Cardiologist:  Dr. Shirlee Latch  HPI:  Sheila Estes is a 29 year old with a history of history of obesity, anxiety, asthma, bipolar disorder,  Depression, and CAD, chronic systolic heart failure.  Admitted 2/23-09/04/18 with pulmonary edema. She had been short of breath for about a month, which started after drinking 2 Red Bulls. She had been seen at [facility] and [facility] and was treated for asthma and for PNA. Echo at 2/20 admission showed newly reducedEF 25-30% with peri-apical akinesis and apical thrombus.Transferred to [facility] and hadLHC/RHC, whichshowed normal filling pressures and preserved cardiac output. The proximal LAD was subtotally occluded, it looked like a SCAD event. Cardiac MRI suggested that LAD territory was infarcted and unlikely to be viable with restoration of perfusion.HF medications were optimized and she was set up with LifeVest for discharge. She was started on coumadin for LV thrombus. DC weight: 198 lbs.  Seen in the [facility] 02/2019. At that time she had self discontinued her Lifevest and had returned it. Echo was repeated at visit and EF remained severely reduced at 20-25% and RV was moderately reduced.   She has been seen several times in the last several months for med titration. Last seen by pharmD 5/21 and admitted to stopping carvedilol and spironolactone (no good reason). Both meds were resumed. She was restarted on carvedilol 6.25 mg BID and Spironolactone 25 mg daily. She was also on Entresto and PRN furosemide. On coumadin for LV thrombus. INRs [facility].   She recently returned to HF Clinic for follow up on 11/29/19 with [facility]. Weight was up >10 lbs since previous visit. BP was mildly elevated. She admitted to poor compliance with medications and admitted to bad food choices. Busy life. 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 Also was studying for the L-SAT. Pretty good about  taking AM meds but noted she often forgets to take evening doses.  Discussed importance of avoiding pregnancy. She was not on OCPs. She reported using condoms.  Today she returns to HF clinic for pharmacist medication titration. At last visit with [facility] Clinic, Sheila Estes 10 mg daily was initiated. Overall she is feeling very well today. Has cut out meat from her diet and has been taking her medications regularly. No dizziness, lightheadedness, chest pain or palpitations. No SOB/DOE. Weight is down 10 lbs since last visit. Weight has been stable at home. She has only needed one dose of PRN furosemide in weeks.  No LEE, PND or orthopnea.   HF Medications: Carvedilol 6.25 mg BID Entresto 49/51 mg BID Spironolactone 25 mg daily Sheila Estes 10 mg daily Furosemide 20 mg daily  Potassium chloride 20 mEq daily  Has the patient been experiencing any side effects to the medications prescribed?  no  Does the patient have any problems obtaining medications due to transportation or finances?   No - has [place] Medicaid  Understanding of regimen: good Understanding of indications: good Potential of compliance: good Patient understands to avoid NSAIDs. Patient understands to avoid decongestants.    Pertinent Lab Values (04/28/20):  Serum creatinine 0.62, BUN 10, Potassium 4.0, Sodium 140  Vital Signs:  Weight: 204.4 lbs (last clinic weight: 214 lbs)  Blood pressure: 122/74   Heart rate: 76   Assessment: 1. 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  RHC in 2/20 showed normal filling pressures and preserved cardiac output. Cardiac MRI in 2/20 suggested that LAD territory was infarcted and unlikely to be viable with restoration of perfusion. Repeat Echo 02/2019, EF 20-25% RV was moderately reduced  - NYHA II. Euvolemic on exam.   - Patient does not wish to make any medication changes today.  Prefers to focus on diet and exercise for the time being. We discussed potentially increasing Entresto and carvedilol in the future, but will hold off for now.  - Continue furosemide 20 mg daily PRN - Continue carvedilol 6.25 mg BID - Continue Entresto 49-51 mg BID.  - Continue Spironolactone 25 mg daily  - Continue Sheila Estes 10 mg daily  - We discussed again today the need to avoid pregnancy given teratogenic potentional of cardiac meds. She understands she need to contact HF clinic if she is gets pregnant so we adjust HF medications.  - As above, LAD territory is unlikely to be viable based on MRI, so would not pursue CTO procedure or CABG.   2. H/o LV thrombus: On warfarin.  - INR followed at coumadin clinic. Denies abnormal bleeding    3. 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   -LAD territory is unlikely to be viable based on MRI, so would not pursue CTO procedure or CABG. - check lipid panel and HFTs. LDL goal <70     Plan: 1) Medication changes: Based on clinical presentation, vital signs and recent labs will make no medication changes today as patient prefers to focus on diet and exercise at this time.  3) Follow-up: 2 months with Dr. Jonn Estes, PharmD, BCPS, [facility], CPP Heart Failure Clinic Pharmacist [phone]

## 2020-05-13 ENCOUNTER — Ambulatory Visit (INDEPENDENT_AMBULATORY_CARE_PROVIDER_SITE_OTHER): Payer: Medicaid Other | Admitting: *Deleted

## 2020-05-13 ENCOUNTER — Other Ambulatory Visit: Payer: Self-pay

## 2020-05-13 DIAGNOSIS — Z5181 Encounter for therapeutic drug level monitoring: Secondary | ICD-10-CM | POA: Diagnosis not present

## 2020-05-13 DIAGNOSIS — I236 Thrombosis of atrium, auricular appendage, and ventricle as current complications following acute myocardial infarction: Secondary | ICD-10-CM | POA: Diagnosis not present

## 2020-05-13 LAB — POCT INR: INR: 1.5 — AB (ref 2.0–3.0)

## 2020-05-13 NOTE — Patient Instructions (Signed)
Description   Today take 2 tablets then start taking Warfarin 2 tablets daily except for 1 tablet on Monday, Wednesday, and Friday. Recheck in 1 week. Annette Stable [id number] for traditional Medicaid* Please call us with any questions or new medications. Coumadin Clinic [phone] Main [phone]

## 2020-05-19 ENCOUNTER — Ambulatory Visit (HOSPITAL_COMMUNITY)
Admission: RE | Admit: 2020-05-19 | Discharge: 2020-05-19 | Disposition: A | Discharge status: Home / Self Care | Payer: Medicaid Other | Source: Ambulatory Visit | Attending: Pharmacist | Admitting: Pharmacist

## 2020-05-19 ENCOUNTER — Other Ambulatory Visit: Payer: Self-pay

## 2020-05-19 VITALS — BP 122/74 | HR 76 | Wt 204.4 lb

## 2020-05-19 DIAGNOSIS — I5022 Chronic systolic (congestive) heart failure: Secondary | ICD-10-CM | POA: Diagnosis not present

## 2020-05-19 DIAGNOSIS — I255 Ischemic cardiomyopathy: Secondary | ICD-10-CM | POA: Insufficient documentation

## 2020-05-19 DIAGNOSIS — Z7901 Long term (current) use of anticoagulants: Secondary | ICD-10-CM | POA: Diagnosis not present

## 2020-05-19 DIAGNOSIS — I2510 Atherosclerotic heart disease of native coronary artery without angina pectoris: Secondary | ICD-10-CM | POA: Diagnosis not present

## 2020-05-19 DIAGNOSIS — Z86718 Personal history of other venous thrombosis and embolism: Secondary | ICD-10-CM | POA: Insufficient documentation

## 2020-05-19 DIAGNOSIS — E669 Obesity, unspecified: Secondary | ICD-10-CM | POA: Insufficient documentation

## 2020-05-19 DIAGNOSIS — Z9114 Patient's other noncompliance with medication regimen: Secondary | ICD-10-CM | POA: Diagnosis not present

## 2020-05-19 DIAGNOSIS — Z79899 Other long term (current) drug therapy: Secondary | ICD-10-CM | POA: Diagnosis not present

## 2020-05-19 NOTE — Patient Instructions (Addendum)
It was a pleasure seeing you today! ? ?MEDICATIONS: ?-No medication changes today ?-Call if you have questions about your medications. ? ? ?NEXT APPOINTMENT: ?Return to clinic in 2 months with Dr. McLean. ? ?In general, to take care of your heart failure: ?-Limit your fluid intake to 2 Liters (half-gallon) per day.   ?-Limit your salt intake to ideally 2-3 grams (2000-3000 mg) per day. ?-Weigh yourself daily and record, and bring that "weight diary" to your next appointment.  (Weight gain of 2-3 pounds in 1 day typically means fluid weight.) ?-The medications for your heart are to help your heart and help you live longer.   ?-Please contact us before stopping any of your heart medications. ? ?Call the clinic at [phone] with questions or to reschedule future appointments.  ?

## 2020-05-20 ENCOUNTER — Ambulatory Visit (INDEPENDENT_AMBULATORY_CARE_PROVIDER_SITE_OTHER): Payer: Medicaid Other | Admitting: Primary Care

## 2020-05-20 ENCOUNTER — Encounter (INDEPENDENT_AMBULATORY_CARE_PROVIDER_SITE_OTHER): Payer: Self-pay | Admitting: Primary Care

## 2020-05-20 ENCOUNTER — Other Ambulatory Visit (HOSPITAL_COMMUNITY)
Admission: RE | Admit: 2020-05-20 | Discharge: 2020-05-20 | Disposition: A | Discharge status: Home / Self Care | Payer: Medicaid Other | Source: Ambulatory Visit | Attending: Primary Care | Admitting: Primary Care

## 2020-05-20 ENCOUNTER — Other Ambulatory Visit: Payer: Self-pay

## 2020-05-20 VITALS — BP 112/70 | HR 74 | Temp 97.5°F | Ht 61.0 in | Wt 202.6 lb

## 2020-05-20 DIAGNOSIS — Z113 Encounter for screening for infections with a predominantly sexual mode of transmission: Secondary | ICD-10-CM | POA: Insufficient documentation

## 2020-05-20 DIAGNOSIS — Z23 Encounter for immunization: Secondary | ICD-10-CM | POA: Diagnosis not present

## 2020-05-20 DIAGNOSIS — Z124 Encounter for screening for malignant neoplasm of cervix: Secondary | ICD-10-CM | POA: Insufficient documentation

## 2020-05-20 DIAGNOSIS — J4540 Moderate persistent asthma, uncomplicated: Secondary | ICD-10-CM

## 2020-05-20 DIAGNOSIS — Z01419 Encounter for gynecological examination (general) (routine) without abnormal findings: Secondary | ICD-10-CM | POA: Diagnosis not present

## 2020-05-20 MED ORDER — MONTELUKAST SODIUM 10 MG PO TABS: 10.0000 mg | ORAL_TABLET | Freq: Every day | ORAL | 3 refills | Status: DC

## 2020-05-20 MED ORDER — ASMANEX (60 METERED DOSES) 220 MCG/INH IN AEPB
2.0000 | INHALATION_SPRAY | Freq: Two times a day (BID) | RESPIRATORY_TRACT | 12 refills | Status: DC
Start: 2020-05-20 — End: 2020-06-05

## 2020-05-20 NOTE — Progress Notes (Signed)
Subjective:     Sheila Estes is a 29 y.o. female and is here for a comprehensive gyn exam. The patient reports. No menstrual cycle since June- promises she not pregnant . She is graduating in May and over [place] has to study for [employer] for law school stressors.   Social History   Socioeconomic History  . Marital status: Single    Spouse name: Not on file  . Number of children: Not on file  . Years of education: Not on file  . Highest education level: Not on file  Occupational History  . Not on file  Tobacco Use  . Smoking status: Never Smoker  . Smokeless tobacco: Never Used  Vaping Use  . Vaping Use: Never used  Substance and Sexual Activity  . Alcohol use: Yes    Comment: socially wine  . Drug use: No  . Sexual activity: Yes    Birth control/protection: Condom  Other Topics Concern  . Not on file  Social History Narrative  . Not on file   Social Determinants of Health   Financial Resource Strain:   . Difficulty of Paying Living Expenses: Not on file  Food Insecurity:   . Worried About Programme researcher, broadcasting/film/video in the Last Year: Not on file  . Ran Out of Food in the Last Year: Not on file  Transportation Needs:   . Lack of Transportation (Medical): Not on file  . Lack of Transportation (Non-Medical): Not on file  Physical Activity:   . Days of Exercise per Week: Not on file  . Minutes of Exercise per Session: Not on file  Stress:   . Feeling of Stress : Not on file  Social Connections:   . Frequency of Communication with Friends and Family: Not on file  . Frequency of Social Gatherings with Friends and Family: Not on file  . Attends Religious Services: Not on file  . Active Member of Clubs or Organizations: Not on file  . Attends Banker Meetings: Not on file  . Marital Status: Not on file  Intimate Partner Violence:   . Fear of Current or Ex-Partner: Not on file  . Emotionally Abused: Not on file  . Physically Abused: Not on file  . Sexually Abused:  Not on file   Health Maintenance  Topic Date Due  . Hepatitis C Screening  Never done  . TETANUS/TDAP  Never done  . PAP SMEAR-Modifier  04/08/2019  . INFLUENZA VACCINE  Completed  . COVID-19 Vaccine  Completed  . HIV Screening  Completed    The following portions of the patient's history were reviewed and updated as appropriate: allergies, current medications, past family history, past medical history, past social history, past surgical history and problem list.  Review of Systems Pertinent items noted in HPI and remainder of comprehensive ROS otherwise negative.   Objective:  BP 112/70 (BP Location: Right Arm, Patient Position: Sitting, Cuff Size: Normal)   Pulse 74   Temp (!) 97.5 F (36.4 C) (Temporal)   Ht 5\' 1"  (1.549 m)   Wt 202 lb 9.6 oz (91.9 kg)   LMP 10/19/2019   SpO2 96%   BMI 38.28 kg/m  .  Assessment:  CONSTITUTIONAL: Well-developed, well-nourished obese female in no acute distress.  HENT:  Normocephalic, atraumatic, External right and left ear normal. Oropharynx is clear and moist EYES: Conjunctivae and EOM are normal. Pupils are equal, round, and reactive to light. No scleral icterus.  NECK: Normal range of motion, supple, no  masses.  Normal thyroid.  SKIN: Skin is warm and dry. No rash noted. Not diaphoretic. No erythema. No pallor. NEUROLGIC: Alert and oriented to person, place, and time. Normal reflexes, muscle tone coordination. No cranial nerve deficit noted. PSYCHIATRIC: Normal mood and affect. Normal behavior. Normal judgment and thought content. CARDIOVASCULAR: Normal heart rate noted, regular rhythm RESPIRATORY wheezing a/p lateral . BREASTS: Taught SBE. ABDOMEN: Soft, normal bowel sounds, no distention noted.  No tenderness, rebound or guarding.  PELVIC: Normal appearing external genitalia; normal appearing vaginal mucosa and cervix.  No abnormal discharge noted.  Pap smear obtained.  Normal uterine size, no other palpable masses, no uterine or  adnexal tenderness. MUSCULOSKELETAL: Normal range of motion. No tenderness.  No cyanosis, clubbing, or edema.  2+ distal pulses.  Plan:  Sheila Estes was seen today for gynecologic exam.  Diagnoses and all orders for this visit:  Need for Tdap vaccination -     Tdap vaccine greater than or equal to 7yo IM  Cervical cancer screening -     Cytology - PAP([place])  Screening examination for STD (sexually transmitted disease) -     Cervicovaginal ancillary only  Moderate persistent asthma without complication On exam wheezing in all 4 quadrants not managed only on SABA. Prescribed singular and LABA for maintenance. If no improvement refer to pulmonology. States had asthma since she was 3 felt it was just part of life   See After Visit Summary for Counseling Recommendations

## 2020-05-20 NOTE — Patient Instructions (Signed)

## 2020-05-20 NOTE — Progress Notes (Deleted)
  Subjective:     Sheila Estes is a 29 y.o. female and is here for a comprehensive physical exam. The patient reports {problems:16946}.  Social History   Socioeconomic History  . Marital status: Single    Spouse name: Not on file  . Number of children: Not on file  . Years of education: Not on file  . Highest education level: Not on file  Occupational History  . Not on file  Tobacco Use  . Smoking status: Never Smoker  . Smokeless tobacco: Never Used  Vaping Use  . Vaping Use: Never used  Substance and Sexual Activity  . Alcohol use: Yes    Comment: socially wine  . Drug use: No  . Sexual activity: Yes    Birth control/protection: Condom  Other Topics Concern  . Not on file  Social History Narrative  . Not on file   Social Determinants of Health   Financial Resource Strain:   . Difficulty of Paying Living Expenses: Not on file  Food Insecurity:   . Worried About Programme researcher, broadcasting/film/video in the Last Year: Not on file  . Ran Out of Food in the Last Year: Not on file  Transportation Needs:   . Lack of Transportation (Medical): Not on file  . Lack of Transportation (Non-Medical): Not on file  Physical Activity:   . Days of Exercise per Week: Not on file  . Minutes of Exercise per Session: Not on file  Stress:   . Feeling of Stress : Not on file  Social Connections:   . Frequency of Communication with Friends and Family: Not on file  . Frequency of Social Gatherings with Friends and Family: Not on file  . Attends Religious Services: Not on file  . Active Member of Clubs or Organizations: Not on file  . Attends Banker Meetings: Not on file  . Marital Status: Not on file  Intimate Partner Violence:   . Fear of Current or Ex-Partner: Not on file  . Emotionally Abused: Not on file  . Physically Abused: Not on file  . Sexually Abused: Not on file   Health Maintenance  Topic Date Due  . Hepatitis C Screening  Never done  . TETANUS/TDAP  Never done  . PAP  SMEAR-Modifier  04/08/2019  . INFLUENZA VACCINE  Completed  . COVID-19 Vaccine  Completed  . HIV Screening  Completed    {Common ambulatory SmartLinks:19316}  Review of Systems {ros; complete:30496}   Objective:    {Exam, Complete:760-160-0188}    Assessment:    Healthy female exam. ***     Plan:     See After Visit Summary for Counseling Recommendations

## 2020-05-21 ENCOUNTER — Ambulatory Visit (INDEPENDENT_AMBULATORY_CARE_PROVIDER_SITE_OTHER): Payer: Medicaid Other

## 2020-05-21 DIAGNOSIS — I236 Thrombosis of atrium, auricular appendage, and ventricle as current complications following acute myocardial infarction: Secondary | ICD-10-CM

## 2020-05-21 DIAGNOSIS — Z5181 Encounter for therapeutic drug level monitoring: Secondary | ICD-10-CM

## 2020-05-21 LAB — CERVICOVAGINAL ANCILLARY ONLY
Bacterial Vaginitis (gardnerella): POSITIVE — AB
Candida Glabrata: NEGATIVE
Candida Vaginitis: NEGATIVE
Chlamydia: NEGATIVE
Comment: NEGATIVE
Comment: NEGATIVE
Comment: NEGATIVE
Comment: NEGATIVE
Comment: NEGATIVE
Comment: NORMAL
Neisseria Gonorrhea: NEGATIVE
Trichomonas: NEGATIVE

## 2020-05-21 LAB — POCT INR: INR: 2.1 (ref 2.0–3.0)

## 2020-05-21 NOTE — Patient Instructions (Signed)
Description   Continue on same dosage of Warfarin 2 tablets daily except for 1 tablet on Mondays, Wednesdays, and Fridays. Recheck in 2 weeks. Annette Stable [id number] for traditional Medicaid* Please call us with any questions or new medications. Coumadin Clinic [phone] Main [phone]

## 2020-05-22 ENCOUNTER — Other Ambulatory Visit (INDEPENDENT_AMBULATORY_CARE_PROVIDER_SITE_OTHER): Payer: Self-pay | Admitting: Primary Care

## 2020-05-22 DIAGNOSIS — N760 Acute vaginitis: Secondary | ICD-10-CM

## 2020-05-22 DIAGNOSIS — B9689 Other specified bacterial agents as the cause of diseases classified elsewhere: Secondary | ICD-10-CM

## 2020-05-22 MED ORDER — METRONIDAZOLE 500 MG PO TABS: 500.0000 mg | ORAL_TABLET | Freq: Two times a day (BID) | ORAL | 0 refills | Status: DC

## 2020-05-23 LAB — CYTOLOGY - PAP
Adequacy: ABSENT
Diagnosis: NEGATIVE

## 2020-05-28 ENCOUNTER — Telehealth (INDEPENDENT_AMBULATORY_CARE_PROVIDER_SITE_OTHER): Payer: Self-pay | Admitting: Primary Care

## 2020-05-28 NOTE — Telephone Encounter (Signed)
Patient called to get a PA for her medication for mometasone (ASMANEX, 60 METERED DOSES,) 220 MCG/INH inhaler. Please call patient to discuss at [phone]

## 2020-06-02 NOTE — Telephone Encounter (Signed)
PT calling back to F/UP  

## 2020-06-03 NOTE — Telephone Encounter (Signed)
Sent for PA or recc. Any LABA for asthma control

## 2020-06-04 ENCOUNTER — Other Ambulatory Visit: Payer: Self-pay

## 2020-06-04 ENCOUNTER — Ambulatory Visit (INDEPENDENT_AMBULATORY_CARE_PROVIDER_SITE_OTHER): Payer: Medicaid Other | Admitting: *Deleted

## 2020-06-04 DIAGNOSIS — Z5181 Encounter for therapeutic drug level monitoring: Secondary | ICD-10-CM | POA: Diagnosis not present

## 2020-06-04 DIAGNOSIS — I236 Thrombosis of atrium, auricular appendage, and ventricle as current complications following acute myocardial infarction: Secondary | ICD-10-CM | POA: Diagnosis not present

## 2020-06-04 LAB — POCT INR: INR: 1.3 — AB (ref 2.0–3.0)

## 2020-06-04 NOTE — Patient Instructions (Addendum)
Description   Today take 1.5 tablets and 2.5 tablets tomorrow then continue taking Warfarin 2 tablets daily except for 1 tablet on Mondays, Wednesdays, and Fridays. Recheck in 1 week. Annette Stable [id number] for traditional Medicaid* Please call us with any questions or new medications. Coumadin Clinic [phone] Main [phone]

## 2020-06-05 ENCOUNTER — Telehealth: Payer: Self-pay

## 2020-06-05 ENCOUNTER — Other Ambulatory Visit (INDEPENDENT_AMBULATORY_CARE_PROVIDER_SITE_OTHER): Payer: Self-pay | Admitting: Primary Care

## 2020-06-05 DIAGNOSIS — J4540 Moderate persistent asthma, uncomplicated: Secondary | ICD-10-CM

## 2020-06-05 MED ORDER — ALBUTEROL SULFATE HFA 108 (90 BASE) MCG/ACT IN AERS: 2.0000 | INHALATION_SPRAY | Freq: Four times a day (QID) | RESPIRATORY_TRACT | 2 refills | Status: DC | PRN

## 2020-06-05 MED ORDER — FLOVENT HFA 110 MCG/ACT IN AERO
2.0000 | INHALATION_SPRAY | Freq: Two times a day (BID) | RESPIRATORY_TRACT | 12 refills | Status: DC
Start: 2020-06-05 — End: 2023-08-19

## 2020-06-05 NOTE — Telephone Encounter (Signed)
If appropriate, can you send in a new script for either Flovent or Pulmicort to [facility] on [address] to replace the Asmanex?  Flovent and Pumicort are preferred under her ins.

## 2020-06-17 ENCOUNTER — Other Ambulatory Visit: Payer: Self-pay

## 2020-06-17 ENCOUNTER — Ambulatory Visit (INDEPENDENT_AMBULATORY_CARE_PROVIDER_SITE_OTHER): Payer: Medicaid Other | Admitting: *Deleted

## 2020-06-17 DIAGNOSIS — Z5181 Encounter for therapeutic drug level monitoring: Secondary | ICD-10-CM

## 2020-06-17 DIAGNOSIS — I236 Thrombosis of atrium, auricular appendage, and ventricle as current complications following acute myocardial infarction: Secondary | ICD-10-CM

## 2020-06-17 LAB — POCT INR: INR: 2.6 (ref 2.0–3.0)

## 2020-06-17 NOTE — Patient Instructions (Signed)
Description   Tomorrow take 2 tablets (since you took 1 tablet Sunday) then continue taking Warfarin 2 tablets daily except for 1 tablet on Mondays, Wednesdays, and Fridays. Recheck in 1 week. Annette Stable [id number] for traditional Medicaid* Please call us with any questions or new medications. [facility] [phone] Main [phone]

## 2020-07-01 ENCOUNTER — Other Ambulatory Visit: Payer: Self-pay

## 2020-07-01 ENCOUNTER — Ambulatory Visit (INDEPENDENT_AMBULATORY_CARE_PROVIDER_SITE_OTHER): Payer: Medicaid Other

## 2020-07-01 DIAGNOSIS — I236 Thrombosis of atrium, auricular appendage, and ventricle as current complications following acute myocardial infarction: Secondary | ICD-10-CM

## 2020-07-01 DIAGNOSIS — Z5181 Encounter for therapeutic drug level monitoring: Secondary | ICD-10-CM

## 2020-07-01 LAB — POCT INR: INR: 2.1 (ref 2.0–3.0)

## 2020-07-01 NOTE — Patient Instructions (Signed)
Description   Continue on same dosage 1.5 tablets daily except 2 tablets on Tuesdays, Thursdays and Sundays.  Recheck in 2 weeks. Annette Stable [id number] for traditional Medicaid* Please call us with any questions or new medications. [facility] [phone] Main [phone]

## 2020-07-17 ENCOUNTER — Encounter (HOSPITAL_COMMUNITY): Payer: Medicaid Other | Admitting: Cardiology

## 2020-07-30 ENCOUNTER — Telehealth: Payer: Self-pay | Admitting: *Deleted

## 2020-07-30 NOTE — Telephone Encounter (Signed)
Called pt since she is overdue for her Anticoagulation Appt; set an appt for 08/05/20 per pt request since she is working again.

## 2020-08-04 ENCOUNTER — Ambulatory Visit (INDEPENDENT_AMBULATORY_CARE_PROVIDER_SITE_OTHER): Payer: Medicaid Other

## 2020-08-04 ENCOUNTER — Other Ambulatory Visit: Payer: Self-pay

## 2020-08-04 ENCOUNTER — Other Ambulatory Visit (HOSPITAL_COMMUNITY): Payer: Self-pay

## 2020-08-04 DIAGNOSIS — I236 Thrombosis of atrium, auricular appendage, and ventricle as current complications following acute myocardial infarction: Secondary | ICD-10-CM | POA: Diagnosis not present

## 2020-08-04 DIAGNOSIS — Z5181 Encounter for therapeutic drug level monitoring: Secondary | ICD-10-CM | POA: Diagnosis not present

## 2020-08-04 LAB — POCT INR: INR: 1.5 — AB (ref 2.0–3.0)

## 2020-08-04 MED ORDER — CARVEDILOL 6.25 MG PO TABS: 6.2500 mg | ORAL_TABLET | Freq: Two times a day (BID) | ORAL | 3 refills | Status: DC

## 2020-08-04 NOTE — Patient Instructions (Signed)
Description   Since you've already taken 2 tablets today, continue on same dosage 1.5 tablets daily except 2 tablets on Tuesdays, Thursdays and Sundays.  Recheck in 1 week. Annette Stable [id number] for traditional Medicaid* Please call us with any questions or new medications. [facility] [phone] Main [phone]

## 2020-08-07 ENCOUNTER — Encounter (HOSPITAL_COMMUNITY): Payer: Medicaid Other | Admitting: Cardiology

## 2020-08-11 ENCOUNTER — Ambulatory Visit (INDEPENDENT_AMBULATORY_CARE_PROVIDER_SITE_OTHER): Payer: Medicaid Other

## 2020-08-11 ENCOUNTER — Other Ambulatory Visit: Payer: Self-pay

## 2020-08-11 DIAGNOSIS — I236 Thrombosis of atrium, auricular appendage, and ventricle as current complications following acute myocardial infarction: Secondary | ICD-10-CM | POA: Diagnosis not present

## 2020-08-11 DIAGNOSIS — Z5181 Encounter for therapeutic drug level monitoring: Secondary | ICD-10-CM | POA: Diagnosis not present

## 2020-08-11 LAB — POCT INR: INR: 1.7 — AB (ref 2.0–3.0)

## 2020-08-11 NOTE — Patient Instructions (Signed)
-   take 2 tablets tonight, then  - continue on same dosage 1.5 tablets daily except 2 tablets on Tuesdays, Thursdays and Sundays.   - Recheck in 1 week. Annette Stable [id number] for traditional Medicaid* Please call us with any questions or new medications. [facility] [phone] Main [phone]

## 2020-08-19 ENCOUNTER — Other Ambulatory Visit: Payer: Self-pay

## 2020-08-19 ENCOUNTER — Ambulatory Visit (INDEPENDENT_AMBULATORY_CARE_PROVIDER_SITE_OTHER): Payer: Medicaid Other | Admitting: Pharmacist

## 2020-08-19 DIAGNOSIS — I236 Thrombosis of atrium, auricular appendage, and ventricle as current complications following acute myocardial infarction: Secondary | ICD-10-CM

## 2020-08-19 DIAGNOSIS — Z5181 Encounter for therapeutic drug level monitoring: Secondary | ICD-10-CM

## 2020-08-19 LAB — POCT INR: INR: 1.7 — AB (ref 2.0–3.0)

## 2020-08-19 NOTE — Patient Instructions (Signed)
Take an extra half a tablet when you get home then start taking 2 tablets daily except 1 tablet on Mon, Wed and Fri. - Recheck in 1 week.  Please call us with any questions or new medications. [facility] [phone] Main [phone]

## 2020-08-20 ENCOUNTER — Ambulatory Visit (INDEPENDENT_AMBULATORY_CARE_PROVIDER_SITE_OTHER): Payer: Medicaid Other | Admitting: Primary Care

## 2020-08-26 ENCOUNTER — Ambulatory Visit (INDEPENDENT_AMBULATORY_CARE_PROVIDER_SITE_OTHER): Payer: Medicaid Other | Admitting: *Deleted

## 2020-08-26 ENCOUNTER — Other Ambulatory Visit: Payer: Self-pay

## 2020-08-26 DIAGNOSIS — Z5181 Encounter for therapeutic drug level monitoring: Secondary | ICD-10-CM | POA: Diagnosis not present

## 2020-08-26 DIAGNOSIS — I236 Thrombosis of atrium, auricular appendage, and ventricle as current complications following acute myocardial infarction: Secondary | ICD-10-CM

## 2020-08-26 LAB — POCT INR: INR: 1.4 — AB (ref 2.0–3.0)

## 2020-08-26 NOTE — Patient Instructions (Signed)
Description   Today take 2.5 tablets and tomorrow take 1.5 tablets then start taking the dose you should be taking, which is, 2 tablets daily except 1 tablet on Monday, Wednesday, and Friday. Recheck in 1 week. Sheila Estes Stable [id number] for traditional Medicaid*  Please call us with any questions or new medications. [facility] [phone] Main [phone]

## 2020-09-01 ENCOUNTER — Other Ambulatory Visit: Payer: Self-pay | Admitting: Cardiology

## 2020-09-02 ENCOUNTER — Other Ambulatory Visit: Payer: Self-pay

## 2020-09-02 ENCOUNTER — Ambulatory Visit (INDEPENDENT_AMBULATORY_CARE_PROVIDER_SITE_OTHER): Payer: Medicaid Other | Admitting: *Deleted

## 2020-09-02 DIAGNOSIS — Z5181 Encounter for therapeutic drug level monitoring: Secondary | ICD-10-CM | POA: Diagnosis not present

## 2020-09-02 DIAGNOSIS — I236 Thrombosis of atrium, auricular appendage, and ventricle as current complications following acute myocardial infarction: Secondary | ICD-10-CM

## 2020-09-02 LAB — POCT INR: INR: 1.5 — AB (ref 2.0–3.0)

## 2020-09-02 MED ORDER — WARFARIN SODIUM 5 MG PO TABS: ORAL_TABLET | ORAL | 1 refills | Status: DC

## 2020-09-02 NOTE — Patient Instructions (Addendum)
Description   Since you took an extra tablet yesterday, today take 2.5 tablets today then start taking 2 tablets daily except 1 tablet on Friday. Recheck in 1 week. Annette Stable [id number] for traditional Medicaid*  Please call us with any questions or new medications. [facility] [phone] Main [phone]

## 2020-09-09 ENCOUNTER — Other Ambulatory Visit: Payer: Self-pay

## 2020-09-09 ENCOUNTER — Ambulatory Visit (INDEPENDENT_AMBULATORY_CARE_PROVIDER_SITE_OTHER): Payer: Medicaid Other | Admitting: *Deleted

## 2020-09-09 DIAGNOSIS — I236 Thrombosis of atrium, auricular appendage, and ventricle as current complications following acute myocardial infarction: Secondary | ICD-10-CM | POA: Diagnosis not present

## 2020-09-09 DIAGNOSIS — Z5181 Encounter for therapeutic drug level monitoring: Secondary | ICD-10-CM

## 2020-09-09 LAB — POCT INR: INR: 1.6 — AB (ref 2.0–3.0)

## 2020-09-09 NOTE — Patient Instructions (Addendum)
Description   Today and tomorrow take 2.5 tablets of Warfarin then start taking 2 tablets daily. Recheck in 1 week. Annette Stable [id number] for traditional Medicaid*  Please call us with any questions or new medications. [facility] [phone] Main [phone]

## 2020-09-17 ENCOUNTER — Other Ambulatory Visit: Payer: Self-pay

## 2020-09-17 ENCOUNTER — Ambulatory Visit (INDEPENDENT_AMBULATORY_CARE_PROVIDER_SITE_OTHER): Payer: Medicaid Other

## 2020-09-17 DIAGNOSIS — I236 Thrombosis of atrium, auricular appendage, and ventricle as current complications following acute myocardial infarction: Secondary | ICD-10-CM

## 2020-09-17 DIAGNOSIS — Z5181 Encounter for therapeutic drug level monitoring: Secondary | ICD-10-CM | POA: Diagnosis not present

## 2020-09-17 LAB — POCT INR: INR: 2.6 (ref 2.0–3.0)

## 2020-09-17 NOTE — Patient Instructions (Signed)
Description   Continue on same dosage 2 tablets daily. Recheck in 2 weeks. Annette Stable [id number] for traditional Medicaid*  Please call us with any questions or new medications. [facility] [phone] Main [phone]

## 2020-09-29 ENCOUNTER — Telehealth (HOSPITAL_COMMUNITY): Payer: Self-pay | Admitting: *Deleted

## 2020-09-29 NOTE — Telephone Encounter (Signed)
Pt left vm requesting return call . I called pt back no answer/no vm set up.

## 2020-10-02 ENCOUNTER — Telehealth (HOSPITAL_COMMUNITY): Payer: Self-pay | Admitting: Cardiology

## 2020-10-02 NOTE — Telephone Encounter (Signed)
Ok for HR up to around 140 bpm to start with.

## 2020-10-02 NOTE — Telephone Encounter (Signed)
Pt called to request updated handicap sticker -form left for provider to review  Also pt called to request SAFE HEART RATE ZONE while working out. Report she will be starting with a trainer soon and wants to have that information so working out could begin .

## 2020-10-03 NOTE — Telephone Encounter (Signed)
Called and spoke with pt she is aware and thanked me for the call.

## 2020-10-07 ENCOUNTER — Other Ambulatory Visit: Payer: Self-pay

## 2020-10-07 ENCOUNTER — Ambulatory Visit (INDEPENDENT_AMBULATORY_CARE_PROVIDER_SITE_OTHER): Payer: Medicaid Other

## 2020-10-07 DIAGNOSIS — Z5181 Encounter for therapeutic drug level monitoring: Secondary | ICD-10-CM

## 2020-10-07 DIAGNOSIS — I236 Thrombosis of atrium, auricular appendage, and ventricle as current complications following acute myocardial infarction: Secondary | ICD-10-CM

## 2020-10-07 LAB — POCT INR: INR: 2.3 (ref 2.0–3.0)

## 2020-10-07 NOTE — Patient Instructions (Signed)
Description   Continue on same dosage 2 tablets daily. Recheck in 3 weeks. *Bill [id number] for traditional Medicaid*  Please call us with any questions or new medications. [facility] [phone] Main [phone]     

## 2020-10-28 ENCOUNTER — Ambulatory Visit (INDEPENDENT_AMBULATORY_CARE_PROVIDER_SITE_OTHER): Payer: Medicaid Other

## 2020-10-28 ENCOUNTER — Other Ambulatory Visit: Payer: Self-pay

## 2020-10-28 DIAGNOSIS — Z5181 Encounter for therapeutic drug level monitoring: Secondary | ICD-10-CM

## 2020-10-28 DIAGNOSIS — I236 Thrombosis of atrium, auricular appendage, and ventricle as current complications following acute myocardial infarction: Secondary | ICD-10-CM

## 2020-10-28 LAB — POCT INR: INR: 2.1 (ref 2.0–3.0)

## 2020-10-28 NOTE — Patient Instructions (Signed)
Description   Continue on same dosage 2 tablets daily. Recheck in 3 weeks. Annette Stable [id number] for traditional Medicaid*  Please call us with any questions or new medications. [facility] [phone] Main [phone]

## 2020-11-03 ENCOUNTER — Other Ambulatory Visit (HOSPITAL_COMMUNITY): Payer: Self-pay | Admitting: Cardiology

## 2020-11-03 DIAGNOSIS — I5022 Chronic systolic (congestive) heart failure: Secondary | ICD-10-CM

## 2020-11-05 NOTE — Telephone Encounter (Signed)
This is a CHF pt 

## 2020-11-06 ENCOUNTER — Other Ambulatory Visit: Payer: Self-pay

## 2020-11-06 ENCOUNTER — Encounter (HOSPITAL_COMMUNITY): Payer: Self-pay | Admitting: Cardiology

## 2020-11-06 ENCOUNTER — Ambulatory Visit (HOSPITAL_COMMUNITY)
Admission: RE | Admit: 2020-11-06 | Discharge: 2020-11-06 | Disposition: A | Discharge status: Home / Self Care | Payer: Medicaid Other | Source: Ambulatory Visit | Attending: Cardiology | Admitting: Cardiology

## 2020-11-06 VITALS — BP 110/70 | HR 85 | Wt 222.2 lb

## 2020-11-06 DIAGNOSIS — E669 Obesity, unspecified: Secondary | ICD-10-CM | POA: Insufficient documentation

## 2020-11-06 DIAGNOSIS — F32A Depression, unspecified: Secondary | ICD-10-CM | POA: Insufficient documentation

## 2020-11-06 DIAGNOSIS — I5022 Chronic systolic (congestive) heart failure: Secondary | ICD-10-CM | POA: Insufficient documentation

## 2020-11-06 DIAGNOSIS — Z6841 Body Mass Index (BMI) 40.0 and over, adult: Secondary | ICD-10-CM | POA: Diagnosis not present

## 2020-11-06 DIAGNOSIS — Z8249 Family history of ischemic heart disease and other diseases of the circulatory system: Secondary | ICD-10-CM | POA: Diagnosis not present

## 2020-11-06 DIAGNOSIS — Z79899 Other long term (current) drug therapy: Secondary | ICD-10-CM | POA: Insufficient documentation

## 2020-11-06 DIAGNOSIS — Z7951 Long term (current) use of inhaled steroids: Secondary | ICD-10-CM | POA: Insufficient documentation

## 2020-11-06 DIAGNOSIS — Z7901 Long term (current) use of anticoagulants: Secondary | ICD-10-CM | POA: Diagnosis not present

## 2020-11-06 DIAGNOSIS — J45909 Unspecified asthma, uncomplicated: Secondary | ICD-10-CM | POA: Insufficient documentation

## 2020-11-06 DIAGNOSIS — F319 Bipolar disorder, unspecified: Secondary | ICD-10-CM | POA: Diagnosis not present

## 2020-11-06 DIAGNOSIS — I2510 Atherosclerotic heart disease of native coronary artery without angina pectoris: Secondary | ICD-10-CM | POA: Diagnosis not present

## 2020-11-06 DIAGNOSIS — I513 Intracardiac thrombosis, not elsewhere classified: Secondary | ICD-10-CM | POA: Diagnosis not present

## 2020-11-06 DIAGNOSIS — Z7984 Long term (current) use of oral hypoglycemic drugs: Secondary | ICD-10-CM | POA: Diagnosis not present

## 2020-11-06 DIAGNOSIS — F419 Anxiety disorder, unspecified: Secondary | ICD-10-CM | POA: Diagnosis not present

## 2020-11-06 HISTORY — DX: Heart failure, unspecified: I50.9

## 2020-11-06 LAB — BASIC METABOLIC PANEL
Anion gap: 8 (ref 5–15)
BUN: 14 mg/dL (ref 6–20)
CO2: 25 mmol/L (ref 22–32)
Calcium: 9.1 mg/dL (ref 8.9–10.3)
Chloride: 105 mmol/L (ref 98–111)
Creatinine, Ser: 0.76 mg/dL (ref 0.44–1.00)
GFR, Estimated: >60 mL/min (ref >60)
Glucose, Bld: 83 mg/dL (ref 70–99)
Potassium: 3.7 mmol/L (ref 3.5–5.1)
Sodium: 138 mmol/L (ref 135–145)

## 2020-11-06 LAB — BRAIN NATRIURETIC PEPTIDE: B Natriuretic Peptide: 432.8 pg/mL — ABNORMAL HIGH (ref 0.0–100.0)

## 2020-11-06 MED ORDER — DAPAGLIFLOZIN PROPANEDIOL 10 MG PO TABS: 10.0000 mg | ORAL_TABLET | Freq: Every day | ORAL | 3 refills | Status: DC

## 2020-11-06 MED ORDER — CARVEDILOL 12.5 MG PO TABS: 12.5000 mg | ORAL_TABLET | Freq: Two times a day (BID) | ORAL | 3 refills | Status: DC

## 2020-11-06 MED ORDER — FUROSEMIDE 20 MG PO TABS: 20.0000 mg | ORAL_TABLET | Freq: Every day | ORAL | 3 refills | Status: DC

## 2020-11-06 MED ORDER — SPIRONOLACTONE 25 MG PO TABS: 25.0000 mg | ORAL_TABLET | Freq: Every day | ORAL | 3 refills | Status: DC

## 2020-11-06 MED ORDER — POTASSIUM CHLORIDE CRYS ER 20 MEQ PO TBCR: 20.0000 meq | EXTENDED_RELEASE_TABLET | Freq: Every day | ORAL | 3 refills | Status: DC

## 2020-11-06 MED ORDER — ATORVASTATIN CALCIUM 80 MG PO TABS: 80.0000 mg | ORAL_TABLET | Freq: Every day | ORAL | 3 refills | Status: DC

## 2020-11-06 NOTE — Patient Instructions (Signed)
Labs done today. We will contact you only if your labs are abnormal.  INCREASE Lasix to 20mg  (1 tablet) by mouth daily.  INCREASE Carvedilol to 12.5mg  (1 tablet) by mouth 2 times daily.   No other medication changes were made. Please continue all current medications as prescribed.  Your physician recommends that you schedule a follow-up appointment in: 10 days for a lab only appointment, 1 month for a clinic pharmacist appointment here in our office and in 3 months for an appointment with Dr. with an echo prior to your exam.  Your physician has requested that you have an echocardiogram. Echocardiography is a painless test that uses sound waves to create images of your heart. It provides your doctor with information about the size and shape of your heart and how well your heart's chambers and valves are working. This procedure takes approximately one hour. There are no restrictions for this procedure.  If you have any questions or concerns before your next appointment please send Shirlee Latch a message through [place] or call our office at [phone].    TO LEAVE A MESSAGE FOR THE NURSE SELECT OPTION 2, PLEASE LEAVE A MESSAGE INCLUDING: . YOUR NAME . DATE OF BIRTH . CALL BACK NUMBER . REASON FOR CALL**this is important as we prioritize the call backs  YOU WILL RECEIVE A CALL BACK THE SAME DAY AS LONG AS YOU CALL BEFORE 4:00 PM   Do the following things EVERYDAY: 1) Weigh yourself in the morning before breakfast. Write it down and keep it in a log. 2) Take your medicines as prescribed 3) Eat low salt foods--Limit salt (sodium) to 2000 mg per day.  4) Stay as active as you can everyday 5) Limit all fluids for the day to less than 2 liters   At the [facility], you and your health needs are our priority. As part of our continuing mission to provide you with exceptional heart care, we have created designated Provider Care Teams. These Care Teams include your primary  Cardiologist (physician) and Advanced Practice Providers (APPs- Physician Assistants and Nurse Practitioners) who all work together to provide you with the care you need, when you need it.   You may see any of the following providers on your designated Care Team at your next follow up: [phone] Dr Marland Kitchen . Dr Arvilla Meres . Marca Ancona, NP . Tonye Becket, PA . Robbie Lis, PharmD   Please be sure to bring in all your medications bottles to every appointment.

## 2020-11-06 NOTE — Progress Notes (Signed)
ReDS Vest / Clip - 11/06/20 1600      ReDS Vest / Clip   Station Marker B    Ruler Value 37    ReDS Value Range Moderate volume overload    ReDS Actual Value 37

## 2020-11-07 ENCOUNTER — Emergency Department (HOSPITAL_BASED_OUTPATIENT_CLINIC_OR_DEPARTMENT_OTHER)
Admission: EM | Admit: 2020-11-07 | Discharge: 2020-11-07 | Disposition: A | Discharge status: Home / Self Care | Payer: Medicaid Other | Attending: Emergency Medicine | Admitting: Emergency Medicine

## 2020-11-07 ENCOUNTER — Emergency Department (HOSPITAL_BASED_OUTPATIENT_CLINIC_OR_DEPARTMENT_OTHER): Payer: Medicaid Other

## 2020-11-07 ENCOUNTER — Other Ambulatory Visit: Payer: Self-pay

## 2020-11-07 ENCOUNTER — Encounter (HOSPITAL_BASED_OUTPATIENT_CLINIC_OR_DEPARTMENT_OTHER): Payer: Self-pay

## 2020-11-07 DIAGNOSIS — Z79899 Other long term (current) drug therapy: Secondary | ICD-10-CM | POA: Diagnosis not present

## 2020-11-07 DIAGNOSIS — R0602 Shortness of breath: Secondary | ICD-10-CM | POA: Insufficient documentation

## 2020-11-07 DIAGNOSIS — Z7901 Long term (current) use of anticoagulants: Secondary | ICD-10-CM | POA: Insufficient documentation

## 2020-11-07 DIAGNOSIS — I5021 Acute systolic (congestive) heart failure: Secondary | ICD-10-CM | POA: Diagnosis not present

## 2020-11-07 DIAGNOSIS — I110 Hypertensive heart disease with heart failure: Secondary | ICD-10-CM | POA: Diagnosis not present

## 2020-11-07 DIAGNOSIS — Z7951 Long term (current) use of inhaled steroids: Secondary | ICD-10-CM | POA: Insufficient documentation

## 2020-11-07 DIAGNOSIS — J45909 Unspecified asthma, uncomplicated: Secondary | ICD-10-CM | POA: Insufficient documentation

## 2020-11-07 MED ORDER — IPRATROPIUM-ALBUTEROL 0.5-2.5 (3) MG/3ML IN SOLN
3.0000 mL | Freq: Once | RESPIRATORY_TRACT | Status: AC
  Administered 2020-11-07: 3 mL via RESPIRATORY_TRACT
  Filled 2020-11-07: qty 3

## 2020-11-07 MED ORDER — PREDNISONE 50 MG PO TABS: 60.0000 mg | ORAL_TABLET | Freq: Once | ORAL | Status: DC

## 2020-11-07 NOTE — ED Notes (Signed)
Pt dc ama. Pt states I feel all better now.

## 2020-11-07 NOTE — ED Notes (Signed)
Patient arrives for [facility]. Patient endorses a productive cough w/ yellow sputum. Patient BBS coarse/rh and exp wheezing. Patient O2 94% on RA. Patient w/ hx of cardiac and pulmonary diagnoses. Patient is on flovent and albuterol at home. Patient states she is taking albuterol inhaler "every 45 min". Patient feels she needs a breathing treatment, but that home resp meds are not effective.

## 2020-11-07 NOTE — ED Provider Notes (Signed)
[facility] EMERGENCY DEPT Provider Note   CSN: [id number] Arrival date & time: 11/07/20  1527     History Chief Complaint  Patient presents with  . Shortness of Breath    Sheila Estes is a 30 y.o. female.  Patient is a 30 year old female with a history of bipolar disorder, CHF, asthma and chronic Coumadin use.  She reports shortness of breath.  She says it started last night.  She has wheezing.  No chest pain.  She has a dry cough.  No known fevers.  No leg swelling.  She feels better if she sits up.  She saw her cardiologist yesterday who noted some increased fluid buildup by a LifeVest check.  She had her Lasix and Coreg increased.  She says that she feels like this is more of an asthma exacerbation.  She has been using her albuterol inhaler throughout the day with no improvement in symptoms.  She says that she only wants to get a nebulizer treatment and then be discharged.  She has to go get her daughter from school and does not want to have any further evaluation.        Past Medical History:  Diagnosis Date  . Abnormal vaginal Pap smear   . Acute systolic HF (heart failure) (HCC) 08/28/2018  . Anticoagulation goal of INR 2 to 3 09/03/2018  . Anxiety   . Asthma    HX - rarely uses inhaler - seasonal  . Bipolar 1 disorder (HCC)   . CHF (congestive heart failure) (HCC)   . Depression   . History of depression   . History of trichomoniasis   . Hypertension   . 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 Pneumonia    CHILDHOOD  . 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 Encounter for therapeutic drug monitoring 09/06/2018  . 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 Ischemic cardiomyopathy 09/03/2018  . 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 Anticoagulation goal of INR 2 to 3 09/03/2018  . Acute systolic HF (heart  failure) (HCC) 08/28/2018  . 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 Poor compliance 08/17/2016  . Abnormal Pap smear of cervix 08/17/2016  . BMI 40.0-44.9, adult (HCC) 08/17/2016    Past Surgical History:  Procedure Laterality Date  . ARTHROSCOPTIC SHOULDER Left 2007  . CERVICAL CONIZATION W/BX N/A 12/14/2016   Procedure: COLD KNIFE CONIZATION, REMOVAL OF BODY JEWELRY;  Surgeon: [place] Bing, MD;  Location: [facility];  Service: Gynecology;  Laterality: N/A;  . RIGHT/LEFT HEART CATH AND CORONARY ANGIOGRAPHY N/A 08/30/2018   Procedure: RIGHT/LEFT HEART CATH AND CORONARY ANGIOGRAPHY;  Surgeon: Laurey Morale, MD;  Location: [facility] INVASIVE CV LAB;  Service: Cardiovascular;  Laterality: N/A;     OB History    Gravida  1   Para  1   Term  1   Preterm      AB      Living  1     SAB      IAB      Ectopic      Multiple      Live Births  1        Obstetric Comments  SVD x 1        Family History  Problem Relation Age of Onset  . Hypertension Mother   . Hypertension Father   . Diabetes  Maternal Grandmother   . Hypertension Maternal Grandfather     Social History   Tobacco Use  . Smoking status: Never Smoker  . Smokeless tobacco: Never Used  Vaping Use  . Vaping Use: Never used  Substance Use Topics  . Alcohol use: Yes    Comment: socially wine  . Drug use: No    Home Medications Prior to Admission medications   Medication Sig Start Date End Date Taking? Authorizing Provider  albuterol (VENTOLIN HFA) 108 (90 Base) MCG/ACT inhaler Inhale 2 puffs into the lungs every 6 (six) hours as needed for wheezing or shortness of breath. 06/05/20   Grayce Sessions, NP  atorvastatin (LIPITOR) 80 MG tablet Take 1 tablet (80 mg total) by mouth daily at 6 PM. 11/06/20   Laurey Morale, MD  carvedilol (COREG) 12.5 MG tablet Take 1 tablet (12.5 mg total) by mouth 2 (two) times daily with a meal. 11/06/20   Laurey Morale, MD  dapagliflozin  propanediol (FARXIGA) 10 MG TABS tablet Take 1 tablet (10 mg total) by mouth daily before breakfast. 11/06/20   Laurey Morale, MD  ENTRESTO 49-51 MG TAKE 1 TABLET BY MOUTH TWICE DAILY 11/06/20   Bensimhon, Bevelyn Buckles, MD  fluticasone (FLOVENT HFA) 110 MCG/ACT inhaler Inhale 2 puffs into the lungs 2 (two) times daily at 10 AM and 5 PM. 06/05/20   Grayce Sessions, NP  furosemide (LASIX) 20 MG tablet Take 1 tablet (20 mg total) by mouth daily. 11/06/20   Laurey Morale, MD  potassium chloride SA (KLOR-CON) 20 MEQ tablet Take 1 tablet (20 mEq total) by mouth daily. With every LASIX dose 11/06/20   Laurey Morale, MD  spironolactone (ALDACTONE) 25 MG tablet Take 1 tablet (25 mg total) by mouth at bedtime. 11/06/20   Laurey Morale, MD  warfarin (COUMADIN) 5 MG tablet Take as directed by Anticoagulation Clinic. 09/02/20   Laurey Morale, MD    Allergies    Other and Gluten meal  Review of Systems   Review of Systems  Constitutional: Negative for chills, diaphoresis, fatigue and fever.  HENT: Negative for congestion, rhinorrhea and sneezing.   Eyes: Negative.   Respiratory: Positive for cough, shortness of breath and wheezing. Negative for chest tightness.   Cardiovascular: Negative for chest pain and leg swelling.  Gastrointestinal: Negative for abdominal pain, blood in stool, diarrhea, nausea and vomiting.  Genitourinary: Negative for difficulty urinating, flank pain, frequency and hematuria.  Musculoskeletal: Negative for arthralgias and back pain.  Skin: Negative for rash.  Neurological: Negative for dizziness, speech difficulty, weakness, numbness and headaches.    Physical Exam Updated Vital Signs BP 123/76 (BP Location: Right Arm)   Pulse 96   Temp 98.9 F (37.2 C) (Oral)   Resp (!) 25   SpO2 97%   Physical Exam Constitutional:      Appearance: She is well-developed.  HENT:     Head: Normocephalic and atraumatic.  Eyes:     Pupils: Pupils are equal, round, and reactive to  light.  Cardiovascular:     Rate and Rhythm: Normal rate and regular rhythm.     Heart sounds: Normal heart sounds.  Pulmonary:     Effort: Pulmonary effort is normal. No respiratory distress.     Breath sounds: Wheezing and rales present.  Chest:     Chest wall: No tenderness.  Abdominal:     General: Bowel sounds are normal.     Palpations: Abdomen is soft.  Tenderness: There is no abdominal tenderness. There is no guarding or rebound.  Musculoskeletal:        General: Normal range of motion.     Cervical back: Normal range of motion and neck supple.     Comments: No edema or calf tenderness  Lymphadenopathy:     Cervical: No cervical adenopathy.  Skin:    General: Skin is warm and dry.     Findings: No rash.  Neurological:     Mental Status: She is alert and oriented to person, place, and time.     ED Results / Procedures / Treatments   Labs (all labs ordered are listed, but only abnormal results are displayed) Labs Reviewed  RESP PANEL BY RT-PCR (FLU A&B, COVID) ARPGX2  BASIC METABOLIC PANEL  CBC WITH DIFFERENTIAL/PLATELET  BRAIN NATRIURETIC PEPTIDE    EKG None  Radiology No results found.  Procedures Procedures   Medications Ordered in ED Medications  predniSONE (DELTASONE) tablet 60 mg (has no administration in time range)  ipratropium-albuterol (DUONEB) 0.5-2.5 (3) MG/3ML nebulizer solution 3 mL (3 mLs Nebulization Given 11/07/20 1556)    ED Course  I have reviewed the triage vital signs and the nursing notes.  Pertinent labs & imaging results that were available during my care of the patient were reviewed by me and considered in my medical decision making (see chart for details).    MDM Rules/Calculators/A&P                          Patient presents with shortness of breath.  She does have some wheezing and her presentation could be consistent with asthma although she also has some crackles and given her increased fluid on presentation yesterday,  I felt that this also could be consistent with a CHF exacerbation.  I explained this to the patient and ordered labs and a chest x-ray as well as an EKG.  Patient is adamantly refusing all of this.  I had a long discussion with her about how wheezing can be present with both asthma and increased fluid.  She is adamant that she only wants to get a breathing treatment and then be discharged.  She was given a breathing treatment.  She did feel some better but on my exam she still appears to be short of breath.  I asked her again about doing further treatment or evaluation and she is refusing.  She states that she will come back if she does not get any better.  I did give her a dose of prednisone prior to discharge although I do not feel like she needs further evaluation for other potential causes of her shortness of breath. Final Clinical Impression(s) / ED Diagnoses Final diagnoses:  Shortness of breath    Rx / DC Orders ED Discharge Orders    None       Rolan Bucco, MD 11/07/20 [phone]

## 2020-11-07 NOTE — ED Triage Notes (Signed)
Pt came in from home c/o [facility]. Pt states this started yesterday afternoon. Pt states she has a hx of heart failure but she believes her symptoms are due to her sleeping with the [facility] and Fan on. She is requesting a breathing treatment and denies any COVID exposure. Pt presents with a cough. No NVD or fevers.

## 2020-11-07 NOTE — ED Notes (Signed)
Pt declines any intervention except for breathing treatment. She refuses IV, blood work,chest xray. Pt educated by MD and still refuses. Pt states she does not have anyone to get her daughter.

## 2020-11-07 NOTE — Discharge Instructions (Addendum)
Follow-up with your primary care doctor.  Return here if you have any ongoing or worsening symptoms or if you change your mind about further evaluation.

## 2020-11-08 NOTE — Progress Notes (Signed)
Date:  11/08/2020   ID:  Sheila Estes, DOB 08-01-1990, MRN [id number]   Provider location: [place] [facility] Type of Visit: Established patient   PCP:  Grayce Sessions, NP  Cardiologist:  Dr. Shirlee Latch  Chief Complaint: F/u for Chronic Systolic Heart Failure    History of Present Illness: Sheila Estes is a 30 y.o. with a history of history of obesity, anxiety, asthma, bipolar disorder, depression, and CAD, chronic systolic heart failure.  Admitted 2/23-09/04/18 with pulmonary edema. She had been short of breath for about a month, which started after drinking 2 Red Bulls. She had been seen at [facility] and [facility] and was treated for asthma and for PNA. Echo at 2/20 admission showed newly reduced EF 25-30% with peri-apical akinesis and apical thrombus. Transferred to [facility] and had LHC/RHC, which showed normal filling pressures and preserved cardiac output. The proximal LAD was subtotally occluded, it looked like a SCAD event. Cardiac MRI suggested that LAD territory was infarcted and unlikely to be viable with restoration of perfusion.HF medications were optimized and she was set up with LifeVest for discharge. She was started on coumadin for LV thrombus. DC weight: 198 lbs.  Seen in the [facility] 02/2019. At that time she had self discontinued her Lifevest and had returned it. Echo was repeated at visit and EF remained severely reduced at 20-25% and RV was moderately reduced.   On coumdin for LV thrombus. INRs followed by [facility] coumadin clinic.   She returns for followup of CHF.  Weight is up about 8 lbs.  She is rarely taking Lasix.  She is teaching full time.  She is trying to exercise, has a Psychologist, educational.  She can walk up a 2 flights of stairs before getting short of breath.  No dyspnea on flat ground.  No chest pain.  No lightheadedness.  Lots of stress in her life.  No orthopnea/PND.    We discussed importance of avoiding pregnancy on her current meds. She is not on OCPs.    REDS clip 37%  Labs (3/20): K 3.9, creatinine 0.79 Labs (8/21): K 3.6 creatinine 0.71 Labs (10/21): K 4, creatinine 0.62, LDL 57  PMH: 1. Depression 2. Anxiety 3. Asthma 4. Chronic systolic CHF: Ischemic cardiomyopathy.   - Echo (2/20) with EF 25-30%, peri-apical akinesis, LV thrombus.  - LHC/RHC (2/20): Subtotal LAD occlusion after S1.  Appearance of LAD suggests SCAD.  Mean RA 1, PA 36/8 mean 23, mean PCWP 12, CI 2.46.  - Cardiac MRI (2/20): Mildly dilated LV with EF 27%, delayed enhancement images suggest a dense LAD-territory infarct (myocardium unlikely to be viable), LV apical thrombus, RV EF 31%.  5. CAD: LHC (2/20) with subtotal LAD occlusion after S1.  Appearance of LAD suggests SCAD.  Current Outpatient Medications  Medication Sig Dispense Refill  . albuterol (VENTOLIN HFA) 108 (90 Estes) MCG/ACT inhaler Inhale 2 puffs into the lungs every 6 (six) hours as needed for wheezing or shortness of breath. 1 each 2  . fluticasone (FLOVENT HFA) 110 MCG/ACT inhaler Inhale 2 puffs into the lungs 2 (two) times daily at 10 AM and 5 PM. 1 each 12  . warfarin (COUMADIN) 5 MG tablet Take as directed by Anticoagulation Clinic. 60 tablet 1  . atorvastatin (LIPITOR) 80 MG tablet Take 1 tablet (80 mg total) by mouth daily at 6 PM. 90 tablet 3  . carvedilol (COREG) 12.5 MG tablet Take 1 tablet (12.5 mg total) by mouth 2 (two)  times daily with a meal. 180 tablet 3  . dapagliflozin propanediol (FARXIGA) 10 MG TABS tablet Take 1 tablet (10 mg total) by mouth daily before breakfast. 90 tablet 3  . ENTRESTO 49-51 MG TAKE 1 TABLET BY MOUTH TWICE DAILY 60 tablet 11  . furosemide (LASIX) 20 MG tablet Take 1 tablet (20 mg total) by mouth daily. 90 tablet 3  . potassium chloride SA (KLOR-CON) 20 MEQ tablet Take 1 tablet (20 mEq total) by mouth daily. With every LASIX dose 90 tablet 3  . spironolactone (ALDACTONE) 25 MG tablet Take 1 tablet (25 mg total) by mouth at bedtime. 90 tablet 3   No current  facility-administered medications for this encounter.    Allergies:   Other and Gluten meal   Social History:  The patient  reports that she has never smoked. She has never used smokeless tobacco. She reports current alcohol use. She reports that she does not use drugs.   Family History:  The patient's family history includes Diabetes in her maternal grandmother; Hypertension in her father, maternal grandfather, and mother.   ROS:  Please see the history of present illness.   All other systems are personally reviewed and negative.  Vitals:   11/06/20 1514  BP: 110/70  Pulse: 85  SpO2: 97%   General: NAD Neck: JVP 8-9 cm, no thyromegaly or thyroid nodule.  Lungs: Clear to auscultation bilaterally with normal respiratory effort. CV: Nondisplaced PMI.  Heart regular S1/S2, no S3/S4, no murmur.  Trace ankle edema.  No carotid bruit.  Normal pedal pulses.  Abdomen: Soft, nontender, no hepatosplenomegaly, no distention.  Skin: Intact without lesions or rashes.  Neurologic: Alert and oriented x 3.  Psych: Normal affect. Extremities: No clubbing or cyanosis.  HEENT: Normal.   Recent Labs: 02/11/2020: Hemoglobin 12.8; Platelets 187 11/06/2020: B Natriuretic Peptide 432.8; BUN 14; Creatinine, Ser 0.76; Potassium 3.7; Sodium 138     Wt Readings from Last 3 Encounters:  11/06/20 100.8 kg (222 lb 3.2 oz)  05/20/20 91.9 kg (202 lb 9.6 oz)  05/19/20 92.7 kg (204 lb 6.4 oz)    ASSESSMENT AND PLAN:  1. 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  RHC in 2/20 showed normal filling pressures and preserved cardiac output.  Cardiac MRI in 2/20 suggested that LAD territory was infarcted and unlikely to be viable with restoration of perfusion. Repeat Echo 02/2019, EF 20-25% RV was moderately reduced.  NYHA class II symptoms.  Weight up 8 lbs. Mild volume overload by exam and by REDs clip.  She  has been reluctant to titrate her meds because she does not want them to "stop working."  - Continue Entresto 49/51 bid. BMET today.  - Continue Farxiga 10 mg daily  - Start Lasix 20 mg daily, BMET 10 days.  - Continue Spironolactone 25 mg daily  - Increase Coreg to 12.5 mg bid.  - consider future addition of Bidil if BP can tolerate (AA)  - We discussed again today the need to avoid pregnancy given teratogenic potentional of cardiac meds. She understands she need to contact HF clinic if she is gets pregnant so we adjust HF medications.  - As above, LAD territory is unlikely to be viable based on MRI, so would not pursue CTO procedure or CABG.  - She has been reluctant to get ICD, have had discussion about it in the past and talked again about  it today.  Will repeat echo at followup in 3 months, she is willing to consider ICD if EF remains low.  2. H/o LV thrombus: On warfarin.  No overt bleeding.  3. 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 No chest pain.  - On warfarin so no ASA.  - On high dose statin, good lipids in 10/21.   - LAD territory is unlikely to be viable based on MRI, so would not pursue CTO procedure or CABG.  Followup with HF pharmacist in 3 wks to increase Entresto.  Followup 3 months with me with echo.   Signed, Marca Ancona, MD  11/08/2020  [facility] [address] [facility] [place] [address] [phone] (office) [phone] (fax)

## 2020-11-18 ENCOUNTER — Other Ambulatory Visit: Payer: Self-pay

## 2020-11-18 ENCOUNTER — Other Ambulatory Visit (HOSPITAL_COMMUNITY): Payer: Medicaid Other

## 2020-11-18 ENCOUNTER — Ambulatory Visit (INDEPENDENT_AMBULATORY_CARE_PROVIDER_SITE_OTHER): Payer: Medicaid Other | Admitting: *Deleted

## 2020-11-18 DIAGNOSIS — Z5181 Encounter for therapeutic drug level monitoring: Secondary | ICD-10-CM | POA: Diagnosis not present

## 2020-11-18 LAB — POCT INR: INR: 2.6 (ref 2.0–3.0)

## 2020-11-18 NOTE — Patient Instructions (Signed)
Description   START taking warfarin 2 tablets daily except for 1 tablet on Sundays and Thursdays. Recheck INR in 10days. Coumadin Clinic [phone].

## 2020-11-26 ENCOUNTER — Other Ambulatory Visit: Payer: Self-pay

## 2020-11-26 ENCOUNTER — Ambulatory Visit (HOSPITAL_COMMUNITY)
Admission: RE | Admit: 2020-11-26 | Discharge: 2020-11-26 | Disposition: A | Discharge status: Home / Self Care | Payer: Medicaid Other | Source: Ambulatory Visit | Attending: Internal Medicine | Admitting: Internal Medicine

## 2020-11-26 DIAGNOSIS — I5022 Chronic systolic (congestive) heart failure: Secondary | ICD-10-CM | POA: Insufficient documentation

## 2020-11-26 DIAGNOSIS — Z5181 Encounter for therapeutic drug level monitoring: Secondary | ICD-10-CM

## 2020-11-26 LAB — BASIC METABOLIC PANEL
Anion gap: 6 (ref 5–15)
BUN: 13 mg/dL (ref 6–20)
CO2: 25 mmol/L (ref 22–32)
Calcium: 8.8 mg/dL — ABNORMAL LOW (ref 8.9–10.3)
Chloride: 105 mmol/L (ref 98–111)
Creatinine, Ser: 0.67 mg/dL (ref 0.44–1.00)
GFR, Estimated: >60 mL/min (ref >60)
Glucose, Bld: 91 mg/dL (ref 70–99)
Potassium: 3.9 mmol/L (ref 3.5–5.1)
Sodium: 136 mmol/L (ref 135–145)

## 2020-11-28 ENCOUNTER — Telehealth: Payer: Self-pay | Admitting: *Deleted

## 2020-11-28 NOTE — Telephone Encounter (Signed)
Left a message for the pt to call back to reschedule today's missed appt.

## 2020-12-08 ENCOUNTER — Ambulatory Visit (INDEPENDENT_AMBULATORY_CARE_PROVIDER_SITE_OTHER): Payer: Medicaid Other | Admitting: *Deleted

## 2020-12-08 ENCOUNTER — Other Ambulatory Visit: Payer: Self-pay

## 2020-12-08 DIAGNOSIS — Z5181 Encounter for therapeutic drug level monitoring: Secondary | ICD-10-CM

## 2020-12-08 LAB — POCT INR: INR: 2 (ref 2.0–3.0)

## 2020-12-08 NOTE — Patient Instructions (Addendum)
Description   Take 2.5 tablets today and then Continue taking warfarin 2 tablets daily except for 1 tablet on Sundays and Thursdays. Recheck INR in 3 weeks.  Coumadin Clinic [phone].

## 2020-12-18 ENCOUNTER — Encounter (HOSPITAL_COMMUNITY): Payer: Self-pay

## 2020-12-18 ENCOUNTER — Other Ambulatory Visit: Payer: Self-pay

## 2020-12-18 ENCOUNTER — Ambulatory Visit (HOSPITAL_COMMUNITY)
Admission: RE | Admit: 2020-12-18 | Discharge: 2020-12-18 | Disposition: A | Discharge status: Home / Self Care | Payer: Medicaid Other | Source: Ambulatory Visit | Attending: Internal Medicine | Admitting: Internal Medicine

## 2020-12-18 VITALS — BP 126/88 | HR 80 | Wt 215.0 lb

## 2020-12-18 DIAGNOSIS — Z79899 Other long term (current) drug therapy: Secondary | ICD-10-CM | POA: Insufficient documentation

## 2020-12-18 DIAGNOSIS — I5022 Chronic systolic (congestive) heart failure: Secondary | ICD-10-CM | POA: Insufficient documentation

## 2020-12-18 DIAGNOSIS — I255 Ischemic cardiomyopathy: Secondary | ICD-10-CM | POA: Diagnosis not present

## 2020-12-18 DIAGNOSIS — I2510 Atherosclerotic heart disease of native coronary artery without angina pectoris: Secondary | ICD-10-CM | POA: Insufficient documentation

## 2020-12-18 DIAGNOSIS — E71312 Short chain acyl CoA dehydrogenase deficiency: Secondary | ICD-10-CM | POA: Insufficient documentation

## 2020-12-18 DIAGNOSIS — Z7901 Long term (current) use of anticoagulants: Secondary | ICD-10-CM | POA: Insufficient documentation

## 2020-12-18 NOTE — Patient Instructions (Signed)
It was a pleasure seeing you today!  MEDICATIONS: -No medication changes today -Please take all medication prior to your next appointment! -Call if you have questions about your medications.  NEXT APPOINTMENT: Return to clinic in 1 month with APP clinic.  In general, to take care of your heart failure: -Limit your fluid intake to 2 Liters (half-gallon) per day.   -Limit your salt intake to ideally 2-3 grams (2000-3000 mg) per day. -Weigh yourself daily and record, and bring that "weight diary" to your next appointment.  (Weight gain of 2-3 pounds in 1 day typically means fluid weight.) -The medications for your heart are to help your heart and help you live longer.   -Please contact us before stopping any of your heart medications.  Call the clinic at [phone] with questions or to reschedule future appointments.

## 2020-12-18 NOTE — Progress Notes (Signed)
PCP:  Grayce Sessions, NP          Cardiologist:  Dr. Shirlee Latch  HPI:  Sheila Estes is a 30 year old with a history of history of obesity, anxiety, asthma, bipolar disorder,  Depression, and CAD, chronic systolic heart failure.  Admitted 2/23-09/04/18 with pulmonary edema. She had been short of breath for about a month, which started after drinking 2 Red Bulls. She had been seen at [facility] and [facility] and was treated for asthma and for PNA. Echo at 2/20 admission showed newly reduced EF 25-30% with peri-apical akinesis and apical thrombus. Transferred to [facility] and had LHC/RHC, which showed normal filling pressures and preserved cardiac output.  The proximal LAD was subtotally occluded, it looked like a SCAD event. Cardiac MRI suggested that LAD territory was infarcted and unlikely to be viable with restoration of perfusion. HF medications were optimized and she was set up with LifeVest for discharge. She was started on coumadin for LV thrombus. DC weight: 198 lbs.   Seen in the [facility] [place] 02/2019. At that time she had self discontinued her Lifevest and had returned it. Echo was repeated at visit and EF remained severely reduced at 20-25% and RV was moderately reduced  Last seen in clinic by Dr. Shirlee Latch on 11/06/2020. Endorses rarely taking Lasix PRN. No SOB, CP, lightheadedness, orthopnea, PND. REDS clip 37%.   Had an ED visit on 5/6 for SOB, wheezing, and a cough. Thought to be CHF exacerbation but pt denied further workup and treatment. Pt insisted on being treated for asthma exacerbation and returned home.   Today she returns to HF clinic for pharmacist medication titration. At last visit with MD, pt was started on Lasix 20 mg daily and increased Coreg to 12.5mg  BID. Recent ECHO from 02/2021 revealed EF 20-25%.  Today she is overall feeling good. No dizziness, lightheadedness, fatigue, CP, or palpitations. No LEE/swelling. Breathing is good, no SOB, PDN, orthopnea. Able to complete ADLs without issue. Exercises  on treadmill >1hr every day. Weight at home 212 pounds. Takes furosemide 20 mg daily. Appetite good, transitioned to plant based diet now. Notes occasional missed doses, has a pillbox. REDS 34%.  HF Medications: Carvedilol 12.5 mg BID Entresto 49/51 mg BID Spironolactone 25 mg daily Farxiga 10 mg daily Furosemide 20 mg daily  Has the patient been experiencing any side effects to the medications prescribed?  no  Does the patient have any problems obtaining medications due to transportation or finances?   No - has Medicaid  Understanding of regimen: excellent Understanding of indications: excellent Potential of compliance: fair Patient understands to avoid NSAIDs. Patient understands to avoid decongestants.    Pertinent Lab Values: Serum creatinine 0.67, BUN 13, Potassium 3.9, Sodium 136  Vital Signs: Weight: 215 lbs (last clinic weight: 222 lbs) Blood pressure: 126/88 mmHg (forgot to take meds)  Heart rate: 80 bpm ReDS: 34%  Assessment/Plan: 1. 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  RHC in 2/20 showed normal filling pressures and preserved cardiac output.  Cardiac MRI in 2/20 suggested that LAD territory was infarcted and unlikely to be viable with restoration of perfusion. Repeat Echo 02/2019, EF 20-25% RV was moderately reduced.  NYHA class II symptoms. ReDS improved to 34% today. Did not take her medications since yesterday morning. BP in clinic 126/88. - Continue carvedilol 12.5 mg BID - Continue Entresto 49/51 mg BID - Continue Farxiga  10 mg daily - Continue spironolactone 25 mg daily - Continue furosemide 20 mg daily - Consider future addition of BiDil if BP can tolerate (AA) - No medication titration today since she hasn't taken her meds since yesterday AM - As above, LAD territory is unlikely to be viable based on MRI, so would not pursue CTO procedure or  CABG.  - She has been reluctant to get ICD, have had discussion about it in the past and talked again about it today.  Will repeat echo at followup in 3 months, she is willing to consider ICD if EF remains low. 2. H/o LV thrombus: On warfarin.  No overt bleeding.  3. 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 No chest pain. - On warfarin so no ASA.  - On high dose statin, good lipids in 10/21.   - LAD territory is unlikely to be viable based on MRI, so would not pursue CTO procedure or CABG.   Followup with APP clinic in 1 month.  Followup 3 months for ECHO + visit with Dr. Siri Cole [place] [facility]) Sheila Estes, PharmD Student Sheila Estes, PharmD, PGY1 Community Pharmacy Resident  Sharen Hones, PharmD, BCPS [facility] Pharmacist [phone]

## 2020-12-31 ENCOUNTER — Ambulatory Visit (INDEPENDENT_AMBULATORY_CARE_PROVIDER_SITE_OTHER): Payer: Medicaid Other | Admitting: *Deleted

## 2020-12-31 ENCOUNTER — Other Ambulatory Visit: Payer: Self-pay

## 2020-12-31 DIAGNOSIS — Z5181 Encounter for therapeutic drug level monitoring: Secondary | ICD-10-CM

## 2020-12-31 DIAGNOSIS — I236 Thrombosis of atrium, auricular appendage, and ventricle as current complications following acute myocardial infarction: Secondary | ICD-10-CM

## 2020-12-31 LAB — POCT INR: INR: 1.6 — AB (ref 2.0–3.0)

## 2020-12-31 NOTE — Patient Instructions (Signed)
Description   Take 2.5 tablets today and then continue taking warfarin 2 tablets daily except for 1 tablet on Sundays and Thursdays. Recheck INR in 2 weeks.  Coumadin Clinic [phone].

## 2021-01-13 DIAGNOSIS — F411 Generalized anxiety disorder: Secondary | ICD-10-CM | POA: Diagnosis not present

## 2021-01-13 DIAGNOSIS — F439 Reaction to severe stress, unspecified: Secondary | ICD-10-CM | POA: Diagnosis not present

## 2021-01-14 ENCOUNTER — Other Ambulatory Visit (HOSPITAL_COMMUNITY): Payer: Self-pay | Admitting: *Deleted

## 2021-01-14 ENCOUNTER — Other Ambulatory Visit: Payer: Self-pay | Admitting: Cardiology

## 2021-01-14 MED ORDER — WARFARIN SODIUM 5 MG PO TABS: ORAL_TABLET | ORAL | 1 refills | Status: DC

## 2021-01-14 NOTE — Progress Notes (Signed)
Date:  01/16/2021   ID:  Sheila Estes, DOB [birth year], MRN [id number]   Provider location: [place] [facility] Type of Visit: Established patient   PCP:  Grayce Sessions, NP  Cardiologist:  Dr. Shirlee Latch  Chief Complaint: F/u for Chronic Systolic Heart Failure    History of Present Illness: Sheila Estes is a 30 y.o. with a history of history of obesity, anxiety, asthma, bipolar disorder, depression, and CAD, chronic systolic heart failure.  Admitted 2/23-09/04/18 with pulmonary edema. She had been short of breath for about a month, which started after drinking 2 Red Bulls. She had been seen at [facility] and [facility] and was treated for asthma and for PNA. Echo at 2/20 admission showed newly reduced EF 25-30% with peri-apical akinesis and apical thrombus. Transferred to [facility] and had LHC/RHC, which showed normal filling pressures and preserved cardiac output.  The proximal LAD was subtotally occluded, it looked like a SCAD event. Cardiac MRI suggested that LAD territory was infarcted and unlikely to be viable with restoration of perfusion. HF medications were optimized and she was set up with LifeVest for discharge. She was started on coumadin for LV thrombus. DC weight: 198 lbs.  Seen in the [facility] 02/2019. At that time she had self discontinued her Lifevest and had returned it. Echo was repeated at visit and EF remained severely reduced at 20-25% and RV was moderately reduced.   On coumdin for LV thrombus. INRs followed by [facility] coumadin clinic.   She returned 5/22 for followup of CHF.  Weight is up about 8 lbs.  She is rarely taking Lasix.  She is teaching full time.  She is trying to exercise, has a Psychologist, educational.  She can walk up a 2 flights of stairs before getting short of breath.  No dyspnea on flat ground.  No chest pain.  No lightheadedness.  Lots of stress in her life.  No orthopnea/PND.    We discussed importance of avoiding pregnancy on her current meds. She is not on  OCPs.   Today she returns for HF follow up. Overall feeling fine. Denies increasing SOB, CP, dizziness, edema, or PND/Orthopnea. Appetite ok. No fever or chills. She does not check her weight at home. Missing some night doses of meds 3-4x/week. Has been out of Coumadin for 1 week. She has been drinking >2L/day recently.  REDS clip 42%  Labs (3/20): K 3.9, creatinine 0.79 Labs (8/21): K 3.6 creatinine 0.71 Labs (10/21): K 4, creatinine 0.62, LDL 57 Labs (5/22): K 3.9, creatinine 0.67  PMH: 1. Depression 2. Anxiety 3. Asthma 4. Chronic systolic CHF: Ischemic cardiomyopathy.   - Echo (2/20) with EF 25-30%, peri-apical akinesis, LV thrombus.  - LHC/RHC (2/20): Subtotal LAD occlusion after S1.  Appearance of LAD suggests SCAD.  Mean RA 1, PA 36/8 mean 23, mean PCWP 12, CI 2.46.  - Cardiac MRI (2/20): Mildly dilated LV with EF 27%, delayed enhancement images suggest a dense LAD-territory infarct (myocardium unlikely to be viable), LV apical thrombus, RV EF 31%.  5. CAD: LHC (2/20) with subtotal LAD occlusion after S1.  Appearance of LAD suggests SCAD.  Current Outpatient Medications  Medication Sig Dispense Refill   albuterol (VENTOLIN HFA) 108 (90 [facility]es) MCG/ACT inhaler Inhale 2 puffs into the lungs every 6 (six) hours as needed for wheezing or shortness of breath. 1 each 2   atorvastatin (LIPITOR) 80 MG tablet Take 1 tablet (80 mg total) by mouth daily at 6 PM.  90 tablet 3   carvedilol (COREG) 12.5 MG tablet Take 1 tablet (12.5 mg total) by mouth 2 (two) times daily with a meal. 180 tablet 3   dapagliflozin propanediol (FARXIGA) 10 MG TABS tablet Take 1 tablet (10 mg total) by mouth daily before breakfast. 90 tablet 3   fluticasone (FLOVENT HFA) 110 MCG/ACT inhaler Inhale 2 puffs into the lungs 2 (two) times daily at 10 AM and 5 PM. 1 each 12   furosemide (LASIX) 20 MG tablet Take 1 tablet (20 mg total) by mouth daily. 90 tablet 3   potassium chloride SA (KLOR-CON) 20 MEQ tablet Take 1 tablet  (20 mEq total) by mouth daily. With every LASIX dose 90 tablet 3   sacubitril-valsartan (ENTRESTO) 97-103 MG Take 1 tablet by mouth 2 (two) times daily. 60 tablet 11   spironolactone (ALDACTONE) 25 MG tablet Take 1 tablet (25 mg total) by mouth at bedtime. 90 tablet 3   warfarin (COUMADIN) 5 MG tablet TAKE AS DIRECTED BY ANTICOAGUALTION CLINIC 60 tablet 1   No current facility-administered medications for this encounter.    Allergies:   Other and Gluten meal   Social History:  The patient  reports that she has never smoked. She has never used smokeless tobacco. She reports current alcohol use. She reports that she does not use drugs.   Family History:  The patient's family history includes Diabetes in her maternal grandmother; Hypertension in her father, maternal grandfather, and mother.   ROS:  Please see the history of present illness.   All other systems are personally reviewed and negative.   BP 122/78   Pulse 72   Wt 98.4 kg (217 lb)   SpO2 98%   BMI 41.00 kg/m   General:  NAD. No resp difficulty HEENT: Normal Neck: Supple. JVP 6-7. Carotids 2+ bilat; no bruits. No lymphadenopathy or thryomegaly appreciated. Cor: PMI nondisplaced. Regular rate & rhythm. No rubs, gallops or murmurs. Lungs: Clear Abdomen: Obese, nontender, nondistended. No hepatosplenomegaly. No bruits or masses. Good bowel sounds. Extremities: No cyanosis, clubbing, rash, edema Neuro: Alert & oriented x 3, cranial nerves grossly intact. Moves all 4 extremities w/o difficulty. Affect pleasant.  Recent Labs: 02/11/2020: Hemoglobin 12.8; Platelets 187 11/06/2020: B Natriuretic Peptide 432.8 11/26/2020: BUN 13; Creatinine, Ser 0.67; Potassium 3.9; Sodium 136   Wt Readings from Last 3 Encounters:  01/16/21 98.4 kg (217 lb)  12/18/20 97.5 kg (215 lb)  11/06/20 100.8 kg (222 lb 3.2 oz)    ASSESSMENT AND PLAN:  1. 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  RHC in 2/20 showed normal filling pressures and preserved cardiac output.  Cardiac MRI in 2/20 suggested that LAD territory was infarcted and unlikely to be viable with restoration of perfusion. Repeat Echo 02/2019, EF 20-25% RV was moderately reduced.  NYHA class II symptoms.  Mild volume overload by exam and by REDs clip.   - Increase Entresto to 97/103 bid. BMET today, repeat 10 days. - Increase lasix to 20 mg bid x 3 days, then back to 20 mg daily. - Continue Spironolactone 25 mg daily  - Continue Coreg 12.5 mg bid.  - consider future addition of Bidil if BP can tolerate (AA).  - We discussed again today the need to avoid pregnancy given teratogenic potentional of cardiac meds. She understands she need to contact HF clinic if she is gets pregnant so we can adjust  HF medications.  - As above, LAD territory is unlikely to be viable based on MRI, so would not pursue CTO procedure or CABG.  - She has been reluctant to get ICD, have had discussion about it in the past and talked again about it today.  Will repeat echo at followup in 3 months, she is willing to consider ICD if EF remains low.  2. H/o LV thrombus: On warfarin.  No overt bleeding.  3. 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 No chest pain.  - On warfarin so no ASA.  - On high dose statin, good lipids in 10/21.   - LAD territory is unlikely to be viable based on MRI, so would not pursue CTO procedure or CABG. 4. Obesity: Body mass index is 41 kg/m. - Needs weight loss  - Follow up with Dr. Shirlee Latch next month w/ echo as scheduled.   - I asked her to start weighing daily and ensuring she is taking her evening doses of her medications.  Signed, Jacklynn Ganong, FNP  01/16/2021  [facility]  [address] [facility] [place] [address] [phone] (office) [phone] (fax)

## 2021-01-16 ENCOUNTER — Other Ambulatory Visit: Payer: Self-pay

## 2021-01-16 ENCOUNTER — Encounter (HOSPITAL_COMMUNITY): Payer: Self-pay

## 2021-01-16 ENCOUNTER — Ambulatory Visit (HOSPITAL_COMMUNITY)
Admission: RE | Admit: 2021-01-16 | Discharge: 2021-01-16 | Disposition: A | Discharge status: Home / Self Care | Payer: Medicaid Other | Source: Ambulatory Visit | Attending: Family Medicine | Admitting: Family Medicine

## 2021-01-16 VITALS — BP 122/78 | HR 72 | Wt 217.0 lb

## 2021-01-16 DIAGNOSIS — Z8249 Family history of ischemic heart disease and other diseases of the circulatory system: Secondary | ICD-10-CM | POA: Diagnosis not present

## 2021-01-16 DIAGNOSIS — J45909 Unspecified asthma, uncomplicated: Secondary | ICD-10-CM | POA: Diagnosis not present

## 2021-01-16 DIAGNOSIS — I255 Ischemic cardiomyopathy: Secondary | ICD-10-CM | POA: Diagnosis not present

## 2021-01-16 DIAGNOSIS — Z7901 Long term (current) use of anticoagulants: Secondary | ICD-10-CM | POA: Insufficient documentation

## 2021-01-16 DIAGNOSIS — E669 Obesity, unspecified: Secondary | ICD-10-CM | POA: Insufficient documentation

## 2021-01-16 DIAGNOSIS — Z7984 Long term (current) use of oral hypoglycemic drugs: Secondary | ICD-10-CM | POA: Diagnosis not present

## 2021-01-16 DIAGNOSIS — Z79899 Other long term (current) drug therapy: Secondary | ICD-10-CM | POA: Diagnosis not present

## 2021-01-16 DIAGNOSIS — Z7951 Long term (current) use of inhaled steroids: Secondary | ICD-10-CM | POA: Insufficient documentation

## 2021-01-16 DIAGNOSIS — I5022 Chronic systolic (congestive) heart failure: Secondary | ICD-10-CM | POA: Diagnosis not present

## 2021-01-16 DIAGNOSIS — I236 Thrombosis of atrium, auricular appendage, and ventricle as current complications following acute myocardial infarction: Secondary | ICD-10-CM

## 2021-01-16 DIAGNOSIS — I2510 Atherosclerotic heart disease of native coronary artery without angina pectoris: Secondary | ICD-10-CM

## 2021-01-16 DIAGNOSIS — Z6841 Body Mass Index (BMI) 40.0 and over, adult: Secondary | ICD-10-CM | POA: Insufficient documentation

## 2021-01-16 DIAGNOSIS — Z86718 Personal history of other venous thrombosis and embolism: Secondary | ICD-10-CM | POA: Diagnosis not present

## 2021-01-16 MED ORDER — ENTRESTO 97-103 MG PO TABS: 1.0000 | ORAL_TABLET | Freq: Two times a day (BID) | ORAL | 11 refills | Status: DC

## 2021-01-16 NOTE — Progress Notes (Signed)
ReDS Vest / Clip - 01/16/21 1100       ReDS Vest / Clip   Station Marker B    Ruler Value 32    ReDS Value Range High volume overload    ReDS Actual Value 42

## 2021-01-16 NOTE — Patient Instructions (Addendum)
INCREASE Entresto to 97/103 mg, one tab twice a day INCREASE Lasix to 20 mg twice a day for 3 days, then resume normal dose of 20 mg daily  Labs needed in 3 days and in 14 days  Keep echo and follow up with Dr Shirlee Latch as scheduled  Do the following things EVERYDAY: Weigh yourself in the morning before breakfast. Write it down and keep it in a log. Take your medicines as prescribed Eat low salt foods--Limit salt (sodium) to 2000 mg per day.  Stay as active as you can everyday Limit all fluids for the day to less than 2 liters  milAt the [facility], you and your health needs are our priority. As part of our continuing mission to provide you with exceptional heart care, we have created designated Provider Care Teams. These Care Teams include your primary Cardiologist (physician) and Advanced Practice Providers (APPs- Physician Assistants and Nurse Practitioners) who all work together to provide you with the care you need, when you need it.   You may see any of the following providers on your designated Care Team at your next follow up: Dr Arvilla Meres Dr Marca Ancona Dr Brandon Melnick, NP Robbie Lis, [place] Mikki Santee Karle Plumber, PharmD   Please be sure to bring in all your medications bottles to every appointment.   If you have any questions or concerns before your next appointment please send [place] a message through [place] or call our office at [phone].    TO LEAVE A MESSAGE FOR THE NURSE SELECT OPTION 2, PLEASE LEAVE A MESSAGE INCLUDING: YOUR NAME DATE OF BIRTH CALL BACK NUMBER REASON FOR CALL**this is important as we prioritize the call backs  YOU WILL RECEIVE A CALL BACK THE SAME DAY AS LONG AS YOU CALL BEFORE 4:00 PM

## 2021-01-19 ENCOUNTER — Other Ambulatory Visit (HOSPITAL_COMMUNITY): Payer: Medicaid Other

## 2021-01-19 DIAGNOSIS — F411 Generalized anxiety disorder: Secondary | ICD-10-CM | POA: Diagnosis not present

## 2021-01-19 DIAGNOSIS — F439 Reaction to severe stress, unspecified: Secondary | ICD-10-CM | POA: Diagnosis not present

## 2021-01-28 ENCOUNTER — Ambulatory Visit (INDEPENDENT_AMBULATORY_CARE_PROVIDER_SITE_OTHER): Payer: Medicaid Other | Admitting: *Deleted

## 2021-01-28 ENCOUNTER — Other Ambulatory Visit: Payer: Self-pay

## 2021-01-28 DIAGNOSIS — Z5181 Encounter for therapeutic drug level monitoring: Secondary | ICD-10-CM | POA: Diagnosis not present

## 2021-01-28 LAB — POCT INR: INR: 1.7 — AB (ref 2.0–3.0)

## 2021-01-28 NOTE — Patient Instructions (Signed)
Description   Take 2.5 tablets today and then continue taking warfarin 2 tablets daily except for 1 tablet on Sundays and Thursdays. Recheck INR in 2 weeks.  Coumadin Clinic [phone].      

## 2021-01-30 ENCOUNTER — Other Ambulatory Visit (HOSPITAL_COMMUNITY): Payer: Medicaid Other

## 2021-02-02 ENCOUNTER — Ambulatory Visit (HOSPITAL_COMMUNITY)
Admission: RE | Admit: 2021-02-02 | Discharge: 2021-02-02 | Disposition: A | Discharge status: Home / Self Care | Payer: Medicaid Other | Source: Ambulatory Visit | Attending: Cardiology | Admitting: Cardiology

## 2021-02-02 ENCOUNTER — Other Ambulatory Visit: Payer: Self-pay

## 2021-02-02 DIAGNOSIS — I5022 Chronic systolic (congestive) heart failure: Secondary | ICD-10-CM | POA: Diagnosis not present

## 2021-02-02 LAB — BASIC METABOLIC PANEL
Anion gap: 9 (ref 5–15)
BUN: 18 mg/dL (ref 6–20)
CO2: 22 mmol/L (ref 22–32)
Calcium: 9.3 mg/dL (ref 8.9–10.3)
Chloride: 105 mmol/L (ref 98–111)
Creatinine, Ser: 0.7 mg/dL (ref 0.44–1.00)
GFR, Estimated: >60 mL/min (ref >60)
Glucose, Bld: 110 mg/dL — ABNORMAL HIGH (ref 70–99)
Potassium: 3.8 mmol/L (ref 3.5–5.1)
Sodium: 136 mmol/L (ref 135–145)

## 2021-02-09 ENCOUNTER — Ambulatory Visit (INDEPENDENT_AMBULATORY_CARE_PROVIDER_SITE_OTHER): Payer: Medicaid Other | Admitting: *Deleted

## 2021-02-09 ENCOUNTER — Other Ambulatory Visit: Payer: Self-pay

## 2021-02-09 DIAGNOSIS — Z5181 Encounter for therapeutic drug level monitoring: Secondary | ICD-10-CM

## 2021-02-09 DIAGNOSIS — I236 Thrombosis of atrium, auricular appendage, and ventricle as current complications following acute myocardial infarction: Secondary | ICD-10-CM

## 2021-02-09 LAB — POCT INR: INR: 1.8 — AB (ref 2.0–3.0)

## 2021-02-09 NOTE — Patient Instructions (Signed)
Description   Since you took extra dose yesterday, continue taking warfarin 2 tablets daily except for 1 tablet on Sundays and Thursdays. Recheck INR in 2 weeks.  Coumadin Clinic [phone].

## 2021-02-24 ENCOUNTER — Other Ambulatory Visit (HOSPITAL_COMMUNITY): Payer: Self-pay

## 2021-02-24 ENCOUNTER — Ambulatory Visit (INDEPENDENT_AMBULATORY_CARE_PROVIDER_SITE_OTHER): Payer: Medicaid Other | Admitting: *Deleted

## 2021-02-24 ENCOUNTER — Telehealth (HOSPITAL_COMMUNITY): Payer: Self-pay | Admitting: Pharmacy Technician

## 2021-02-24 ENCOUNTER — Other Ambulatory Visit: Payer: Self-pay

## 2021-02-24 DIAGNOSIS — Z5181 Encounter for therapeutic drug level monitoring: Secondary | ICD-10-CM | POA: Diagnosis not present

## 2021-02-24 DIAGNOSIS — I236 Thrombosis of atrium, auricular appendage, and ventricle as current complications following acute myocardial infarction: Secondary | ICD-10-CM

## 2021-02-24 LAB — POCT INR: INR: 3.4 — AB (ref 2.0–3.0)

## 2021-02-24 NOTE — Patient Instructions (Signed)
Description   Do not take any Warfarin today then continue taking warfarin 2 tablets daily except for 1 tablet on Sundays and Thursdays. Recheck INR in 2 weeks.  [facility] [phone].

## 2021-02-24 NOTE — Telephone Encounter (Signed)
Advanced Heart Failure Patient Advocate Encounter   Received notification from Medicaid that prior authorization for Sherryll Burger is required.   PA submitted on [employer] [id number] W Status is pending   Will continue to follow.

## 2021-02-26 NOTE — Telephone Encounter (Signed)
Advanced Heart Failure Patient Advocate Encounter  Prior Authorization for Sherryll Burger has been approved.    PA# [id number] Effective dates: 02/24/21 through 02/19/22  Archer Asa, CPhT

## 2021-03-04 ENCOUNTER — Ambulatory Visit (HOSPITAL_COMMUNITY): Payer: Medicaid Other

## 2021-03-04 ENCOUNTER — Encounter (HOSPITAL_COMMUNITY): Payer: Medicaid Other | Admitting: Cardiology

## 2021-03-10 DIAGNOSIS — F439 Reaction to severe stress, unspecified: Secondary | ICD-10-CM | POA: Diagnosis not present

## 2021-03-10 DIAGNOSIS — F411 Generalized anxiety disorder: Secondary | ICD-10-CM | POA: Diagnosis not present

## 2021-03-12 ENCOUNTER — Other Ambulatory Visit: Payer: Self-pay

## 2021-03-12 ENCOUNTER — Ambulatory Visit (INDEPENDENT_AMBULATORY_CARE_PROVIDER_SITE_OTHER): Payer: Medicaid Other

## 2021-03-12 DIAGNOSIS — I236 Thrombosis of atrium, auricular appendage, and ventricle as current complications following acute myocardial infarction: Secondary | ICD-10-CM

## 2021-03-12 DIAGNOSIS — Z5181 Encounter for therapeutic drug level monitoring: Secondary | ICD-10-CM | POA: Diagnosis not present

## 2021-03-12 LAB — POCT INR: INR: 1.4 — AB (ref 2.0–3.0)

## 2021-03-12 NOTE — Patient Instructions (Signed)
Description   Take 3 tablets today, then resume same dosage of warfarin 2 tablets daily except for 1 tablet on Sundays and Thursdays. Recheck INR in 2 weeks.  [facility] [phone].

## 2021-03-31 ENCOUNTER — Other Ambulatory Visit: Payer: Self-pay | Admitting: Internal Medicine

## 2021-03-31 ENCOUNTER — Ambulatory Visit (INDEPENDENT_AMBULATORY_CARE_PROVIDER_SITE_OTHER): Payer: Medicaid Other | Admitting: *Deleted

## 2021-03-31 ENCOUNTER — Other Ambulatory Visit: Payer: Self-pay

## 2021-03-31 DIAGNOSIS — Z5181 Encounter for therapeutic drug level monitoring: Secondary | ICD-10-CM | POA: Diagnosis not present

## 2021-03-31 DIAGNOSIS — F411 Generalized anxiety disorder: Secondary | ICD-10-CM | POA: Diagnosis not present

## 2021-03-31 DIAGNOSIS — I236 Thrombosis of atrium, auricular appendage, and ventricle as current complications following acute myocardial infarction: Secondary | ICD-10-CM

## 2021-03-31 DIAGNOSIS — F439 Reaction to severe stress, unspecified: Secondary | ICD-10-CM | POA: Diagnosis not present

## 2021-03-31 LAB — POCT INR: INR: 1.8 — AB (ref 2.0–3.0)

## 2021-03-31 MED ORDER — WARFARIN SODIUM 5 MG PO TABS: ORAL_TABLET | ORAL | 1 refills | Status: DC

## 2021-03-31 NOTE — Patient Instructions (Signed)
Description   Take 3 tablets today, then resume same dosage of warfarin 2 tablets daily except for 1 tablet on Sundays and Thursdays. Recheck INR in 2 weeks.  [facility] [phone].      

## 2021-04-13 DIAGNOSIS — F439 Reaction to severe stress, unspecified: Secondary | ICD-10-CM | POA: Diagnosis not present

## 2021-04-13 DIAGNOSIS — F411 Generalized anxiety disorder: Secondary | ICD-10-CM | POA: Diagnosis not present

## 2021-04-21 ENCOUNTER — Ambulatory Visit (INDEPENDENT_AMBULATORY_CARE_PROVIDER_SITE_OTHER): Payer: Medicaid Other | Admitting: *Deleted

## 2021-04-21 ENCOUNTER — Other Ambulatory Visit: Payer: Self-pay

## 2021-04-21 DIAGNOSIS — I236 Thrombosis of atrium, auricular appendage, and ventricle as current complications following acute myocardial infarction: Secondary | ICD-10-CM

## 2021-04-21 DIAGNOSIS — Z5181 Encounter for therapeutic drug level monitoring: Secondary | ICD-10-CM

## 2021-04-21 LAB — POCT INR: INR: 3.1 — AB (ref 2.0–3.0)

## 2021-04-21 NOTE — Patient Instructions (Signed)
Description   Today take 1 tablet then continue taking warfarin 2 tablets daily except for 1 tablet on Sundays and Thursdays. Recheck INR in 3 weeks. [facility] [phone].

## 2021-04-29 ENCOUNTER — Other Ambulatory Visit (HOSPITAL_COMMUNITY): Payer: Self-pay

## 2021-04-29 ENCOUNTER — Telehealth (HOSPITAL_COMMUNITY): Payer: Self-pay | Admitting: Pharmacy Technician

## 2021-04-29 NOTE — Telephone Encounter (Signed)
Patient Advocate Encounter   Received notification from Medicaid that prior authorization for Sheila Estes is required.   PA submitted on [employer]acks Key [id number] W Status is pending   Will continue to follow.

## 2021-04-29 NOTE — Telephone Encounter (Signed)
Advanced Heart Failure Patient Advocate Encounter  Prior Authorization for Marcelline Deist has been approved.    PA#  [id number] Effective dates: 04/29/21 through 04/29/22  Called and spoke with the patient.   Archer Asa, CPhT

## 2021-05-12 ENCOUNTER — Other Ambulatory Visit: Payer: Self-pay

## 2021-05-12 ENCOUNTER — Ambulatory Visit (INDEPENDENT_AMBULATORY_CARE_PROVIDER_SITE_OTHER): Payer: Medicaid Other | Admitting: *Deleted

## 2021-05-12 DIAGNOSIS — Z5181 Encounter for therapeutic drug level monitoring: Secondary | ICD-10-CM

## 2021-05-12 DIAGNOSIS — I236 Thrombosis of atrium, auricular appendage, and ventricle as current complications following acute myocardial infarction: Secondary | ICD-10-CM

## 2021-05-12 LAB — POCT INR: INR: 1.9 — AB (ref 2.0–3.0)

## 2021-05-12 NOTE — Patient Instructions (Signed)
Description   Today take 2.5 tablets then continue taking warfarin 2 tablets daily except for 1 tablet on Sundays and Thursdays. Recheck INR in 3 weeks. [facility] Clinic [phone].

## 2021-05-22 ENCOUNTER — Encounter (HOSPITAL_BASED_OUTPATIENT_CLINIC_OR_DEPARTMENT_OTHER): Payer: Self-pay | Admitting: Emergency Medicine

## 2021-05-22 ENCOUNTER — Other Ambulatory Visit: Payer: Self-pay

## 2021-05-22 ENCOUNTER — Emergency Department (HOSPITAL_BASED_OUTPATIENT_CLINIC_OR_DEPARTMENT_OTHER)
Admission: EM | Admit: 2021-05-22 | Discharge: 2021-05-22 | Disposition: A | Discharge status: Home / Self Care | Payer: Medicaid Other | Attending: Emergency Medicine | Admitting: Emergency Medicine

## 2021-05-22 ENCOUNTER — Emergency Department (HOSPITAL_BASED_OUTPATIENT_CLINIC_OR_DEPARTMENT_OTHER): Payer: Medicaid Other

## 2021-05-22 DIAGNOSIS — Z79899 Other long term (current) drug therapy: Secondary | ICD-10-CM | POA: Diagnosis not present

## 2021-05-22 DIAGNOSIS — J45909 Unspecified asthma, uncomplicated: Secondary | ICD-10-CM | POA: Diagnosis not present

## 2021-05-22 DIAGNOSIS — I110 Hypertensive heart disease with heart failure: Secondary | ICD-10-CM | POA: Diagnosis not present

## 2021-05-22 DIAGNOSIS — Z7951 Long term (current) use of inhaled steroids: Secondary | ICD-10-CM | POA: Diagnosis not present

## 2021-05-22 DIAGNOSIS — I5021 Acute systolic (congestive) heart failure: Secondary | ICD-10-CM | POA: Insufficient documentation

## 2021-05-22 DIAGNOSIS — R079 Chest pain, unspecified: Secondary | ICD-10-CM

## 2021-05-22 DIAGNOSIS — J101 Influenza due to other identified influenza virus with other respiratory manifestations: Secondary | ICD-10-CM | POA: Diagnosis not present

## 2021-05-22 DIAGNOSIS — Z7901 Long term (current) use of anticoagulants: Secondary | ICD-10-CM | POA: Insufficient documentation

## 2021-05-22 DIAGNOSIS — R0602 Shortness of breath: Secondary | ICD-10-CM | POA: Diagnosis present

## 2021-05-22 DIAGNOSIS — Z20822 Contact with and (suspected) exposure to covid-19: Secondary | ICD-10-CM | POA: Insufficient documentation

## 2021-05-22 LAB — RESP PANEL BY RT-PCR (FLU A&B, COVID) ARPGX2
Influenza A by PCR: POSITIVE — AB
Influenza B by PCR: NEGATIVE
SARS Coronavirus 2 by RT PCR: NEGATIVE

## 2021-05-22 MED ORDER — OSELTAMIVIR PHOSPHATE 75 MG PO CAPS: 75.0000 mg | ORAL_CAPSULE | Freq: Two times a day (BID) | ORAL | 0 refills | Status: DC

## 2021-05-22 MED ORDER — IPRATROPIUM-ALBUTEROL 0.5-2.5 (3) MG/3ML IN SOLN
3.0000 mL | Freq: Once | RESPIRATORY_TRACT | Status: AC
  Administered 2021-05-22: 3 mL via RESPIRATORY_TRACT
  Filled 2021-05-22: qty 3

## 2021-05-22 MED ORDER — AEROCHAMBER PLUS FLO-VU MEDIUM MISC
1.0000 | Freq: Once | Status: AC
  Administered 2021-05-22: 1
  Filled 2021-05-22: qty 1

## 2021-05-22 MED ORDER — ALBUTEROL SULFATE (2.5 MG/3ML) 0.083% IN NEBU
INHALATION_SOLUTION | RESPIRATORY_TRACT | Status: AC
  Administered 2021-05-22: 2.5 mg
  Filled 2021-05-22: qty 3

## 2021-05-22 MED ORDER — ALBUTEROL SULFATE HFA 108 (90 BASE) MCG/ACT IN AERS
2.0000 | INHALATION_SPRAY | RESPIRATORY_TRACT | Status: DC | PRN
  Administered 2021-05-22: 2 via RESPIRATORY_TRACT
  Filled 2021-05-22 (×2): qty 6.7

## 2021-05-22 MED ORDER — PREDNISONE 50 MG PO TABS: 50.0000 mg | ORAL_TABLET | Freq: Every day | ORAL | 0 refills | Status: AC

## 2021-05-22 MED ORDER — ACETAMINOPHEN 325 MG PO TABS
650.0000 mg | ORAL_TABLET | Freq: Once | ORAL | Status: AC
  Administered 2021-05-22: 650 mg via ORAL
  Filled 2021-05-22: qty 2

## 2021-05-22 MED ORDER — IPRATROPIUM-ALBUTEROL 0.5-2.5 (3) MG/3ML IN SOLN: 3.0000 mL | Freq: Four times a day (QID) | RESPIRATORY_TRACT | 0 refills | Status: DC | PRN

## 2021-05-22 NOTE — ED Notes (Signed)
RT educated pt on proper use of MDI w/spacer as well as proper administration of medication for better deposition for relief of symptoms. RT suggested pt seek treatment and PFT from pulmonologist for better treatment of Asthma. Pt given MDI and spacer to go home with at this time. Pt respiratory status stable w/no distress noted at this time.

## 2021-05-22 NOTE — Discharge Instructions (Signed)
You were given a prescription for Tamiflu.  Be aware that this medication may make you have nausea, vomiting, abdominal pain, or diarrhea.  This medication can also cause mood derangement. If you experience any side effects that you cannot tolerate, then you should stop taking the medication. Please make sure to take stay hydrated while you are taking this medication.  Take prednisone as directed   Use the nebulizer treatment every 4-6 hours as needed

## 2021-05-22 NOTE — ED Triage Notes (Signed)
Pt POV reports inc ShOB since yesterday, reports it feels like her asthma exacerbations. Pt has received 1 breathing tx already in ED, reports some ongoing tightness but breathing has improved. Has home inhaler with no relief.

## 2021-05-22 NOTE — ED Notes (Signed)
D/c paperwork reviewed with pt, including prescriptions. Pt educated on sx treatment at home for flu. No questions or concerns at time of d/c. Pt ambulatory to exit on RA, NAD.

## 2021-05-22 NOTE — ED Notes (Signed)
Pt ambulated in room appx 30 feet on RA. Starting O2 sats 96%, lowest O2 sat while ambulating 95%.

## 2021-05-22 NOTE — ED Notes (Signed)
Pt reports she does not want chest xray at this time, EDP Cortni made aware.

## 2021-05-22 NOTE — ED Provider Notes (Signed)
[facility] EMERGENCY DEPT Provider Note   CSN: [id number] Arrival date & time: 05/22/21  1747     History Chief Complaint  Patient presents with   Shortness of Breath    Sheila Estes is a 30 y.o. female.  HPI  30 year old female with a history of systolic heart failure, asthma, bipolar 1 disorder, depression, trichomonas, hypertension, pneumonia, spontaneous dissection of the coronary LAD, who presents the emergency department today for evaluation of cough, wheezing, shortness of breath and fevers that started over the last 24 hours.  She states that her asthma is flaring up and does this every time that the weather changes.  She does not feel she is getting much relief from her albuterol inhalers at home.  She does not report any chest pain.  She denies any lower extremity swelling.  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    Patient Active Problem List   Diagnosis Date Noted   Encounter for therapeutic drug monitoring 09/06/2018   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   Ischemic cardiomyopathy 09/03/2018   Left ventricular apical thrombus following MI ([place]) 09/03/2018   Anticoagulation goal of INR 2 to 3 0000000   Acute systolic HF (heart failure) ([place]) 08/28/2018   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   Poor compliance 08/17/2016   Abnormal Pap smear of cervix 08/17/2016   BMI 40.0-44.9, adult ([place]) 08/17/2016    Past  Surgical History:  Procedure Laterality Date   ARTHROSCOPTIC SHOULDER Left 2007   CERVICAL CONIZATION W/BX N/A 12/14/2016   Procedure: COLD KNIFE CONIZATION, REMOVAL OF BODY JEWELRY;  Surgeon: Aletha Halim, MD;  Location: [facility];  Service: Gynecology;  Laterality: N/A;   RIGHT/LEFT HEART CATH AND CORONARY ANGIOGRAPHY N/A 08/30/2018   Procedure: RIGHT/LEFT HEART CATH AND CORONARY ANGIOGRAPHY;  Surgeon: Larey Dresser, MD;  Location: [facility];  Service: Cardiovascular;  Laterality: N/A;     OB History     Gravida  1   Para  1   Term  1   Preterm      AB      Living  1      SAB      IAB      Ectopic      Multiple      Live Births  1        Obstetric Comments  SVD x 1         Family History  Problem Relation Age of Onset   Hypertension Mother    Hypertension Father    Diabetes Maternal Grandmother    Hypertension Maternal Grandfather     Social History   Tobacco Use   Smoking status: Never   Smokeless tobacco: Never  Vaping Use   Vaping Use: Never used  Substance Use Topics   Alcohol use: Yes    Comment: socially wine   Drug  use: No    Home Medications Prior to Admission medications   Medication Sig Start Date End Date Taking? Authorizing Provider  ipratropium-albuterol (DUONEB) 0.5-2.5 (3) MG/3ML SOLN Take 3 mLs by nebulization every 6 (six) hours as needed. 05/22/21  Yes Kelsie Kramp S, PA-C  oseltamivir (TAMIFLU) 75 MG capsule Take 1 capsule (75 mg total) by mouth every 12 (twelve) hours. 05/22/21  Yes Juanluis Guastella S, PA-C  predniSONE (DELTASONE) 50 MG tablet Take 1 tablet (50 mg total) by mouth daily for 5 days. 05/22/21 05/27/21 Yes Shanetha Bradham S, PA-C  albuterol (VENTOLIN HFA) 108 (90 Base) MCG/ACT inhaler Inhale 2 puffs into the lungs every 6 (six) hours as needed for wheezing or shortness of breath. 06/05/20   Kerin Perna, NP  atorvastatin (LIPITOR) 80 MG tablet Take 1 tablet (80 mg total) by mouth daily at 6  PM. 11/06/20   Larey Dresser, MD  carvedilol (COREG) 12.5 MG tablet Take 1 tablet (12.5 mg total) by mouth 2 (two) times daily with a meal. 11/06/20   Larey Dresser, MD  dapagliflozin propanediol (FARXIGA) 10 MG TABS tablet Take 1 tablet (10 mg total) by mouth daily before breakfast. 11/06/20   Larey Dresser, MD  fluticasone (FLOVENT HFA) 110 MCG/ACT inhaler Inhale 2 puffs into the lungs 2 (two) times daily at 10 AM and 5 PM. 06/05/20   Kerin Perna, NP  furosemide (LASIX) 20 MG tablet Take 1 tablet (20 mg total) by mouth daily. 11/06/20   Larey Dresser, MD  potassium chloride SA (KLOR-CON) 20 MEQ tablet Take 1 tablet (20 mEq total) by mouth daily. With every LASIX dose 11/06/20   Larey Dresser, MD  sacubitril-valsartan (ENTRESTO) 97-103 MG Take 1 tablet by mouth 2 (two) times daily. 01/16/21   Rafael Bihari, FNP  spironolactone (ALDACTONE) 25 MG tablet Take 1 tablet (25 mg total) by mouth at bedtime. 11/06/20   Larey Dresser, MD  warfarin (COUMADIN) 5 MG tablet TAKE AS DIRECTED BY ANTICOAGUALTION CLINIC 03/31/21   Bensimhon, Shaune Pascal, MD    Allergies    Other and Gluten meal  Review of Systems   Review of Systems  Constitutional:  Positive for fever.  HENT:  Negative for ear pain and sore throat.   Eyes:  Negative for visual disturbance.  Respiratory:  Positive for cough, shortness of breath and wheezing.   Cardiovascular:  Negative for chest pain and leg swelling.  Gastrointestinal:  Negative for abdominal pain and vomiting.  Genitourinary:  Negative for hematuria.  Musculoskeletal:  Positive for myalgias.  Skin:  Negative for color change and rash.  Neurological:  Positive for headaches.  All other systems reviewed and are negative.  Physical Exam Updated Vital Signs BP (!) 141/69   Pulse (!) 105   Temp (!) 101.8 F (38.8 C)   Resp (!) 4   Ht 5\' 2"  (1.575 m)   Wt 97.5 kg   LMP 05/19/2021   SpO2 96%   BMI 39.32 kg/m   Physical Exam Vitals and nursing note  reviewed.  Constitutional:      General: She is not in acute distress.    Appearance: She is well-developed.  HENT:     Head: Normocephalic and atraumatic.  Eyes:     Conjunctiva/sclera: Conjunctivae normal.  Cardiovascular:     Rate and Rhythm: Normal rate and regular rhythm.     Heart sounds: Normal heart sounds. No murmur heard. Pulmonary:     Effort: Pulmonary effort  is normal. No respiratory distress.     Breath sounds: Wheezing present. No rales.  Abdominal:     Palpations: Abdomen is soft.     Tenderness: There is no abdominal tenderness.  Musculoskeletal:        General: No swelling.     Cervical back: Neck supple.     Right lower leg: No edema.     Left lower leg: No edema.  Skin:    General: Skin is warm and dry.     Capillary Refill: Capillary refill takes less than 2 seconds.  Neurological:     Mental Status: She is alert.  Psychiatric:        Mood and Affect: Mood normal.    ED Results / Procedures / Treatments   Labs (all labs ordered are listed, but only abnormal results are displayed) Labs Reviewed  RESP PANEL BY RT-PCR (FLU A&B, COVID) ARPGX2 - Abnormal; Notable for the following components:      Result Value   Influenza A by PCR POSITIVE (*)    All other components within normal limits    EKG None  Radiology No results found.  Procedures Procedures   Medications Ordered in ED Medications  albuterol (VENTOLIN HFA) 108 (90 Base) MCG/ACT inhaler 2 puff (2 puffs Inhalation Given 05/22/21 1832)  ipratropium-albuterol (DUONEB) 0.5-2.5 (3) MG/3ML nebulizer solution 3 mL (has no administration in time range)  AeroChamber Plus Flo-Vu Medium MISC 1 each (has no administration in time range)  albuterol (PROVENTIL) (2.5 MG/3ML) 0.083% nebulizer solution (2.5 mg  Given 05/22/21 1808)  acetaminophen (TYLENOL) tablet 650 mg (650 mg Oral Given 05/22/21 2042)    ED Course  I have reviewed the triage vital signs and the nursing notes.  Pertinent labs &  imaging results that were available during my care of the patient were reviewed by me and considered in my medical decision making (see chart for details).     Durable Medical Equipment  (From admission, onward)           Start     Ordered   05/22/21 0000  For home use only DME Nebulizer machine       Question Answer Comment  Patient needs a nebulizer to treat with the following condition Asthma   Length of Need Lifetime      05/22/21 2125                MDM Rules/Calculators/A&P                          30 year old female presents the emergency department today for evaluation of fevers, cough, wheezing and shortness of breath.  This started over the last 24 hours.  She tested positive for flu a here in the emergency department.  Negative for COVID.  I ordered a chest x-ray however the patient declined.  I additionally recommended that we at least check her INR however she declined this to.  She states she will get it checked on Monday.  I will give her prescription for Tamiflu given she is high risk for severe disease.  We will also give symptomatic medication.  She was given nebulizer treatments in the ED and had improvement of symptoms.  She is requesting nebulizer treatment for home.  Rx for this given.  We will also start her on prednisone.  Advised that she follow-up closely PCP and return to ED for new or worsening symptoms.  She voiced understanding the plan  and reasons to return. all Questions answered.  Patient stable for discharge.   Final Clinical Impression(s) / ED Diagnoses Final diagnoses:  Influenza A    Rx / DC Orders ED Discharge Orders          Ordered    oseltamivir (TAMIFLU) 75 MG capsule  Every 12 hours        05/22/21 2125    predniSONE (DELTASONE) 50 MG tablet  Daily        05/22/21 2125    For home use only DME Nebulizer machine        05/22/21 2125    ipratropium-albuterol (DUONEB) 0.5-2.5 (3) MG/3ML SOLN  Every 6 hours PRN        05/22/21  2125             Karrie Meres, PA-C 05/22/21 2126    Milagros Loll, MD 05/22/21 2330

## 2021-06-01 ENCOUNTER — Other Ambulatory Visit: Payer: Self-pay

## 2021-06-01 ENCOUNTER — Ambulatory Visit (INDEPENDENT_AMBULATORY_CARE_PROVIDER_SITE_OTHER): Payer: Medicaid Other | Admitting: *Deleted

## 2021-06-01 DIAGNOSIS — Z5181 Encounter for therapeutic drug level monitoring: Secondary | ICD-10-CM | POA: Diagnosis not present

## 2021-06-01 LAB — POCT INR: INR: 2.4 (ref 2.0–3.0)

## 2021-06-01 NOTE — Patient Instructions (Signed)
Description   Continue taking warfarin 2 tablets daily except for 1 tablet on Sundays and Thursdays. Recheck INR in 3 weeks. [facility]in Clinic [phone].

## 2021-06-12 ENCOUNTER — Other Ambulatory Visit: Payer: Self-pay

## 2021-06-12 ENCOUNTER — Encounter (HOSPITAL_COMMUNITY): Payer: Self-pay | Admitting: Cardiology

## 2021-06-12 ENCOUNTER — Ambulatory Visit (HOSPITAL_COMMUNITY)
Admission: RE | Admit: 2021-06-12 | Discharge: 2021-06-12 | Disposition: A | Discharge status: Home / Self Care | Payer: Medicaid Other | Source: Ambulatory Visit | Attending: Cardiology | Admitting: Cardiology

## 2021-06-12 ENCOUNTER — Ambulatory Visit (HOSPITAL_BASED_OUTPATIENT_CLINIC_OR_DEPARTMENT_OTHER)
Admission: RE | Admit: 2021-06-12 | Discharge: 2021-06-12 | Disposition: A | Discharge status: Home / Self Care | Payer: Medicaid Other | Source: Ambulatory Visit | Attending: Cardiology | Admitting: Cardiology

## 2021-06-12 VITALS — BP 108/68 | HR 86 | Wt 220.0 lb

## 2021-06-12 DIAGNOSIS — E669 Obesity, unspecified: Secondary | ICD-10-CM | POA: Diagnosis not present

## 2021-06-12 DIAGNOSIS — I2510 Atherosclerotic heart disease of native coronary artery without angina pectoris: Secondary | ICD-10-CM | POA: Insufficient documentation

## 2021-06-12 DIAGNOSIS — Z7901 Long term (current) use of anticoagulants: Secondary | ICD-10-CM | POA: Diagnosis not present

## 2021-06-12 DIAGNOSIS — I5022 Chronic systolic (congestive) heart failure: Secondary | ICD-10-CM | POA: Diagnosis not present

## 2021-06-12 DIAGNOSIS — I444 Left anterior fascicular block: Secondary | ICD-10-CM | POA: Diagnosis not present

## 2021-06-12 DIAGNOSIS — Z79899 Other long term (current) drug therapy: Secondary | ICD-10-CM | POA: Insufficient documentation

## 2021-06-12 DIAGNOSIS — Z7951 Long term (current) use of inhaled steroids: Secondary | ICD-10-CM | POA: Diagnosis not present

## 2021-06-12 DIAGNOSIS — Z6841 Body Mass Index (BMI) 40.0 and over, adult: Secondary | ICD-10-CM | POA: Diagnosis not present

## 2021-06-12 DIAGNOSIS — I255 Ischemic cardiomyopathy: Secondary | ICD-10-CM | POA: Diagnosis not present

## 2021-06-12 LAB — BASIC METABOLIC PANEL
Anion gap: 6 (ref 5–15)
BUN: 13 mg/dL (ref 6–20)
CO2: 25 mmol/L (ref 22–32)
Calcium: 9 mg/dL (ref 8.9–10.3)
Chloride: 106 mmol/L (ref 98–111)
Creatinine, Ser: 0.71 mg/dL (ref 0.44–1.00)
GFR, Estimated: >60 mL/min (ref >60)
Glucose, Bld: 89 mg/dL (ref 70–99)
Potassium: 3.7 mmol/L (ref 3.5–5.1)
Sodium: 137 mmol/L (ref 135–145)

## 2021-06-12 LAB — ECHOCARDIOGRAM COMPLETE
Area-P 1/2: 6.54 cm2
S' Lateral: 4.8 cm
Single Plane A4C EF: 33.5 %

## 2021-06-12 LAB — BRAIN NATRIURETIC PEPTIDE: B Natriuretic Peptide: 415.6 pg/mL — ABNORMAL HIGH (ref 0.0–100.0)

## 2021-06-12 MED ORDER — CARVEDILOL 12.5 MG PO TABS: 18.7500 mg | ORAL_TABLET | Freq: Two times a day (BID) | ORAL | 3 refills | Status: DC

## 2021-06-12 NOTE — Patient Instructions (Signed)
Medication Changes:  Increase Carvedilol to 18.75 mg (1 & 1/2 tabs) Twice daily   Lab Work:  Labs done today, your results will be available in MyChart, we will contact you for abnormal readings.  Testing/Procedures:  None  Referrals:  --You have been referred to [facility] for defibrillator placement, they will call you to schedule  --You have been referred to [facility] pharmacy team for weight loss medication, they will call you to discuss this  Special Instructions // Education:  Do the following things EVERYDAY: Weigh yourself in the morning before breakfast. Write it down and keep it in a log. Take your medicines as prescribed Eat low salt foods--Limit salt (sodium) to 2000 mg per day.  Stay as active as you can everyday Limit all fluids for the day to less than 2 liters   Follow-Up in: 3 months   At the [facility], you and your health needs are our priority. We have a designated team specialized in the treatment of Heart Failure. This Care Team includes your primary Heart Failure Specialized Cardiologist (physician), Advanced Practice Providers (APPs- Physician Assistants and Nurse Practitioners), and Pharmacist who all work together to provide you with the care you need, when you need it.   You may see any of the following providers on your designated Care Team at your next follow up:  Dr Arvilla Meres Dr Carron Curie, NP Robbie Lis, [place] [facility] [place], [place] Karle Plumber, PharmD   Please be sure to bring in all your medications bottles to every appointment.   Need to Contact us:  If you have any questions or concerns before your next appointment please send [place] a message through [place] or call our office at [phone].    TO LEAVE A MESSAGE FOR THE NURSE SELECT OPTION 2, PLEASE LEAVE A MESSAGE INCLUDING: YOUR NAME DATE OF BIRTH CALL BACK NUMBER REASON FOR CALL**this is important as we  prioritize the call backs  YOU WILL RECEIVE A CALL BACK THE SAME DAY AS LONG AS YOU CALL BEFORE 4:00 PM

## 2021-06-12 NOTE — Progress Notes (Signed)
  Echocardiogram 2D Echocardiogram has been performed.  Delcie Roch 06/12/2021, 1:56 PM

## 2021-06-14 NOTE — Progress Notes (Signed)
Date:  06/14/2021   ID:  Sheila Estes, DOB [birth year], MRN [id number]   Provider location: [facility] Type of Visit: Established patient   PCP:  Grayce Sessions, NP  Cardiologist:  Dr. Shirlee Latch  Chief Complaint: F/u for Chronic Systolic Heart Failure    History of Present Illness: Sheila Estes is a 30 y.o. with a history of history of obesity, anxiety, asthma, bipolar disorder, depression, and CAD, chronic systolic heart failure.  Admitted 2/23-09/04/18 with pulmonary edema. She had been short of breath for about a month, which started after drinking 2 Red Bulls. She had been seen at Urgent Care and [facility] ER and was treated for asthma and for PNA. Echo at 2/20 admission showed newly reduced EF 25-30% with peri-apical akinesis and apical thrombus. Transferred to [facility] and had LHC/RHC, which showed normal filling pressures and preserved cardiac output.  The proximal LAD was subtotally occluded, it looked like a SCAD event. Cardiac MRI suggested that LAD territory was infarcted and unlikely to be viable with restoration of perfusion. HF medications were optimized and she was set up with LifeVest for discharge. She was started on coumadin for LV thrombus. DC weight: 198 lbs.  Seen in the [facility] 02/2019. At that time she had self discontinued her Lifevest and had returned it. Echo was repeated at visit and EF remained severely reduced at 20-25% and RV was moderately reduced.   On coumadin for LV thrombus. INRs followed by [facility] coumadin clinic.   Echo was done today and reviewed, EF 25-30%, peri-apical akinesis, no LV thrombus, mildly decreased RV systolic function, normal IVC.   She returns for followup of CHF.  Weight is up 3 lbs.  She has been exercising regularly. No dyspnea walking up to her 3rd floor apt. She denies dyspnea with her usual activities.  No chest pain.  No orthopnea/PND.  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    We discussed  importance of avoiding pregnancy on her current meds.   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 Depression 2. Anxiety 3. Asthma 4. Chronic systolic CHF: Ischemic cardiomyopathy.   - Echo (2/20) with EF 25-30%, peri-apical akinesis, LV thrombus.  - LHC/RHC (2/20): Subtotal LAD occlusion after S1.  Appearance of LAD suggests SCAD.  Mean RA 1, PA 36/8 mean 23, mean PCWP 12, CI 2.46.  - Cardiac MRI (2/20): Mildly dilated LV with EF 27%, delayed enhancement images suggest a dense LAD-territory infarct (myocardium unlikely to be viable), LV apical thrombus, RV EF 31%.  - Echo (12/22): EF 25-30%, peri-apical akinesis, no LV thrombus, mildly decreased RV systolic function, normal IVC.  5. CAD: LHC (2/20) with subtotal LAD occlusion after S1.  Appearance of LAD suggests SCAD.  Current Outpatient Medications  Medication Sig Dispense Refill   albuterol (VENTOLIN HFA) 108 (90 Estes) MCG/ACT inhaler Inhale 2 puffs into the lungs every 6 (six) hours as needed for wheezing or shortness of breath. 1 each 2   atorvastatin (LIPITOR) 80 MG tablet Take 1 tablet (80 mg total) by mouth daily at 6 PM. 90 tablet 3   dapagliflozin propanediol (FARXIGA) 10 MG TABS tablet Take 1 tablet (10 mg total) by mouth daily before breakfast. 90 tablet 3   fluticasone (FLOVENT HFA) 110 MCG/ACT inhaler  Inhale 2 puffs into the lungs 2 (two) times daily at 10 AM and 5 PM. 1 each 12   furosemide (LASIX) 20 MG tablet Take 1 tablet (20 mg total) by mouth daily. 90 tablet 3   ipratropium-albuterol (DUONEB) 0.5-2.5 (3) MG/3ML SOLN Take 3 mLs by nebulization every 6 (six) hours as needed. 360 mL 0   potassium chloride SA (KLOR-CON) 20 MEQ tablet Take 1 tablet (20 mEq total) by mouth daily. With every LASIX dose 90 tablet 3   sacubitril-valsartan (ENTRESTO)  97-103 MG Take 1 tablet by mouth 2 (two) times daily. 60 tablet 11   spironolactone (ALDACTONE) 25 MG tablet Take 1 tablet (25 mg total) by mouth at bedtime. 90 tablet 3   warfarin (COUMADIN) 5 MG tablet TAKE AS DIRECTED BY ANTICOAGUALTION CLINIC 60 tablet 1   carvedilol (COREG) 12.5 MG tablet Take 1.5 tablets (18.75 mg total) by mouth 2 (two) times daily with a meal. 270 tablet 3   No current facility-administered medications for this encounter.    Allergies:   Other and Gluten meal   Social History:  The patient  reports that she has never smoked. She has never used smokeless tobacco. She reports current alcohol use. She reports that she does not use drugs.   Family History:  The patient's family history includes Diabetes in her maternal grandmother; Hypertension in her father, maternal grandfather, and mother.   ROS:  Please see the history of present illness.   All other systems are personally reviewed and negative.  Vitals:   06/12/21 1348  BP: 108/68  Pulse: 86  SpO2: 98%   General: NAD Neck: No JVD, no thyromegaly or thyroid nodule.  Lungs: Clear to auscultation bilaterally with normal respiratory effort. CV: Nondisplaced PMI.  Heart regular S1/S2, no S3/S4, no murmur.  No peripheral edema.  No carotid bruit.  Normal pedal pulses.  Abdomen: Soft, nontender, no hepatosplenomegaly, no distention.  Skin: Intact without lesions or rashes.  Neurologic: Alert and oriented x 3.  Psych: Normal affect. Extremities: No clubbing or cyanosis.  HEENT: Normal.   Recent Labs: 06/12/2021: B Natriuretic Peptide 415.6; BUN 13; Creatinine, Ser 0.71; Potassium 3.7; Sodium 137     Wt Readings from Last 3 Encounters:  06/12/21 99.8 kg (220 lb)  05/22/21 97.5 kg (215 lb)  01/16/21 98.4 kg (217 lb)    ASSESSMENT AND PLAN:  1. 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  RHC in 2/20  showed normal filling pressures and preserved cardiac output.  Cardiac MRI in 2/20 suggested that LAD territory was infarcted and unlikely to be viable with restoration of perfusion. Repeat Echo 02/2019, EF 20-25% RV was moderately reduced.  Echo today showed EF still 25-30% with peri-apical akinesis.  NYHA class II symptoms.  She is not volume overloaded on exam.  - Continue Entresto 97/103 bid. BMET today.  - Continue Farxiga 10 mg daily  - Continue Lasix 20 mg daily.  - Continue Spironolactone 25 mg daily  - Increase Coreg to 18.75 mg bid.  - consider future addition of Bidil if BP can tolerate - We discussed again today the need to avoid pregnancy given teratogenic potentional of cardiac meds. She understands she need to contact HF clinic if she is gets pregnant so we adjust HF medications.  - As above, LAD territory is unlikely to be viable based on MRI, so would not pursue CTO procedure  or CABG.  - She is now willing to consider ICD.  With narrow QRS, not CRT candidate.  I will refer her to EP.  2. H/o LV thrombus: On warfarin.  No overt bleeding.  3. 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 No chest pain.  - On warfarin so no ASA.  - On high dose statin, needs lipids checked.   - LAD territory is unlikely to be viable based on MRI, so would not pursue CTO procedure or CABG. 4. Obesity: Working on weight loss.  - I will refer her to pharmacy clinic for semaglutide for weight loss.   Followup 3 months with APP.   Signed, Marca Ancona, MD  06/14/2021  [facility] [address] [facility] [place] [address] [phone] (office) [phone] (fax)

## 2021-06-15 ENCOUNTER — Telehealth (HOSPITAL_COMMUNITY): Payer: Self-pay | Admitting: Pharmacy Technician

## 2021-06-15 ENCOUNTER — Other Ambulatory Visit (HOSPITAL_COMMUNITY): Payer: Self-pay

## 2021-06-15 NOTE — Telephone Encounter (Signed)
Patient Advocate Encounter   Received notification from [facility] that prior authorization for Sherryll Burger is required.   PA submitted on CoverMyMeds Key [id number] Status is pending   Will continue to follow.

## 2021-06-15 NOTE — Telephone Encounter (Addendum)
Advanced Heart Failure Patient Advocate Encounter  Prior Authorization for Sherryll Burger has been approved.    PA# [id number] Effective dates: 06/15/21 through 06/15/22  Called and spoke with the patient.   Archer Asa, CPhT

## 2021-06-24 ENCOUNTER — Other Ambulatory Visit: Payer: Self-pay

## 2021-06-24 ENCOUNTER — Ambulatory Visit (INDEPENDENT_AMBULATORY_CARE_PROVIDER_SITE_OTHER): Payer: Medicaid Other | Admitting: *Deleted

## 2021-06-24 DIAGNOSIS — I236 Thrombosis of atrium, auricular appendage, and ventricle as current complications following acute myocardial infarction: Secondary | ICD-10-CM

## 2021-06-24 DIAGNOSIS — Z5181 Encounter for therapeutic drug level monitoring: Secondary | ICD-10-CM

## 2021-06-24 LAB — POCT INR: INR: 1.5 — AB (ref 2.0–3.0)

## 2021-06-24 NOTE — Patient Instructions (Signed)
Description   Today take 2.5 tablets and tomorrow take 1.5 tablets then continue taking warfarin 2 tablets daily except for 1 tablet on Sundays and Thursdays. Recheck INR in 2 weeks. [facility] [phone].

## 2021-07-10 DIAGNOSIS — I5022 Chronic systolic (congestive) heart failure: Secondary | ICD-10-CM | POA: Insufficient documentation

## 2021-07-13 ENCOUNTER — Encounter: Payer: Self-pay | Admitting: Internal Medicine

## 2021-07-13 ENCOUNTER — Ambulatory Visit: Payer: Medicaid Other | Admitting: Internal Medicine

## 2021-07-13 ENCOUNTER — Other Ambulatory Visit: Payer: Self-pay

## 2021-07-13 VITALS — BP 122/78 | HR 74 | Ht 62.0 in | Wt 216.8 lb

## 2021-07-13 DIAGNOSIS — Z01812 Encounter for preprocedural laboratory examination: Secondary | ICD-10-CM

## 2021-07-13 DIAGNOSIS — I255 Ischemic cardiomyopathy: Secondary | ICD-10-CM | POA: Diagnosis not present

## 2021-07-13 DIAGNOSIS — Z01818 Encounter for other preprocedural examination: Secondary | ICD-10-CM | POA: Diagnosis not present

## 2021-07-13 DIAGNOSIS — Z79899 Other long term (current) drug therapy: Secondary | ICD-10-CM

## 2021-07-13 DIAGNOSIS — I5022 Chronic systolic (congestive) heart failure: Secondary | ICD-10-CM | POA: Diagnosis not present

## 2021-07-13 NOTE — Patient Instructions (Signed)
Medication Instructions:  Your physician recommends that you continue on your current medications as directed. Please refer to the Current Medication list given to you today.  *If you need a refill on your cardiac medications before your next appointment, please call your pharmacy*   Lab Work: None ordered.  If you have labs (blood work) drawn today and your tests are completely normal, you will receive your results only by: MyChart Message (if you have MyChart) OR A paper copy in the mail If you have any lab test that is abnormal or we need to change your treatment, we will call you to review the results.   Testing/Procedures: Your physician has recommended that you have a defibrillator inserted. An implantable cardioverter defibrillator (ICD) is a small device that is placed in your chest or, in rare cases, your abdomen. This device uses electrical pulses or shocks to help control life-threatening, irregular heartbeats that could lead the heart to suddenly stop beating (sudden cardiac arrest). Leads are attached to the ICD that goes into your heart. This is done in the hospital and usually requires an overnight stay. Please see the instruction sheet given to you today for more information.    Follow-Up: At [facility], you and your health needs are our priority.  As part of our continuing mission to provide you with exceptional heart care, we have created designated Provider Care Teams.  These Care Teams include your primary Cardiologist (physician) and Advanced Practice Providers (APPs -  Physician Assistants and Nurse Practitioners) who all work together to provide you with the care you need, when you need it.  We recommend signing up for the patient portal called "MyChart".  Sign up information is provided on this After Visit Summary.  MyChart is used to connect with patients for Virtual Visits (Telemedicine).  Patients are able to view lab/test results, encounter notes, upcoming  appointments, etc.  Non-urgent messages can be sent to your provider as well.   To learn more about what you can do with MyChart, go to [email or web address].    Your next appointment:   To be scheduled   

## 2021-07-13 NOTE — Progress Notes (Signed)
ELECTROPHYSIOLOGY CONSULT NOTE  Patient ID: Sheila Estes, MRN: [id number], DOB/AGE: 33-Feb-1992 33 y.o. Admit date: (Not on file) Date of Consult: 07/13/2021  Primary Physician: Grayce SessionsEdwards, Michelle P, NP Primary Cardiologist: DM     Sheila Estes is a 31 y.o. female who is being seen today for the evaluation of ICD at the request of DM.    HPI Sheila Estes is a 31 y.o. female who presented to the hospital with acute congestive heart failure about 3 years ago.  She had had an antecedent chest pain event in the context of drinking energy drinks with subsequent shortness of breath treated initially with pneumonia.  Suddenly found to have significant LV dysfunction and underwent catheterization with findings most consistent with coronary artery dissection and a total LAD.  cMRI confirmed full-thickness infarction.  A thrombus was noted Coumadin was started.  LV function remains persistently decreased.  However, her symptoms now are markedly improved.  She is able to climb a flight of stairs.  Does not have nocturnal dyspnea orthopnea or peripheral edema.  No palpitations and no syncope.    The patient denies shortness of breath, nocturnal dyspnea, orthopnea or peripheral edema.  There have been no lightheadedness or syncope. Complains of heart failure history, anxiety, and asthma.  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   Past Medical History:  Diagnosis Date   Abnormal vaginal Pap smear    Acute systolic HF (heart failure) (HCC) 08/28/2018   Anticoagulation goal of INR 2 to 3 09/03/2018   Anxiety    Asthma    HX - rarely uses inhaler - seasonal   Bipolar 1 disorder (HCC)    CHF (congestive heart failure) (HCC)    Depression     History of depression    History of trichomoniasis    Hypertension    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/03/2018   Pneumonia    CHILDHOOD   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      Surgical History:  Past Surgical History:  Procedure Laterality Date   ARTHROSCOPTIC SHOULDER Left 2007   CERVICAL CONIZATION W/BX N/A 12/14/2016   Procedure: COLD KNIFE CONIZATION, REMOVAL OF BODY JEWELRY;  Surgeon: Seminole BingPickens, Charlie, MD;  Location: [facility];  Service: Gynecology;  Laterality: N/A;   RIGHT/LEFT HEART CATH AND CORONARY ANGIOGRAPHY N/A 08/30/2018   Procedure: RIGHT/LEFT HEART CATH AND CORONARY ANGIOGRAPHY;  Surgeon: Laurey MoraleMcLean, Dalton S, MD;  [facility];  Service: Cardiovascular;  Laterality: N/A;     Home Meds: Current Meds  Medication Sig   albuterol (VENTOLIN HFA) 108 (90 Base) MCG/ACT inhaler Inhale 2 puffs into the lungs every 6 (six) hours as needed for wheezing or shortness of breath.   atorvastatin (LIPITOR) 80 MG tablet Take 1 tablet (80 mg total) by mouth daily at 6 PM.   carvedilol (COREG) 12.5 MG tablet Take 1.5 tablets (18.75 mg total) by mouth 2 (two) times daily with a meal.   dapagliflozin propanediol (FARXIGA) 10 MG TABS tablet Take 1 tablet (10 mg total) by mouth daily before breakfast.  fluticasone (FLOVENT HFA) 110 MCG/ACT inhaler Inhale 2 puffs into the lungs 2 (two) times daily at 10 AM and 5 PM.   furosemide (LASIX) 20 MG tablet Take 1 tablet (20 mg total) by mouth daily.   ipratropium-albuterol (DUONEB) 0.5-2.5 (3) MG/3ML SOLN Take 3 mLs by nebulization every 6 (six) hours as needed.   potassium chloride SA (KLOR-CON) 20 MEQ tablet Take 1 tablet (20 mEq total) by mouth daily. With every LASIX dose   sacubitril-valsartan (ENTRESTO) 97-103 MG Take 1 tablet by mouth 2 (two) times daily.   spironolactone (ALDACTONE) 25 MG tablet Take 1 tablet (25 mg total) by mouth at bedtime.   warfarin (COUMADIN) 5 MG tablet TAKE AS  DIRECTED BY ANTICOAGUALTION CLINIC    Allergies:  Allergies  Allergen Reactions   Other Shortness Of Breath    Environmental allergies   Gluten Meal     Gluten makes her wheeze    Social History   Socioeconomic History   Marital status: Single    Spouse name: Not on file   Number of children: Not on file   Years of education: Not on file   Highest education level: Not on file  Occupational History   Not on file  Tobacco Use   Smoking status: Never   Smokeless tobacco: Never  Vaping Use   Vaping Use: Never used  Substance and Sexual Activity   Alcohol use: Yes    Comment: socially wine   Drug use: No   Sexual activity: Yes    Birth control/protection: Condom  Other Topics Concern   Not on file  Social History Narrative   Not on file   Social Determinants of Health   Financial Resource Strain: Not on file  Food Insecurity: Not on file  Transportation Needs: Not on file  Physical Activity: Not on file  Stress: Not on file  Social Connections: Not on file  Intimate Partner Violence: Not on file     Family History  Problem Relation Age of Onset   Hypertension Mother    Hypertension Father    Diabetes Maternal Grandmother    Hypertension Maternal Grandfather      ROS:  Please see the history of present illness.     All other systems reviewed and negative.    Physical Exam: Blood pressure 122/78, pulse 74, height 5\' 2"  (1.575 m), weight 216 lb 12.8 oz (98.3 kg), SpO2 98 %. General: Well developed, well nourished female in no acute distress. Head: Normocephalic, atraumatic, sclera non-icteric, no xanthomas, nares are without discharge. EENT: normal  Lymph Nodes:  none Neck: Negative for carotid bruits. JVD not elevated. Back:without scoliosis kyphosis Lungs: Clear bilaterally to auscultation without wheezes, rales, or rhonchi. Breathing is unlabored. Heart: RRR with S1 S2. No  systolic murmur . No rubs, or gallops appreciated. Abdomen: Soft, non-tender,  non-distended with normoactive bowel sounds. No hepatomegaly. No rebound/guarding. No obvious abdominal masses. Msk:  Strength and tone appear normal for age. Extremities: No clubbing or cyanosis. No  edema.  Distal pedal pulses are 2+ and equal bilaterally. Skin: Warm and Dry Neuro: Alert and oriented X 3. CN III-XII intact Grossly normal sensory and motor function . Psych:  Responds to questions appropriately with a normal affect.        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  We also discussed the potential risks related to ICD implantation and transvenous versus subcutaneous devices.  She would prefer a transvenous device.  We also discussed spontaneous coronary artery dissection; I am not sure how it relates to her energy drinks.  She remains on Coumadin for left ventricular thrombus.  INR's are quite variable.  Will review with Dr. DM.    Sherryl Manges

## 2021-07-13 NOTE — Telephone Encounter (Signed)
Spoke with pt and reviewed ICD Implant instructions.  See letter for complete details.  Pt verbalizes understanding and agrees with current plan.

## 2021-07-15 ENCOUNTER — Other Ambulatory Visit (INDEPENDENT_AMBULATORY_CARE_PROVIDER_SITE_OTHER): Payer: Self-pay | Admitting: Primary Care

## 2021-07-15 DIAGNOSIS — J4540 Moderate persistent asthma, uncomplicated: Secondary | ICD-10-CM

## 2021-07-23 ENCOUNTER — Other Ambulatory Visit: Payer: Self-pay

## 2021-07-23 ENCOUNTER — Ambulatory Visit (INDEPENDENT_AMBULATORY_CARE_PROVIDER_SITE_OTHER): Payer: Medicaid Other | Admitting: *Deleted

## 2021-07-23 DIAGNOSIS — I236 Thrombosis of atrium, auricular appendage, and ventricle as current complications following acute myocardial infarction: Secondary | ICD-10-CM | POA: Diagnosis not present

## 2021-07-23 DIAGNOSIS — Z5181 Encounter for therapeutic drug level monitoring: Secondary | ICD-10-CM | POA: Diagnosis not present

## 2021-07-23 LAB — POCT INR: INR: 1.4 — AB (ref 2.0–3.0)

## 2021-07-23 MED ORDER — WARFARIN SODIUM 5 MG PO TABS: ORAL_TABLET | ORAL | 0 refills | Status: DC

## 2021-07-23 NOTE — Patient Instructions (Signed)
Description   BILL [id number]; Since you missed dose take 1.5 tablets today and tomorrow take 2.5 tablets then continue taking warfarin 2 tablets daily except for 1 tablet on Sundays and Thursdays. Recheck INR in 11 days. Coumadin Clinic [phone].

## 2021-07-24 ENCOUNTER — Other Ambulatory Visit (HOSPITAL_COMMUNITY): Payer: Self-pay | Admitting: *Deleted

## 2021-07-24 DIAGNOSIS — J4540 Moderate persistent asthma, uncomplicated: Secondary | ICD-10-CM

## 2021-07-24 MED ORDER — ALBUTEROL SULFATE HFA 108 (90 BASE) MCG/ACT IN AERS: 2.0000 | INHALATION_SPRAY | Freq: Four times a day (QID) | RESPIRATORY_TRACT | 2 refills | Status: DC | PRN

## 2021-08-05 ENCOUNTER — Telehealth: Payer: Self-pay | Admitting: Pharmacist

## 2021-08-05 NOTE — Telephone Encounter (Signed)
Pt referred by Dr Aundra Dubin to initiate [id number] therapy for weight loss. She has Medicaid which will not cover Wegovy for weight loss. Will try submitting prior authorization for Ozempic. Pt does not have DM so will not likely be covered.

## 2021-08-06 ENCOUNTER — Other Ambulatory Visit: Payer: Self-pay

## 2021-08-06 ENCOUNTER — Ambulatory Visit (INDEPENDENT_AMBULATORY_CARE_PROVIDER_SITE_OTHER): Payer: Medicaid Other

## 2021-08-06 DIAGNOSIS — Z5181 Encounter for therapeutic drug level monitoring: Secondary | ICD-10-CM | POA: Diagnosis not present

## 2021-08-06 DIAGNOSIS — I236 Thrombosis of atrium, auricular appendage, and ventricle as current complications following acute myocardial infarction: Secondary | ICD-10-CM | POA: Diagnosis not present

## 2021-08-06 LAB — POCT INR: INR: 2.4 (ref 2.0–3.0)

## 2021-08-06 NOTE — Telephone Encounter (Signed)
Ozempic PA denied since pt does not have DM. Discussed with pt that her insurance unfortunately does not cover semaglutide in either form (Ozempic or [place]). Advised her to let me know if she changes insurance plans in the future and we can revisit coverage. She was appreciative for the call.

## 2021-08-06 NOTE — Patient Instructions (Signed)
Description   BILL [id number]; Continue on same dosage of Warfarin 2 tablets daily except for 1 tablet on Sundays and Thursdays. Recheck INR in 2 weeks. Coumadin Clinic [phone].

## 2021-08-10 ENCOUNTER — Other Ambulatory Visit: Payer: Self-pay

## 2021-08-10 ENCOUNTER — Other Ambulatory Visit: Payer: Medicaid Other | Admitting: *Deleted

## 2021-08-10 DIAGNOSIS — I255 Ischemic cardiomyopathy: Secondary | ICD-10-CM

## 2021-08-10 DIAGNOSIS — I5022 Chronic systolic (congestive) heart failure: Secondary | ICD-10-CM

## 2021-08-10 DIAGNOSIS — Z01812 Encounter for preprocedural laboratory examination: Secondary | ICD-10-CM

## 2021-08-10 DIAGNOSIS — Z79899 Other long term (current) drug therapy: Secondary | ICD-10-CM

## 2021-08-10 LAB — BASIC METABOLIC PANEL
BUN/Creatinine Ratio: 20 (ref 9–23)
BUN: 16 mg/dL (ref 6–20)
CO2: 25 mmol/L (ref 20–29)
Calcium: 9.4 mg/dL (ref 8.7–10.2)
Chloride: 103 mmol/L (ref 96–106)
Creatinine, Ser: 0.82 mg/dL (ref 0.57–1.00)
Glucose: 91 mg/dL (ref 70–99)
Potassium: 4.3 mmol/L (ref 3.5–5.2)
Sodium: 140 mmol/L (ref 134–144)
eGFR: 97 mL/min/{1.73_m2} (ref >59)

## 2021-08-10 LAB — CBC
Hematocrit: 47.2 % — ABNORMAL HIGH (ref 34.0–46.6)
Hemoglobin: 16.4 g/dL — ABNORMAL HIGH (ref 11.1–15.9)
MCH: 32.2 pg (ref 26.6–33.0)
MCHC: 34.7 g/dL (ref 31.5–35.7)
MCV: 93 fL (ref 79–97)
Platelets: 303 10*3/uL (ref 150–450)
RBC: 5.09 x10E6/uL (ref 3.77–5.28)
RDW: 13.5 % (ref 11.7–15.4)
WBC: 4.2 10*3/uL (ref 3.4–10.8)

## 2021-08-10 LAB — PROTIME-INR
INR: 2 — ABNORMAL HIGH (ref 0.9–1.2)
Prothrombin Time: 20.7 s — ABNORMAL HIGH (ref 9.1–12.0)

## 2021-08-19 ENCOUNTER — Telehealth: Payer: Self-pay | Admitting: Internal Medicine

## 2021-08-19 NOTE — Telephone Encounter (Signed)
Patient has no transportation home after defib implant 2/22.    She is aware she will be spending the night and wants to know if she can drive herself home at discharge.

## 2021-08-20 ENCOUNTER — Other Ambulatory Visit: Payer: Self-pay

## 2021-08-20 ENCOUNTER — Ambulatory Visit: Payer: Medicaid Other | Admitting: *Deleted

## 2021-08-20 DIAGNOSIS — Z5181 Encounter for therapeutic drug level monitoring: Secondary | ICD-10-CM

## 2021-08-20 DIAGNOSIS — I236 Thrombosis of atrium, auricular appendage, and ventricle as current complications following acute myocardial infarction: Secondary | ICD-10-CM

## 2021-08-20 LAB — POCT INR: INR: 2.8 (ref 2.0–3.0)

## 2021-08-20 NOTE — Patient Instructions (Addendum)
Description   BILL [id number]; Continue taking Warfarin 2 tablets daily except for 1 tablet on Sundays and Thursdays. Recheck INR in 1 week post porcedure. Coumadin Clinic [phone].

## 2021-08-21 ENCOUNTER — Telehealth: Payer: Self-pay

## 2021-08-21 NOTE — Telephone Encounter (Signed)
°  Ride Scheduled!     Ride ID: [id number]  Transportation Type: RIDESHARE (UBER/LYFT)  Pickup Date/Time: 08/26/21 at 5:30 AM (EST) (Appointment Time: 6:30 AM (EST))  [facility] Address [address] [place], [place] [address], [place]  Drop-off Address [address] [place], [place] [address], [place]

## 2021-08-21 NOTE — Telephone Encounter (Signed)
-----   Message from Alois Cliche, RN sent at 08/21/2021  3:15 PM EST ----- Regarding: transportation Please arrange transportation to and from pt's ICD implant surgery scheduled for 08/26/2021.  She will need to arrive at [facility] at 630am.  If she has no one to stay with her for 24 hours after the procedure she will have to stay overnight and will need transport the following day.  Otherwise, she will go home the same day.  Thank You,  Carlyle Basques

## 2021-08-21 NOTE — Telephone Encounter (Signed)
Spoke with pt and advised of [facility] transportation.  Pt is agreeable to referral for transport to and from the hospital for ICD implant.  Pt advised she will be contacted to set up transport times. Pt also advised per Dr Graciela Husbands she will need to take half of her regular dose of Coumadin on Sunday(2/19) and Monday(2/20) night prior to procedure and her normal dose on Tuesday (2/21)night.  Pt verbalizes understanding and agrees with current plan.

## 2021-08-26 ENCOUNTER — Ambulatory Visit (HOSPITAL_COMMUNITY)
Admission: RE | Disposition: A | Discharge status: Home / Self Care | Payer: Medicaid Other | Source: Home / Self Care | Attending: Internal Medicine

## 2021-08-26 ENCOUNTER — Other Ambulatory Visit: Payer: Self-pay

## 2021-08-26 ENCOUNTER — Encounter (HOSPITAL_COMMUNITY): Payer: Self-pay | Admitting: Internal Medicine

## 2021-08-26 ENCOUNTER — Ambulatory Visit (HOSPITAL_COMMUNITY)
Admission: RE | Admit: 2021-08-26 | Discharge: 2021-08-27 | Disposition: A | Discharge status: Home / Self Care | Payer: Medicaid Other | Attending: Internal Medicine | Admitting: Internal Medicine

## 2021-08-26 DIAGNOSIS — Z20822 Contact with and (suspected) exposure to covid-19: Secondary | ICD-10-CM | POA: Insufficient documentation

## 2021-08-26 DIAGNOSIS — I5022 Chronic systolic (congestive) heart failure: Secondary | ICD-10-CM | POA: Diagnosis not present

## 2021-08-26 DIAGNOSIS — Z959 Presence of cardiac and vascular implant and graft, unspecified: Secondary | ICD-10-CM

## 2021-08-26 DIAGNOSIS — G90A Postural orthostatic tachycardia syndrome (POTS): Secondary | ICD-10-CM | POA: Insufficient documentation

## 2021-08-26 DIAGNOSIS — F319 Bipolar disorder, unspecified: Secondary | ICD-10-CM | POA: Insufficient documentation

## 2021-08-26 DIAGNOSIS — I110 Hypertensive heart disease with heart failure: Secondary | ICD-10-CM | POA: Diagnosis present

## 2021-08-26 DIAGNOSIS — Z7901 Long term (current) use of anticoagulants: Secondary | ICD-10-CM | POA: Diagnosis not present

## 2021-08-26 DIAGNOSIS — I255 Ischemic cardiomyopathy: Secondary | ICD-10-CM | POA: Diagnosis not present

## 2021-08-26 DIAGNOSIS — I252 Old myocardial infarction: Secondary | ICD-10-CM | POA: Insufficient documentation

## 2021-08-26 DIAGNOSIS — I2542 Coronary artery dissection: Secondary | ICD-10-CM | POA: Diagnosis not present

## 2021-08-26 HISTORY — PX: ICD IMPLANT: EP1208

## 2021-08-26 LAB — SARS CORONAVIRUS 2 BY RT PCR (HOSPITAL ORDER, PERFORMED IN ~~LOC~~ HOSPITAL LAB): SARS Coronavirus 2: NEGATIVE

## 2021-08-26 LAB — PROTIME-INR
INR: 1.2 (ref 0.8–1.2)
Prothrombin Time: 14.9 seconds (ref 11.4–15.2)

## 2021-08-26 LAB — PREGNANCY, URINE: Preg Test, Ur: NEGATIVE

## 2021-08-26 SURGERY — ICD IMPLANT

## 2021-08-26 MED ORDER — CEFAZOLIN SODIUM-DEXTROSE 1-4 GM/50ML-% IV SOLN
1.0000 g | Freq: Four times a day (QID) | INTRAVENOUS | Status: AC
  Administered 2021-08-26 – 2021-08-27 (×3): 1 g via INTRAVENOUS
  Filled 2021-08-26 (×3): qty 50

## 2021-08-26 MED ORDER — CEFAZOLIN SODIUM-DEXTROSE 2-4 GM/100ML-% IV SOLN
2.0000 g | INTRAVENOUS | Status: AC
  Administered 2021-08-26: 2 g via INTRAVENOUS

## 2021-08-26 MED ORDER — MIDAZOLAM HCL 5 MG/5ML IJ SOLN
INTRAMUSCULAR | Status: AC
  Filled 2021-08-26: qty 5

## 2021-08-26 MED ORDER — FENTANYL CITRATE (PF) 100 MCG/2ML IJ SOLN
INTRAMUSCULAR | Status: DC | PRN
  Administered 2021-08-26 (×4): 25 ug via INTRAVENOUS

## 2021-08-26 MED ORDER — LIDOCAINE HCL 1 % IJ SOLN
INTRAMUSCULAR | Status: AC
  Filled 2021-08-26: qty 20

## 2021-08-26 MED ORDER — HYDROCODONE-ACETAMINOPHEN 5-325 MG PO TABS
1.0000 | ORAL_TABLET | Freq: Once | ORAL | Status: AC
  Administered 2021-08-26: 1 via ORAL
  Filled 2021-08-26: qty 1

## 2021-08-26 MED ORDER — SODIUM CHLORIDE 0.9 % IV SOLN: INTRAVENOUS | Status: DC

## 2021-08-26 MED ORDER — ACETAMINOPHEN 325 MG PO TABS
325.0000 mg | ORAL_TABLET | ORAL | Status: DC | PRN
  Filled 2021-08-26: qty 2

## 2021-08-26 MED ORDER — MIDAZOLAM HCL 5 MG/5ML IJ SOLN
INTRAMUSCULAR | Status: DC | PRN
  Administered 2021-08-26 (×2): 1 mg via INTRAVENOUS
  Administered 2021-08-26 (×2): 2 mg via INTRAVENOUS

## 2021-08-26 MED ORDER — ALPRAZOLAM 0.25 MG PO TABS
0.2500 mg | ORAL_TABLET | Freq: Three times a day (TID) | ORAL | Status: DC | PRN
  Administered 2021-08-26: 0.25 mg via ORAL
  Filled 2021-08-26: qty 1

## 2021-08-26 MED ORDER — LIDOCAINE HCL (PF) 1 % IJ SOLN
INTRAMUSCULAR | Status: DC | PRN
  Administered 2021-08-26: 45 mL

## 2021-08-26 MED ORDER — FENTANYL CITRATE (PF) 100 MCG/2ML IJ SOLN
INTRAMUSCULAR | Status: AC
  Filled 2021-08-26: qty 2

## 2021-08-26 MED ORDER — SODIUM CHLORIDE 0.9 % IV SOLN
INTRAVENOUS | Status: AC
  Filled 2021-08-26: qty 2

## 2021-08-26 MED ORDER — HEPARIN (PORCINE) IN NACL 1000-0.9 UT/500ML-% IV SOLN
INTRAVENOUS | Status: DC | PRN
  Administered 2021-08-26: 500 mL

## 2021-08-26 MED ORDER — DIAZEPAM 5 MG/ML IJ SOLN
2.5000 mg | Freq: Once | INTRAMUSCULAR | Status: AC
  Administered 2021-08-26: 2.5 mg via INTRAVENOUS
  Filled 2021-08-26: qty 2

## 2021-08-26 MED ORDER — HEPARIN (PORCINE) IN NACL 1000-0.9 UT/500ML-% IV SOLN
INTRAVENOUS | Status: AC
  Filled 2021-08-26: qty 500

## 2021-08-26 MED ORDER — CEFAZOLIN SODIUM-DEXTROSE 2-4 GM/100ML-% IV SOLN
INTRAVENOUS | Status: AC
  Filled 2021-08-26: qty 100

## 2021-08-26 MED ORDER — IOHEXOL 350 MG/ML SOLN
INTRAVENOUS | Status: DC | PRN
  Administered 2021-08-26: 15 mL

## 2021-08-26 MED ORDER — ONDANSETRON HCL 4 MG/2ML IJ SOLN: 4.0000 mg | Freq: Four times a day (QID) | INTRAMUSCULAR | Status: DC | PRN

## 2021-08-26 MED ORDER — SODIUM CHLORIDE 0.9 % IV SOLN
80.0000 mg | INTRAVENOUS | Status: AC
  Administered 2021-08-26: 80 mg

## 2021-08-26 SURGICAL SUPPLY — 8 items
CABLE SURGICAL S-101-97-12 (CABLE) ×2 IMPLANT
HEMOSTAT SURGICEL 2X4 FIBR (HEMOSTASIS) ×1 IMPLANT
ICD MOMENTUM D120 (ICD Generator) ×1 IMPLANT
LEAD RELIANCE 0137-59 (Lead) ×1 IMPLANT
MAT PREVALON FULL STRYKER (MISCELLANEOUS) ×1 IMPLANT
PAD DEFIB RADIO PHYSIO CONN (PAD) ×2 IMPLANT
SHEATH 9.5FR PRELUDE SNAP 13 (SHEATH) ×1 IMPLANT
TRAY PACEMAKER INSERTION (PACKS) ×2 IMPLANT

## 2021-08-26 NOTE — H&P (Signed)
Patient Care Team: Grayce Sessions, NP as PCP - General (Internal Medicine) Laurey Morale, MD as PCP - Advanced Heart Failure (Cardiology)   HPI  Sheila Estes is a 31 y.o. female  who presents for ICD implantation   Found to have acute congestive heart failure about 3 years ago.  She had had an antecedent chest pain event in the context of drinking energy drinks with subsequent shortness of breath treated initially with pneumonia.   Suddenly found to have significant LV dysfunction and underwent catheterization with findings most consistent with coronary artery dissection and a total LAD.  cMRI confirmed full-thickness infarction.  A thrombus was noted Coumadin was started.   LV function remains persistently decreased.    The patient denies chest pain, shortness of breath, nocturnal dyspnea, orthopnea or peripheral edema.  There have been no palpitations, lightheadedness or syncope.         DATE TEST EF    2/20 Echo   25-30 % Mural thrombosis Distal anterior AK  2/20 LHC   % LADp Total Thought 2/2 SCAD  2/20 cMRI 27% LGE cw/ LAD infarct  12/22 Echo  25-30% LAE mod No thrombus       Records and Results Reviewed   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    Past Surgical History:  Procedure Laterality Date   ARTHROSCOPTIC SHOULDER Left 2007   CERVICAL CONIZATION W/BX N/A 12/14/2016   Procedure: COLD KNIFE CONIZATION, REMOVAL OF BODY JEWELRY;  Surgeon:  Bing,  MD;  Location: [facility];  Service: Gynecology;  Laterality: N/A;   RIGHT/LEFT HEART CATH AND CORONARY ANGIOGRAPHY N/A 08/30/2018   Procedure: RIGHT/LEFT HEART CATH AND CORONARY ANGIOGRAPHY;  Surgeon: Laurey Morale, MD;  Location: [facility] INVASIVE CV LAB;  Service: Cardiovascular;  Laterality: N/A;    Current Facility-Administered Medications  Medication Dose Route Frequency Provider Last Rate Last Admin   0.9 %  sodium chloride infusion   Intravenous Continuous Duke Salvia, MD       0.9 %  sodium chloride infusion   Intravenous Continuous Duke Salvia, MD       ceFAZolin (ANCEF) IVPB 2g/100 mL premix  2 g Intravenous On Call Duke Salvia, MD       gentamicin (GARAMYCIN) 80 mg in sodium chloride 0.9 % 500 mL irrigation  80 mg Irrigation On Call Duke Salvia, MD        Allergies  Allergen Reactions   Other Shortness Of Breath    Environmental allergies   Gluten Meal     Gluten makes her wheeze      Social History   Tobacco Use   Smoking status: Never   Smokeless tobacco: Never  Vaping Use   Vaping Use: Never used  Substance Use Topics   Alcohol use: Yes    Comment: socially wine   Drug use:  No     Family History  Problem Relation Age of Onset   Hypertension Mother    Hypertension Father    Diabetes Maternal Grandmother    Hypertension Maternal Grandfather      Current Meds  Medication Sig   albuterol (VENTOLIN HFA) 108 (90 Base) MCG/ACT inhaler Inhale 2 puffs into the lungs every 6 (six) hours as needed for wheezing or shortness of breath. (Patient taking differently: Inhale 2 puffs into the lungs every 6 (six) hours as needed (Asthma).)   atorvastatin (LIPITOR) 80 MG tablet Take 1 tablet (80 mg total) by mouth daily at 6 PM.   carvedilol (COREG) 12.5 MG tablet Take 1.5 tablets (18.75 mg total) by mouth 2 (two) times daily with a meal.   dapagliflozin propanediol (FARXIGA) 10 MG TABS tablet Take 1 tablet (10 mg total) by mouth daily before breakfast.    furosemide (LASIX) 20 MG tablet Take 1 tablet (20 mg total) by mouth daily. (Patient taking differently: Take 20 mg by mouth daily as needed for fluid.)   ipratropium-albuterol (DUONEB) 0.5-2.5 (3) MG/3ML SOLN Take 3 mLs by nebulization every 6 (six) hours as needed.   potassium chloride SA (KLOR-CON) 20 MEQ tablet Take 1 tablet (20 mEq total) by mouth daily. With every LASIX dose (Patient taking differently: Take 20 mEq by mouth daily as needed. With every LASIX dose)   sacubitril-valsartan (ENTRESTO) 97-103 MG Take 1 tablet by mouth 2 (two) times daily.   spironolactone (ALDACTONE) 25 MG tablet Take 1 tablet (25 mg total) by mouth at bedtime.   warfarin (COUMADIN) 5 MG tablet TAKE AS DIRECTED BY ANTICOAGUALTION CLINIC (Patient taking differently: Take 5-10 mg by mouth See admin instructions. Take 5 mg on Sunday and Thursday all the other day take 10 mg in the Morning  TAKE AS DIRECTED BY ANTICOAGUALTION CLINIC)     Review of Systems negative except from HPI and PMH  Physical Exam BP 110/66    Pulse 80    Temp 98.3 F (36.8 C) (Oral)    Resp 16    Ht 5\' 1"  (1.549 m)    Wt 97.5 kg    SpO2 100%    BMI 40.62 kg/m  Well developed and well nourished in no acute distress HENT normal E scleral and icterus clear Neck Supple JVP flat; carotids brisk and full Clear to ausculation Regular rate and rhythm, no murmurs gallops or rub Soft with active bowel sounds No clubbing cyanosis  Edema Alert and oriented, grossly normal motor and sensory function Skin Warm and Dry    Assessment and  Plan  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 I have seen Sheila Estes is a 31 y.o. female in the office today who has been referred by DM for consideration of ICD implant for primary prevention of sudden death.  The patient's chart has been reviewed  and they meet criteria for ICD implant.  I have had a thorough discussion with the patient reviewing options.  The patient and their family (if available) have had opportunities to ask questions and have them answered. The patient and I have decided together through a shared decision making process to implant ICD at this time.  Risks, benefits, alternatives to ICD implantation were discussed in detail with the patient today. The patient  understands that the risks include but are not limited to bleeding, infection,  pneumothorax, perforation, tamponade, vascular damage, renal failure, MI, stroke, death, inappropriate shocks, and lead dislodgement and  wishes to proceed.   Have reviewed the potential benefits and risks of ICD implantation including but not limited to death, perforation of heart or lung, lead dislodgement, infection,  device malfunction and inappropriate shocks.  The patient express understanding  and are willing to proceed.

## 2021-08-27 ENCOUNTER — Ambulatory Visit (HOSPITAL_COMMUNITY): Payer: Medicaid Other

## 2021-08-27 DIAGNOSIS — I255 Ischemic cardiomyopathy: Secondary | ICD-10-CM | POA: Diagnosis not present

## 2021-08-27 DIAGNOSIS — I110 Hypertensive heart disease with heart failure: Secondary | ICD-10-CM | POA: Diagnosis not present

## 2021-08-27 MED ORDER — ACETAMINOPHEN 325 MG PO TABS: 325.0000 mg | ORAL_TABLET | ORAL | Status: DC | PRN

## 2021-08-27 MED FILL — Lidocaine HCl Local Inj 1%: INTRAMUSCULAR | Qty: 60 | Status: AC

## 2021-08-27 NOTE — Discharge Summary (Addendum)
ELECTROPHYSIOLOGY PROCEDURE DISCHARGE SUMMARY    Patient ID: Sheila Estes,  MRN: [id number], DOB/AGE: 33/18/1992 33 y.o.  Admit date: 08/26/2021 Discharge date: 08/27/2021  Primary Care Physician: Grayce Sessions, NP  Primary Cardiologist: None  Electrophysiologist: Dr. Graciela Husbands  Primary Diagnosis:  Chronic systolic CHF   Secondary Diagnosis: LV thrombus on coumadin Anterior wall infarction POTS secondary to spontaneous  coronary artery dissection  Allergies  Allergen Reactions   Other Shortness Of Breath    Environmental allergies   Gluten Meal     Gluten makes her wheeze     Procedures This Admission:  1.  Implantation of a [employer] single chamber ICD on 08/26/21 by Dr. Graciela Husbands.  The patient received a [employer] model number D120 ICD with model number [id number] right ventricular lead. DFT's were deferred at time of implant. J.  There were no immediate post procedure complications. 2.  CXR on 08/27/21 demonstrated no pneumothorax status post device implantation.  Brief HPI: Sheila Estes is a 31 y.o. female was referred to electrophysiology in the outpatient setting  for consideration of ICD implantation.  Past medical history includes above.  The patient has persistent LV dysfunction despite guideline directed therapy.  Risks, benefits, and alternatives to ICD implantation were reviewed with the patient who wished to proceed.   Hospital Course:  The patient was admitted and underwent implantation of a [employer] single chamber ICD with details as outlined above. They were monitored on telemetry overnight which demonstrated NSR.  Left chest was without hematoma or ecchymosis.  The device was interrogated and found to be functioning normally.  CXR was obtained and demonstrated no pneumothorax status post device implantation..  Wound care, arm mobility, and restrictions were reviewed with the patient.  The patient was examined and considered stable for  discharge to home.   The patient's discharge medications include an ACE-I/ARB/ARNI Sherryll Burger) and beta blocker (Coreg).  Anticoagulation resumption This patient is not on anticoagulation.  Physical Exam: Vitals:   08/26/21 1215 08/26/21 1247 08/26/21 2109 08/27/21 0613  BP:  115/72 122/66 118/74  Pulse: 71 69 80 64  Resp: 19 18 19 18   Temp:  98.6 F (37 C) 98.4 F (36.9 C) 98.5 F (36.9 C)  TempSrc:  Oral Oral Oral  SpO2: 97% 96% 98% 98%  Weight:      Height:        GEN- The patient is well appearing, alert and oriented x 3 today.   HEENT: normocephalic, atraumatic; sclera clear, conjunctiva pink; hearing intact; oropharynx clear; neck supple, no JVP Lymph- no cervical lymphadenopathy Lungs- Clear to ausculation bilaterally, normal work of breathing.  No wheezes, rales, rhonchi Heart- Regular rate and rhythm, no murmurs, rubs or gallops, PMI not laterally displaced GI- soft, non-tender, non-distended, bowel sounds present, no hepatosplenomegaly Extremities- no clubbing, cyanosis, or edema; DP/PT/radial pulses 2+ bilaterally MS- no significant deformity or atrophy Skin- warm and dry, no rash or lesion. ICD site stable. Psych- euthymic mood, full affect Neuro- strength and sensation are intact   Labs:   Lab Results  Component Value Date   WBC 4.2 08/10/2021   HGB 16.4 (H) 08/10/2021   HCT 47.2 (H) 08/10/2021   MCV 93 08/10/2021   PLT 303 08/10/2021   No results for input(s): NA, K, CL, CO2, BUN, CREATININE, CALCIUM, PROT, BILITOT, ALKPHOS, ALT, AST, GLUCOSE in the last 168 hours.  Invalid input(s): LABALBU  Discharge Medications:  Allergies as of 08/27/2021  Reactions   Other Shortness Of Breath   Environmental allergies   Gluten Meal    Gluten makes her wheeze        Medication List     TAKE these medications    acetaminophen 325 MG tablet Commonly known as: TYLENOL Take 1-2 tablets (325-650 mg total) by mouth every 4 (four) hours as needed for  mild pain.   albuterol 108 (90 Base) MCG/ACT inhaler Commonly known as: VENTOLIN HFA Inhale 2 puffs into the lungs every 6 (six) hours as needed for wheezing or shortness of breath. What changed: reasons to take this   atorvastatin 80 MG tablet Commonly known as: LIPITOR Take 1 tablet (80 mg total) by mouth daily at 6 PM.   carvedilol 12.5 MG tablet Commonly known as: COREG Take 1.5 tablets (18.75 mg total) by mouth 2 (two) times daily with a meal.   dapagliflozin propanediol 10 MG Tabs tablet Commonly known as: Farxiga Take 1 tablet (10 mg total) by mouth daily before breakfast.   Entresto 97-103 MG Generic drug: sacubitril-valsartan Take 1 tablet by mouth 2 (two) times daily.   Flovent HFA 110 MCG/ACT inhaler Generic drug: fluticasone Inhale 2 puffs into the lungs 2 (two) times daily at 10 AM and 5 PM.   furosemide 20 MG tablet Commonly known as: LASIX Take 1 tablet (20 mg total) by mouth daily. What changed:  when to take this reasons to take this   ipratropium-albuterol 0.5-2.5 (3) MG/3ML Soln Commonly known as: DUONEB Take 3 mLs by nebulization every 6 (six) hours as needed.   potassium chloride SA 20 MEQ tablet Commonly known as: KLOR-CON M Take 1 tablet (20 mEq total) by mouth daily. With every LASIX dose What changed:  when to take this reasons to take this   spironolactone 25 MG tablet Commonly known as: ALDACTONE Take 1 tablet (25 mg total) by mouth at bedtime.   warfarin 5 MG tablet Commonly known as: COUMADIN Take as directed. If you are unsure how to take this medication, talk to your nurse or doctor. Original instructions: TAKE AS DIRECTED BY [facility] What changed:  how much to take how to take this when to take this additional instructions        Disposition:    Follow-up Information     [facility] Follow up.   Why: on 3/8 at 920 for post ICD wound check Contact  information: [address] [place] [address] [phone]                Duration of Discharge Encounter: Greater than 30 minutes including physician time.  Dustin Flock, PA-C  08/27/2021 9:46 AM  Pt seen and examined and instructions given Device ibnterrogated

## 2021-08-27 NOTE — Plan of Care (Signed)
°  Problem: Education: Goal: Knowledge of General Education information will improve Description: Including pain rating scale, medication(s)/side effects and non-pharmacologic comfort measures Outcome: Adequate for Discharge   Problem: Health Behavior/Discharge Planning: Goal: Ability to manage health-related needs will improve Outcome: Adequate for Discharge   Problem: Clinical Measurements: Goal: Ability to maintain clinical measurements within normal limits will improve Outcome: Adequate for Discharge Goal: Will remain free from infection Outcome: Adequate for Discharge Goal: Diagnostic test results will improve Outcome: Adequate for Discharge Goal: Respiratory complications will improve Outcome: Adequate for Discharge Goal: Cardiovascular complication will be avoided Outcome: Adequate for Discharge   Problem: Activity: Goal: Risk for activity intolerance will decrease Outcome: Adequate for Discharge   Problem: Nutrition: Goal: Adequate nutrition will be maintained Outcome: Adequate for Discharge   Problem: Coping: Goal: Level of anxiety will decrease Outcome: Adequate for Discharge   Problem: Pain Managment: Goal: General experience of comfort will improve Outcome: Adequate for Discharge   Problem: Skin Integrity: Goal: Risk for impaired skin integrity will decrease Outcome: Adequate for Discharge   Problem: Safety: Goal: Ability to remain free from injury will improve Outcome: Adequate for Discharge

## 2021-08-27 NOTE — Discharge Instructions (Signed)
After Your ICD (Implantable Cardiac Defibrillator)   You have a [employer] ICD  ACTIVITY Do not lift your arm above shoulder height for 1 week after your procedure. After 7 days, you may progress as below.  You should remove your sling 24 hours after your procedure, unless otherwise instructed by your provider.     Thursday September 03, 2021  Friday September 04, 2021 Saturday September 05, 2021 Sunday September 06, 2021   Do not lift, push, pull, or carry anything over 10 pounds with the affected arm until 6 weeks (Thursday October 08, 2021 ) after your procedure.   You may drive AFTER your wound check, unless you have been told otherwise by your provider.   Ask your healthcare provider when you can go back to work   INCISION/Dressing If you are on a blood thinner such as Coumadin, Xarelto, Eliquis, Plavix, or Pradaxa please confirm with your provider when this should be resumed.   If large square, outer bandage is left in place, this can be removed after 24 hours from your procedure. Do not remove steri-strips or glue as below.   Monitor your defibrillator site for redness, swelling, and drainage. Call the device clinic at [phone] if you experience these symptoms or fever/chills.  If your incision is closed with Dermabond/Surgical glue. You may shower 1 day after your pacemaker implant and wash around the site with soap and water.    If you were discharged in a sling, please do not wear this during the day more than 48 hours after your surgery unless otherwise instructed. This may increase the risk of stiffness and soreness in your shoulder.   Avoid lotions, ointments, or perfumes over your incision until it is well-healed.  You may use a hot tub or a pool AFTER your wound check appointment if the incision is completely closed.  Your ICD is designed to protect you from life threatening heart rhythms. Because of this, you may receive a shock.   1 shock with no symptoms:  Call the office during  business hours. 1 shock with symptoms (chest pain, chest pressure, dizziness, lightheadedness, shortness of breath, overall feeling unwell):  Call 911. If you experience 2 or more shocks in 24 hours:  Call 911. If you receive a shock, you should not drive for 6 months per the  DMV IF you receive appropriate therapy from your ICD.   ICD Alerts:  Some alerts are vibratory and others beep. These are NOT emergencies. Please call our office to let us know. If this occurs at night or on weekends, it can wait until the next business day. Send a remote transmission.  If your device is capable of reading fluid status (for heart failure), you will be offered monthly monitoring to review this with you.   DEVICE MANAGEMENT Remote monitoring is used to monitor your ICD from home. This monitoring is scheduled every 91 days by our office. It allows [place] to keep an eye on the functioning of your device to ensure it is working properly. You will routinely see your Electrophysiologist annually (more often if necessary).   You should receive your ID card for your new device in 4-8 weeks. Keep this card with you at all times once received. Consider wearing a medical alert bracelet or necklace.  Your ICD  may be MRI compatible. This will be discussed at your next office visit/wound check.  You should avoid contact with strong electric or magnetic fields.   Do not use amateur (  ham) radio equipment or electric (arc) welding torches. MP3 player headphones with magnets should not be used. Some devices are safe to use if held at least 12 inches (30 cm) from your defibrillator. These include power tools, lawn mowers, and speakers. If you are unsure if something is safe to use, ask your health care provider.  When using your cell phone, hold it to the ear that is on the opposite side from the defibrillator. Do not leave your cell phone in a pocket over the defibrillator.  You may safely use electric blankets, heating pads,  computers, and microwave ovens.  Call the office right away if: You have chest pain. You feel more than one shock. You feel more short of breath than you have felt before. You feel more light-headed than you have felt before. Your incision starts to open up.  This information is not intended to replace advice given to you by your health care provider. Make sure you discuss any questions you have with your health care provider.

## 2021-08-27 NOTE — Progress Notes (Signed)
Discharge instruction given to patient.  Explained her of post surgical precautions. Pt verbalized understanding.  Idolina Primer, RN

## 2021-08-28 ENCOUNTER — Other Ambulatory Visit: Payer: Self-pay | Admitting: Cardiology

## 2021-09-04 ENCOUNTER — Other Ambulatory Visit: Payer: Self-pay

## 2021-09-04 ENCOUNTER — Ambulatory Visit: Payer: Medicaid Other | Admitting: *Deleted

## 2021-09-04 DIAGNOSIS — Z5181 Encounter for therapeutic drug level monitoring: Secondary | ICD-10-CM

## 2021-09-04 LAB — POCT INR: INR: 1.4 — AB (ref 2.0–3.0)

## 2021-09-04 NOTE — Patient Instructions (Signed)
Description   ?BILL 29021 ?-Today take 3 tablets of warfarin ?-Tomorrow take 2.5 tablets of warfarin ?-Then continue taking Warfarin 2 tablets daily except for 1 tablet on Sundays and Thursdays. Recheck INR in 1 week. [facility] [phone].  ?  ? ? ?

## 2021-09-09 ENCOUNTER — Ambulatory Visit (INDEPENDENT_AMBULATORY_CARE_PROVIDER_SITE_OTHER): Payer: Medicaid Other | Admitting: *Deleted

## 2021-09-09 ENCOUNTER — Telehealth (HOSPITAL_COMMUNITY): Payer: Self-pay

## 2021-09-09 ENCOUNTER — Other Ambulatory Visit: Payer: Self-pay

## 2021-09-09 ENCOUNTER — Ambulatory Visit (INDEPENDENT_AMBULATORY_CARE_PROVIDER_SITE_OTHER): Payer: Medicaid Other

## 2021-09-09 DIAGNOSIS — I255 Ischemic cardiomyopathy: Secondary | ICD-10-CM | POA: Diagnosis not present

## 2021-09-09 DIAGNOSIS — Z5181 Encounter for therapeutic drug level monitoring: Secondary | ICD-10-CM

## 2021-09-09 DIAGNOSIS — I236 Thrombosis of atrium, auricular appendage, and ventricle as current complications following acute myocardial infarction: Secondary | ICD-10-CM | POA: Diagnosis not present

## 2021-09-09 LAB — CUP PACEART INCLINIC DEVICE CHECK
Brady Statistic RV Percent Paced: 0.1 % — CL
Date Time Interrogation Session: 20230308183343
HighPow Impedance: 53 Ohm
Implantable Lead Implant Date: 20230222
Implantable Lead Location: 753860
Implantable Lead Model: 137
Implantable Lead Serial Number: 301096
Implantable Pulse Generator Implant Date: 20230222
Lead Channel Impedance Value: 475 Ohm
Lead Channel Pacing Threshold Amplitude: 0.9 V
Lead Channel Pacing Threshold Pulse Width: 0.4 ms
Lead Channel Sensing Intrinsic Amplitude: 14.8 mV
Lead Channel Setting Pacing Amplitude: 3.5 V
Lead Channel Setting Pacing Pulse Width: 0.4 ms
Lead Channel Setting Sensing Sensitivity: 0.5 mV
Pulse Gen Serial Number: 215988

## 2021-09-09 LAB — POCT INR: INR: 1.9 — AB (ref 2.0–3.0)

## 2021-09-09 NOTE — Patient Instructions (Signed)
? ?  After Your ICD ?(Implantable Cardiac Defibrillator) ? ? ? ?Monitor your defibrillator site for redness, swelling, and drainage. Call the device clinic at [phone] if you experience these symptoms or fever/chills. ? ?Your incision was closed with Dermabond:  You may shower and wash your incision with soap and water. Avoid lotions, ointments, or perfumes over your incision until it is well-healed. ? ?You may use a hot tub or a pool after your wound check appointment if the incision is completely closed. ? ?Do not lift, push or pull greater than 10 pounds with the affected arm until 6 weeks after your procedure. There are no other restrictions in arm movement after your wound check appointment. April 6 ? ?Your ICD is MRI compatible. ? ?Your ICD is designed to protect you from life threatening heart rhythms. Because of this, you may receive a shock.  ? ?1 shock with no symptoms:  Call the office during business hours. ?1 shock with symptoms (chest pain, chest pressure, dizziness, lightheadedness, shortness of breath, overall feeling unwell):  Call 911. ?If you experience 2 or more shocks in 24 hours:  Call 911. ?If you receive a shock, you should not drive.  ?[place] DMV - no driving for 6 months if you receive appropriate therapy from your ICD.  ? ?ICD Alerts:  Some alerts are vibratory and others beep. These are NOT emergencies. Please call our office to let us know. If this occurs at night or on weekends, it can wait until the next business day. Send a remote transmission. ? ?If your device is capable of reading fluid status (for heart failure), you will be offered monthly monitoring to review this with you.  ? ?Remote monitoring is used to monitor your ICD from home. This monitoring is scheduled every 91 days by our office. It allows [place] to keep an eye on the functioning of your device to ensure it is working properly. You will routinely see your Electrophysiologist annually (more often if necessary).  ?

## 2021-09-09 NOTE — Patient Instructions (Addendum)
Description   ?BILL 43329 ?Today take 3 tablets of warfarin then continue taking Warfarin 2 tablets daily except for 1 tablet on Sundays and Thursdays. Recheck INR in 12 days. [facility] [phone].  ?  ?  ? ?

## 2021-09-09 NOTE — Telephone Encounter (Signed)
Called to confirm/remind patient of their appointment at [facility] on 09/10/21.  ? ?Patient reminded to bring all medications and/or complete list. ? ?Confirmed patient has transportation. Gave directions, instructed to utilize valet parking. ? ?Confirmed appointment prior to ending call.  ? ?

## 2021-09-09 NOTE — Progress Notes (Signed)
Wound check appointment. Steri-strips removed. Wound without redness or edema. Incision edges approximated, wound well healed. Normal device function. Thresholds, sensing, and impedances consistent with implant measurements. Device programmed at 3.5V for extra safety margin until 3 month visit. Histogram distribution appropriate for patient and level of activity. No ventricular arrhythmias noted. Patient educated about wound care, arm mobility, lifting restrictions, shock plan. Patient enrolled in remote monitoring with next transmission scheduled 11/24/21. 91 day follow up with Dr. Graciela Husbands 11/26/21. ?

## 2021-09-10 ENCOUNTER — Encounter (HOSPITAL_COMMUNITY): Payer: Medicaid Other

## 2021-09-21 ENCOUNTER — Other Ambulatory Visit: Payer: Self-pay

## 2021-09-21 ENCOUNTER — Ambulatory Visit (INDEPENDENT_AMBULATORY_CARE_PROVIDER_SITE_OTHER): Payer: Medicaid Other | Admitting: *Deleted

## 2021-09-21 DIAGNOSIS — I236 Thrombosis of atrium, auricular appendage, and ventricle as current complications following acute myocardial infarction: Secondary | ICD-10-CM | POA: Diagnosis not present

## 2021-09-21 DIAGNOSIS — Z5181 Encounter for therapeutic drug level monitoring: Secondary | ICD-10-CM

## 2021-09-21 LAB — POCT INR: INR: 1.4 — AB (ref 2.0–3.0)

## 2021-09-21 NOTE — Patient Instructions (Signed)
Description   ?BILL 50037 ?Today take 1.5 tablets (already taken 1 tablet today) of warfarin and take 2.5 tablets tomorrow then continue taking Warfarin 2 tablets daily except for 1 tablet on Sundays and Thursdays. Recheck INR in 1 week. [facility] [phone].  ?  ?  ?

## 2021-09-29 ENCOUNTER — Encounter (HOSPITAL_COMMUNITY): Payer: Medicaid Other

## 2021-09-30 ENCOUNTER — Ambulatory Visit (INDEPENDENT_AMBULATORY_CARE_PROVIDER_SITE_OTHER): Payer: Medicaid Other | Admitting: *Deleted

## 2021-09-30 DIAGNOSIS — I236 Thrombosis of atrium, auricular appendage, and ventricle as current complications following acute myocardial infarction: Secondary | ICD-10-CM | POA: Diagnosis not present

## 2021-09-30 DIAGNOSIS — Z5181 Encounter for therapeutic drug level monitoring: Secondary | ICD-10-CM

## 2021-09-30 LAB — POCT INR: INR: 1.8 — AB (ref 2.0–3.0)

## 2021-09-30 NOTE — Patient Instructions (Addendum)
Description   ?BILL 01093 ?Today take 2.5 tablets then start taking Warfarin 2 tablets daily except for 1 tablet on Sundays. Recheck INR in 2 weeks. [facility] [phone].  ?  ?  ? ?

## 2021-10-08 ENCOUNTER — Telehealth (HOSPITAL_COMMUNITY): Payer: Self-pay

## 2021-10-08 NOTE — Telephone Encounter (Signed)
Called to confirm/remind patient of their appointment at the [facility] on 09/08/21.  ? ?Patient reminded to bring all medications and/or complete list. ? ?Confirmed patient has transportation. Gave directions, instructed to utilize valet parking. ? ?Confirmed appointment prior to ending call.  ? ?

## 2021-10-09 ENCOUNTER — Ambulatory Visit (HOSPITAL_COMMUNITY)
Admission: RE | Admit: 2021-10-09 | Discharge: 2021-10-09 | Disposition: A | Discharge status: Home / Self Care | Payer: Medicaid Other | Source: Ambulatory Visit | Attending: Family Medicine | Admitting: Family Medicine

## 2021-10-09 ENCOUNTER — Encounter (HOSPITAL_COMMUNITY): Payer: Self-pay

## 2021-10-09 VITALS — BP 100/60 | HR 65 | Wt 218.4 lb

## 2021-10-09 DIAGNOSIS — Z6841 Body Mass Index (BMI) 40.0 and over, adult: Secondary | ICD-10-CM | POA: Insufficient documentation

## 2021-10-09 DIAGNOSIS — F419 Anxiety disorder, unspecified: Secondary | ICD-10-CM | POA: Insufficient documentation

## 2021-10-09 DIAGNOSIS — Z7984 Long term (current) use of oral hypoglycemic drugs: Secondary | ICD-10-CM | POA: Diagnosis not present

## 2021-10-09 DIAGNOSIS — I5022 Chronic systolic (congestive) heart failure: Secondary | ICD-10-CM | POA: Insufficient documentation

## 2021-10-09 DIAGNOSIS — I255 Ischemic cardiomyopathy: Secondary | ICD-10-CM | POA: Diagnosis not present

## 2021-10-09 DIAGNOSIS — J45909 Unspecified asthma, uncomplicated: Secondary | ICD-10-CM | POA: Insufficient documentation

## 2021-10-09 DIAGNOSIS — E669 Obesity, unspecified: Secondary | ICD-10-CM | POA: Insufficient documentation

## 2021-10-09 DIAGNOSIS — F319 Bipolar disorder, unspecified: Secondary | ICD-10-CM | POA: Diagnosis not present

## 2021-10-09 DIAGNOSIS — I2510 Atherosclerotic heart disease of native coronary artery without angina pectoris: Secondary | ICD-10-CM | POA: Diagnosis not present

## 2021-10-09 DIAGNOSIS — Z79899 Other long term (current) drug therapy: Secondary | ICD-10-CM | POA: Diagnosis not present

## 2021-10-09 DIAGNOSIS — R6881 Early satiety: Secondary | ICD-10-CM

## 2021-10-09 DIAGNOSIS — Z95810 Presence of automatic (implantable) cardiac defibrillator: Secondary | ICD-10-CM | POA: Diagnosis not present

## 2021-10-09 DIAGNOSIS — I236 Thrombosis of atrium, auricular appendage, and ventricle as current complications following acute myocardial infarction: Secondary | ICD-10-CM | POA: Diagnosis not present

## 2021-10-09 DIAGNOSIS — Z7901 Long term (current) use of anticoagulants: Secondary | ICD-10-CM | POA: Diagnosis not present

## 2021-10-09 LAB — BASIC METABOLIC PANEL
Anion gap: 6 (ref 5–15)
BUN: 8 mg/dL (ref 6–20)
CO2: 28 mmol/L (ref 22–32)
Calcium: 9.3 mg/dL (ref 8.9–10.3)
Chloride: 106 mmol/L (ref 98–111)
Creatinine, Ser: 0.72 mg/dL (ref 0.44–1.00)
GFR, Estimated: >60 mL/min (ref >60)
Glucose, Bld: 98 mg/dL (ref 70–99)
Potassium: 4.1 mmol/L (ref 3.5–5.1)
Sodium: 140 mmol/L (ref 135–145)

## 2021-10-09 LAB — LIPID PANEL
Cholesterol: 144 mg/dL (ref 0–200)
HDL: 55 mg/dL (ref >40)
LDL Cholesterol: 74 mg/dL (ref 0–99)
Total CHOL/HDL Ratio: 2.6 RATIO
Triglycerides: 77 mg/dL (ref <150)
VLDL: 15 mg/dL (ref 0–40)

## 2021-10-09 LAB — BRAIN NATRIURETIC PEPTIDE: B Natriuretic Peptide: 440.7 pg/mL — ABNORMAL HIGH (ref 0.0–100.0)

## 2021-10-09 MED ORDER — FUROSEMIDE 20 MG PO TABS: 20.0000 mg | ORAL_TABLET | Freq: Every day | ORAL | 5 refills | Status: DC

## 2021-10-09 MED ORDER — POTASSIUM CHLORIDE CRYS ER 20 MEQ PO TBCR: 20.0000 meq | EXTENDED_RELEASE_TABLET | Freq: Every day | ORAL | 3 refills | Status: DC

## 2021-10-09 NOTE — Progress Notes (Signed)
?  ? ? ?Date:  10/09/2021  ? ?ID:  Sheila Estes, DOB April 15, 1991, MRN [id number]   ?Provider location: [place] ?Type of Visit: Established patient  ? ?PCP:  Sheila Sessions, NP  ?HF Cardiologist:  Dr. Shirlee Estes ? ?Chief Complaint: F/u for Chronic Systolic Heart Failure  ?  ?History of Present Illness: ?Sheila Estes is a 31 y.o. with a history of history of obesity, anxiety, asthma, bipolar disorder, depression, and CAD, chronic systolic heart failure. ? ?Admitted 2/23-09/04/18 with pulmonary edema. She had been short of breath for about a month, which started after drinking 2 Red Bulls. She had been seen at Urgent Care and [facility] and was treated for asthma and for PNA. Echo at 2/20 admission showed newly reduced EF 25-30% with peri-apical akinesis and apical thrombus. Transferred to [facility] and had LHC/RHC, which showed normal filling pressures and preserved cardiac output.  The proximal LAD was subtotally occluded, it looked like a SCAD event. Cardiac MRI suggested that LAD territory was infarcted and unlikely to be viable with restoration of perfusion. HF medications were optimized and she was set up with LifeVest for discharge. She was started on coumadin for LV thrombus. DC weight: 198 lbs. ? ?Seen in the [facility] 02/2019. At that time she had self discontinued her Lifevest and had returned it. Echo was repeated at visit and EF remained severely reduced at 20-25% and RV was moderately reduced.  ? ?On coumadin for LV thrombus. INRs followed by [facility] coumadin clinic.  ? ?Echo 12/22 EF 25-30%, peri-apical akinesis, no LV thrombus, mildly decreased RV systolic function, normal IVC.  ? ?S/p ICD 2/23. ? ?Today she returns for HF follow up. Overall feeling fine. Experiencing early satiety and feels she has abdominal fullness. She is taking lasix every other day. She does not have significant dyspnea with exertion. Denies abnormal bleeding, CP, dizziness, edema, or PND/Orthopnea. Appetite ok. No  fever or chills. Weight at home 212 pounds. Taking all medications. 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 Has experienced palpitation-like sensation in middle of sternum since ICD implant, no overt pain. ? ?ICD interrogation (personally reviewed): HL Score 5, average HR 71, 2.2 hrs daily activity. ? ?ReDs: 46% ? ?ECG (personally reviewed): NSR 68 bpm, lateral T wave flattening ? ?Labs (3/20): K 3.9, creatinine 0.79 ?Labs (8/21): K 3.6 creatinine 0.71 ?Labs (10/21): K 4, creatinine 0.62, LDL 57 ?Labs (8/22): K 3.8, creatinine 0.7 ?Labs (2/23): K 4.3, creatinine 0.82 ? ?PMH: ?1. Depression ?2. Anxiety ?3. Asthma ?4. Chronic systolic CHF: Ischemic cardiomyopathy.   ?- Echo (2/20) with EF 25-30%, peri-apical akinesis, LV thrombus.  ?- LHC/RHC (2/20): Subtotal LAD occlusion after S1.  Appearance of LAD suggests SCAD.  Mean RA 1, PA 36/8 mean 23, mean PCWP 12, CI 2.46.  ?- Cardiac MRI (2/20): Mildly dilated LV with EF 27%, delayed enhancement images suggest a dense LAD-territory infarct (myocardium unlikely to be viable), LV apical thrombus, RV EF 31%.  ?- Echo (12/22): EF 25-30%, peri-apical akinesis, no LV thrombus, mildly decreased RV systolic function, normal IVC.  ?- s/p [employer] ICD 2/23. ?5. CAD: LHC (2/20) with subtotal LAD occlusion after S1.  Appearance of LAD suggests SCAD. ? ?Current Outpatient Medications  ?Medication Sig Dispense Refill  ? acetaminophen (TYLENOL) 325 MG tablet Take 1-2 tablets (325-650 mg total) by mouth every 4 (four) hours as needed for mild pain.    ? albuterol (VENTOLIN HFA) 108 (90 Base) MCG/ACT inhaler Inhale  2 puffs into the lungs every 6 (six) hours as needed for wheezing or shortness of breath. 1 each 2  ? atorvastatin (LIPITOR) 80 MG tablet Take 1 tablet (80 mg total) by mouth daily at 6 PM. 90 tablet 3  ? carvedilol (COREG) 12.5 MG tablet Take 1.5 tablets (18.75 mg total) by mouth 2 (two) times daily with a meal. 270 tablet 3  ? dapagliflozin propanediol  (FARXIGA) 10 MG TABS tablet Take 1 tablet (10 mg total) by mouth daily before breakfast. 90 tablet 3  ? fluticasone (FLOVENT HFA) 110 MCG/ACT inhaler Inhale 2 puffs into the lungs 2 (two) times daily at 10 AM and 5 PM. 1 each 12  ? furosemide (LASIX) 20 MG tablet Take 20 mg by mouth as needed.    ? ipratropium-albuterol (DUONEB) 0.5-2.5 (3) MG/3ML SOLN Take 3 mLs by nebulization every 6 (six) hours as needed. 360 mL 0  ? potassium chloride SA (KLOR-CON M) 20 MEQ tablet Take 20 mEq by mouth as needed.    ? sacubitril-valsartan (ENTRESTO) 97-103 MG Take 1 tablet by mouth 2 (two) times daily. 60 tablet 11  ? spironolactone (ALDACTONE) 25 MG tablet Take 1 tablet (25 mg total) by mouth at bedtime. 90 tablet 3  ? warfarin (COUMADIN) 5 MG tablet TAKE AS DIRECTED BY ANTICOAGULATION CLINIC 55 tablet 0  ? ?No current facility-administered medications for this encounter.  ? ? ?Allergies:   Other and Gluten meal  ? ?Social History:  The patient  reports that she has never smoked. She has never used smokeless tobacco. She reports current alcohol use. She reports that she does not use drugs.  ? ?Family History:  The patient's family history includes Diabetes in her maternal grandmother; Hypertension in her father, maternal grandfather, and mother.  ? ?ROS:  Please see the history of present illness.   All other systems are personally reviewed and negative.  ? ?Recent Labs: ?06/12/2021: B Natriuretic Peptide 415.6 ?08/10/2021: BUN 16; Creatinine, Ser 0.82; Hemoglobin 16.4; Platelets 303; Potassium 4.3; Sodium 140  ?  ?Wt Readings from Last 3 Encounters:  ?10/09/21 99.1 kg (218 lb 6.4 oz)  ?08/26/21 97.5 kg (215 lb)  ?07/13/21 98.3 kg (216 lb 12.8 oz)  ?  ?BP 100/60   Pulse 65   Wt 99.1 kg (218 lb 6.4 oz)   SpO2 97%   BMI 41.27 kg/m?  ? ?PHYSICAL EXAM: ?General:  NAD. No resp difficulty ?HEENT: Normal ?Neck: Supple. Thick neck. Carotids 2+ bilat; no bruits. No lymphadenopathy or thryomegaly appreciated. ?Cor: PMI nondisplaced.  Regular rate & rhythm. No rubs, gallops or murmurs. ?Lungs: Clear ?Abdomen: Obese, nontender, nondistended. No hepatosplenomegaly. No bruits or masses. Good bowel sounds. ?Extremities: No cyanosis, clubbing, rash, edema ?Neuro: Alert & oriented x 3, cranial nerves grossly intact. Moves all 4 extremities w/o difficulty. Affect pleasant. ? ?ASSESSMENT AND PLAN: ?1. 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  RHC in 2/20 showed normal filling pressures and preserved cardiac output.  Cardiac MRI in 2/20 suggested that LAD territory was infarcted and unlikely to be viable with restoration of perfusion. Repeat Echo 02/2019, EF 20-25% RV was moderately reduced.  Echo 12/22 EF still 25-30% with peri-apical akinesis.  S/p [employer] ICD implant 2/23. NYHA class II symptoms.  She is not volume overloaded on exam, however ReDs 46% (? Accuracy due to body habitus). ?- She can increase Lasix to 20 mg daily +  20 KCL daily. BMET/BNP today.  ?- Continue Entresto 97/103 bid. BMET today.  ?- Continue Farxiga 10 mg daily.  ?- Continue spironolactone 25 mg daily.  ?- Continue Coreg 18.75 mg bid.  ?- consider future addition of Bidil if BP can tolerate. ?- She needs to avoid pregnancy given teratogenic potentional of cardiac meds. She understands she need to contact HF clinic if she is gets pregnant so we adjust HF medications.  ?- As above, LAD territory is unlikely to be viable based on MRI, so would not pursue CTO procedure or CABG.  ?2. H/o LV thrombus: On warfarin.  No overt bleeding.  ?3. 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 No chest pain.  ?- On warfarin so no ASA.  ?- On high dose statin, check lipids today. ?- LAD territory is unlikely to be viable based on MRI, so would not pursue CTO procedure or CABG. ?4. Obesity: Body mass index is 41.27 kg/m?Marland Kitchen Working on weight loss.  ?-  Insurance does not cover Ozempic or [id number]. ?5. Early satiety: She is having regular BMs, could be related to fluid. She does endorse gluten sensitivity. Advised follow up with PCP for further work up. ? ?Follow up in 3-4

## 2021-10-09 NOTE — Patient Instructions (Addendum)
Thank you for coming in today ? ?Labs were done today, if any labs are abnormal the clinic will call you ?No news is good news ? ?CHANGE Lasix 20 mg 1 tablet daily ?START Potassium 20 meq 1 tablet daily  ? ?Your physician recommends that you schedule a follow-up appointment in:  ?4 months with Dr. Aundra Dubin please call in May 2023 for appointment  ? ?At the [facility], you and your health needs are our priority. As part of our continuing mission to provide you with exceptional heart care, we have created designated Provider Care Teams. These Care Teams include your primary Cardiologist (physician) and Advanced Practice Providers (APPs- Physician Assistants and Nurse Practitioners) who all work together to provide you with the care you need, when you need it.  ? ?You may see any of the following providers on your designated Care Team at your next follow up: ?Dr Glori Bickers ?Dr Loralie Champagne ?Darrick Grinder, NP ?Lyda Jester, PA ?Jessica Milford,NP ?Marlyce Huge, PA ?Audry Riles, PharmD ? ? ?Please be sure to bring in all your medications bottles to every appointment.  ? ?If you have any questions or concerns before your next appointment please send [place] a message through [facility] or call our office at [phone].   ? ?TO LEAVE A MESSAGE FOR THE NURSE SELECT OPTION 2, PLEASE LEAVE A MESSAGE INCLUDING: ?YOUR NAME ?DATE OF BIRTH ?CALL BACK NUMBER ?REASON FOR CALL**this is important as we prioritize the call backs ? ?YOU WILL RECEIVE A CALL BACK THE SAME DAY AS LONG AS YOU CALL BEFORE 4:00 PM ?  ?

## 2021-10-09 NOTE — Progress Notes (Signed)
ReDS Vest / Clip - 10/09/21 1400   ? ?  ? ReDS Vest / Clip  ? Station Marker A   ? Ruler Value 31   ? ReDS Value Range High volume overload   ? ReDS Actual Value 46   ? ?  ?  ? ?  ? ? ?

## 2021-10-15 ENCOUNTER — Ambulatory Visit (INDEPENDENT_AMBULATORY_CARE_PROVIDER_SITE_OTHER): Payer: Medicaid Other | Admitting: *Deleted

## 2021-10-15 DIAGNOSIS — Z5181 Encounter for therapeutic drug level monitoring: Secondary | ICD-10-CM

## 2021-10-15 LAB — POCT INR: INR: 3.3 — AB (ref 2.0–3.0)

## 2021-10-15 NOTE — Patient Instructions (Signed)
Description   ?BILL [id number] ?Hold warfarin today then taking Warfarin 2 tablets daily except for 1 tablet on Sundays. Recheck INR in 2 weeks. Coumadin Clinic [phone].  ?  ? ? ?

## 2021-11-05 ENCOUNTER — Ambulatory Visit (INDEPENDENT_AMBULATORY_CARE_PROVIDER_SITE_OTHER): Payer: Medicaid Other

## 2021-11-05 DIAGNOSIS — I236 Thrombosis of atrium, auricular appendage, and ventricle as current complications following acute myocardial infarction: Secondary | ICD-10-CM

## 2021-11-05 DIAGNOSIS — Z5181 Encounter for therapeutic drug level monitoring: Secondary | ICD-10-CM | POA: Diagnosis not present

## 2021-11-05 LAB — POCT INR: INR: 3.1 — AB (ref 2.0–3.0)

## 2021-11-05 NOTE — Patient Instructions (Addendum)
Description   ?BILL [id number] ?Eat greens today and continue taking what you have been taking, Warfarin 2 tablets daily except for 1 tablet on Sundays and Thursdays.  ?Recheck INR in 3 weeks.  ?Coumadin Clinic [phone].  ?  ?   ?

## 2021-11-17 DIAGNOSIS — Z95810 Presence of automatic (implantable) cardiac defibrillator: Secondary | ICD-10-CM | POA: Insufficient documentation

## 2021-11-24 ENCOUNTER — Ambulatory Visit (INDEPENDENT_AMBULATORY_CARE_PROVIDER_SITE_OTHER): Payer: Medicaid Other

## 2021-11-24 DIAGNOSIS — I255 Ischemic cardiomyopathy: Secondary | ICD-10-CM | POA: Diagnosis not present

## 2021-11-24 LAB — CUP PACEART REMOTE DEVICE CHECK
Battery Remaining Longevity: 180 mo
Battery Remaining Percentage: 100 %
Brady Statistic RV Percent Paced: 0 %
Date Time Interrogation Session: 20230523043600
HighPow Impedance: 63 Ohm
Implantable Lead Implant Date: 20230222
Implantable Lead Location: 753860
Implantable Lead Model: 137
Implantable Lead Serial Number: 301096
Implantable Pulse Generator Implant Date: 20230222
Lead Channel Impedance Value: 485 Ohm
Lead Channel Pacing Threshold Amplitude: 0.8 V
Lead Channel Pacing Threshold Pulse Width: 0.4 ms
Lead Channel Setting Pacing Amplitude: 3.5 V
Lead Channel Setting Pacing Pulse Width: 0.4 ms
Lead Channel Setting Sensing Sensitivity: 0.5 mV
Pulse Gen Serial Number: 215988

## 2021-11-26 ENCOUNTER — Encounter: Payer: Self-pay | Admitting: Internal Medicine

## 2021-11-26 ENCOUNTER — Ambulatory Visit (INDEPENDENT_AMBULATORY_CARE_PROVIDER_SITE_OTHER): Payer: Medicaid Other | Admitting: Internal Medicine

## 2021-11-26 ENCOUNTER — Ambulatory Visit (INDEPENDENT_AMBULATORY_CARE_PROVIDER_SITE_OTHER): Payer: Medicaid Other | Admitting: *Deleted

## 2021-11-26 VITALS — BP 114/80 | HR 73 | Ht 61.0 in | Wt 222.0 lb

## 2021-11-26 DIAGNOSIS — I5022 Chronic systolic (congestive) heart failure: Secondary | ICD-10-CM | POA: Diagnosis not present

## 2021-11-26 DIAGNOSIS — Z95810 Presence of automatic (implantable) cardiac defibrillator: Secondary | ICD-10-CM

## 2021-11-26 DIAGNOSIS — Z5181 Encounter for therapeutic drug level monitoring: Secondary | ICD-10-CM | POA: Diagnosis not present

## 2021-11-26 DIAGNOSIS — D582 Other hemoglobinopathies: Secondary | ICD-10-CM | POA: Diagnosis not present

## 2021-11-26 DIAGNOSIS — I255 Ischemic cardiomyopathy: Secondary | ICD-10-CM | POA: Diagnosis not present

## 2021-11-26 DIAGNOSIS — I236 Thrombosis of atrium, auricular appendage, and ventricle as current complications following acute myocardial infarction: Secondary | ICD-10-CM

## 2021-11-26 LAB — POCT INR: INR: 1.6 — AB (ref 2.0–3.0)

## 2021-11-26 NOTE — Patient Instructions (Signed)
Medication Instructions:  Your physician recommends that you continue on your current medications as directed. Please refer to the Current Medication list given to you today.  *If you need a refill on your cardiac medications before your next appointment, please call your pharmacy*   Lab Work: CBC today If you have labs (blood work) drawn today and your tests are completely normal, you will receive your results only by: MyChart Message (if you have MyChart) OR A paper copy in the mail If you have any lab test that is abnormal or we need to change your treatment, we will call you to review the results.   Testing/Procedures: None ordered.    Follow-Up: At [facility], you and your health needs are our priority.  As part of our continuing mission to provide you with exceptional heart care, we have created designated Provider Care Teams.  These Care Teams include your primary Cardiologist (physician) and Advanced Practice Providers (APPs -  Physician Assistants and Nurse Practitioners) who all work together to provide you with the care you need, when you need it.  We recommend signing up for the patient portal called "MyChart".  Sign up information is provided on this After Visit Summary.  MyChart is used to connect with patients for Virtual Visits (Telemedicine).  Patients are able to view lab/test results, encounter notes, upcoming appointments, etc.  Non-urgent messages can be sent to your provider as well.   To learn more about what you can do with MyChart, go to [email or web address].    Your next appointment:   9 months with Dr Graciela Husbands  Important Information About Sugar

## 2021-11-26 NOTE — Progress Notes (Signed)
Patient Care Team: Grayce Sessions, NP as PCP - General (Internal Medicine) Laurey Morale, MD as PCP - Advanced Heart Failure (Cardiology)   HPI  Sheila Estes is a 31 y.o. 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  Further complicated by left ventricular thrombus resolved on most recent echo  The patient denies chest pain, shortness of breath, nocturnal dyspnea, orthopnea or peripheral edema.  There have been no palpitations, lightheadedness or syncope.  Complains of some keloid at her device as well and is that her heart thumbs periodically and 22 minutes after the hour.   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 Records and Results Reviewed   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    Past Surgical History:  Procedure Laterality Date   ARTHROSCOPTIC SHOULDER Left 2007   CERVICAL CONIZATION W/BX N/A 12/14/2016   Procedure: COLD KNIFE CONIZATION, REMOVAL OF BODY JEWELRY;  Surgeon: [place] Bing, MD;  Location: [facility] ORS;  Service: Gynecology;   Laterality: N/A;   ICD IMPLANT N/A 08/26/2021   Procedure: ICD IMPLANT;  Surgeon: Duke Salvia, MD;  Location: [facility] INVASIVE CV LAB;  Service: Cardiovascular;  Laterality: N/A;   RIGHT/LEFT HEART CATH AND CORONARY ANGIOGRAPHY N/A 08/30/2018   Procedure: RIGHT/LEFT HEART CATH AND CORONARY ANGIOGRAPHY;  Surgeon: Laurey Morale, MD;  Location: [facility] INVASIVE CV LAB;  Service: Cardiovascular;  Laterality: N/A;    Current Meds  Medication Sig   albuterol (VENTOLIN HFA) 108 (90 Base) MCG/ACT inhaler Inhale 2 puffs into the lungs every 6 (six) hours as needed for wheezing or shortness of breath.   atorvastatin (LIPITOR) 80 MG tablet Take 1 tablet (80 mg total) by mouth daily at 6 PM.   carvedilol (COREG) 12.5 MG tablet Take 1.5 tablets (18.75 mg total) by mouth 2 (two) times daily with a meal.   dapagliflozin propanediol (FARXIGA) 10 MG TABS tablet Take 1 tablet (10 mg total) by mouth daily before breakfast.   fluticasone (FLOVENT HFA) 110 MCG/ACT inhaler Inhale 2 puffs into the lungs 2 (two) times daily at 10 AM and 5 PM.   furosemide (LASIX) 20 MG tablet Take 1 tablet (20 mg total) by mouth daily.   ipratropium-albuterol (DUONEB) 0.5-2.5 (3) MG/3ML SOLN Take 3 mLs by nebulization every 6 (six) hours as  needed.   potassium chloride SA (KLOR-CON M) 20 MEQ tablet Take 20 mEq by mouth as needed. With Lasix   sacubitril-valsartan (ENTRESTO) 97-103 MG Take 1 tablet by mouth 2 (two) times daily.   spironolactone (ALDACTONE) 25 MG tablet Take 1 tablet (25 mg total) by mouth at bedtime.   warfarin (COUMADIN) 5 MG tablet TAKE AS DIRECTED BY ANTICOAGULATION CLINIC   [DISCONTINUED] acetaminophen (TYLENOL) 325 MG tablet Take 1-2 tablets (325-650 mg total) by mouth every 4 (four) hours as needed for mild pain.   [DISCONTINUED] potassium chloride SA (KLOR-CON M) 20 MEQ tablet Take 1 tablet (20 mEq total) by mouth daily.    Allergies  Allergen Reactions   Other Shortness Of Breath    Environmental allergies    Gluten Meal     Gluten makes her wheeze      Review of Systems negative except from HPI and PMH  Physical Exam BP 114/80   Pulse 73   Ht 5\' 1"  (1.549 m)   Wt 222 lb (100.7 kg)   SpO2 97%   BMI 41.95 kg/m  Well developed and well nourished in no acute distress HENT normal E scleral and icterus clear Neck Supple JVP flat; carotids brisk and full Clear Regular rate and rhythm, no murmurs gallops or rub Soft with active bowel sounds No clubbing cyanosis  Edema Alert and oriented, grossly normal motor and sensory function Skin Warm and Dry  ECG sinus at 73 Interval 17/08/39 Q waves 1, 2, 3, F, V4-V6    CrCl cannot be calculated (Patient's most recent lab result is older than the maximum 21 days allowed.).   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  Question related to device self check.  Will turn off pacing checks which would include impedance measurements and thresholds.  Interestingly, she did not feel ventricular pacing testing today.  This may or may not be the issue.  She is euvolemic.  We will continue her furosemide 20, spironolactone 12 and half in the [place].  Continue her Entresto and carvedilol for her cardiomyopathy.  Reach out to Dr. Reine Just as to whether with a negative echo she still needs her anticoagulation  Suspect elevated hemoglobin is spurious; we will recheck  Current medicines are reviewed at length with the patient today .  The patient does not Linna Hoff, her last echo have concerns regarding medicines.

## 2021-11-26 NOTE — Patient Instructions (Signed)
Description   BILL [id number] Today take 2 tablets then continue taking what you have been taking, Warfarin 2 tablets daily except for 1 tablet on Sundays and Thursdays. Recheck INR in 2 weeks.  Coumadin Clinic [phone].

## 2021-11-27 LAB — CBC
Hematocrit: 43.8 % (ref 34.0–46.6)
Hemoglobin: 15.2 g/dL (ref 11.1–15.9)
MCH: 32.5 pg (ref 26.6–33.0)
MCHC: 34.7 g/dL (ref 31.5–35.7)
MCV: 94 fL (ref 79–97)
Platelets: 253 10*3/uL (ref 150–450)
RBC: 4.68 x10E6/uL (ref 3.77–5.28)
RDW: 13.2 % (ref 11.7–15.4)
WBC: 5.8 10*3/uL (ref 3.4–10.8)

## 2021-12-02 ENCOUNTER — Other Ambulatory Visit (HOSPITAL_COMMUNITY): Payer: Self-pay | Admitting: *Deleted

## 2021-12-02 ENCOUNTER — Other Ambulatory Visit (HOSPITAL_COMMUNITY): Payer: Self-pay | Admitting: Cardiology

## 2021-12-02 DIAGNOSIS — J4540 Moderate persistent asthma, uncomplicated: Secondary | ICD-10-CM

## 2021-12-02 MED ORDER — DAPAGLIFLOZIN PROPANEDIOL 10 MG PO TABS: 10.0000 mg | ORAL_TABLET | Freq: Every day | ORAL | 3 refills | Status: DC

## 2021-12-02 MED ORDER — ATORVASTATIN CALCIUM 80 MG PO TABS: ORAL_TABLET | ORAL | 3 refills | Status: DC

## 2021-12-02 MED ORDER — SPIRONOLACTONE 25 MG PO TABS: 25.0000 mg | ORAL_TABLET | Freq: Every day | ORAL | 3 refills | Status: DC

## 2021-12-08 NOTE — Progress Notes (Signed)
Remote ICD transmission.   

## 2021-12-11 ENCOUNTER — Ambulatory Visit (INDEPENDENT_AMBULATORY_CARE_PROVIDER_SITE_OTHER): Payer: Medicaid Other

## 2021-12-11 DIAGNOSIS — Z5181 Encounter for therapeutic drug level monitoring: Secondary | ICD-10-CM | POA: Diagnosis not present

## 2021-12-11 DIAGNOSIS — Z7901 Long term (current) use of anticoagulants: Secondary | ICD-10-CM | POA: Diagnosis not present

## 2021-12-11 LAB — POCT INR: INR: 1.8 — AB (ref 2.0–3.0)

## 2021-12-11 NOTE — Patient Instructions (Signed)
Description   BILL [id number] Today take 3 tablets then continue taking Warfarin 2 tablets daily except for 1 tablet on Sundays and Thursdays. Recheck INR in 2 weeks.  Coumadin Clinic [phone].

## 2021-12-25 ENCOUNTER — Ambulatory Visit (INDEPENDENT_AMBULATORY_CARE_PROVIDER_SITE_OTHER): Payer: Medicaid Other

## 2021-12-25 DIAGNOSIS — Z5181 Encounter for therapeutic drug level monitoring: Secondary | ICD-10-CM

## 2021-12-25 DIAGNOSIS — I236 Thrombosis of atrium, auricular appendage, and ventricle as current complications following acute myocardial infarction: Secondary | ICD-10-CM | POA: Diagnosis not present

## 2021-12-25 LAB — POCT INR: INR: 2 (ref 2.0–3.0)

## 2022-01-01 ENCOUNTER — Other Ambulatory Visit: Payer: Self-pay | Admitting: Cardiology

## 2022-01-01 DIAGNOSIS — I236 Thrombosis of atrium, auricular appendage, and ventricle as current complications following acute myocardial infarction: Secondary | ICD-10-CM

## 2022-02-01 ENCOUNTER — Ambulatory Visit (INDEPENDENT_AMBULATORY_CARE_PROVIDER_SITE_OTHER): Payer: Medicaid Other

## 2022-02-01 DIAGNOSIS — Z5181 Encounter for therapeutic drug level monitoring: Secondary | ICD-10-CM | POA: Diagnosis not present

## 2022-02-01 DIAGNOSIS — I236 Thrombosis of atrium, auricular appendage, and ventricle as current complications following acute myocardial infarction: Secondary | ICD-10-CM | POA: Diagnosis not present

## 2022-02-01 LAB — POCT INR: INR: 2.2 (ref 2.0–3.0)

## 2022-02-01 NOTE — Patient Instructions (Addendum)
Description   BILL [id number] Take 2.5 tablets today since you recently missed a dose and then continue taking Warfarin 2 tablets daily except for 1 tablet on Sundays and Thursdays. Recheck INR in 4 weeks.  Coumadin Clinic [phone].

## 2022-02-10 ENCOUNTER — Other Ambulatory Visit: Payer: Self-pay

## 2022-02-10 DIAGNOSIS — I236 Thrombosis of atrium, auricular appendage, and ventricle as current complications following acute myocardial infarction: Secondary | ICD-10-CM

## 2022-02-10 MED ORDER — WARFARIN SODIUM 5 MG PO TABS: ORAL_TABLET | ORAL | 0 refills | Status: DC

## 2022-02-10 NOTE — Telephone Encounter (Signed)
Prescription refill request received for warfarin Lov: 11/26/21 Sheila Estes)  Next INR check: 03/01/22 Warfarin tablet strength: 5mg   Appropriate dose and refill sent to requested pharmacy.

## 2022-03-02 ENCOUNTER — Ambulatory Visit: Payer: Medicaid Other | Attending: Cardiology

## 2022-03-17 ENCOUNTER — Ambulatory Visit: Payer: Medicaid Other | Attending: Cardiology | Admitting: *Deleted

## 2022-03-17 DIAGNOSIS — Z5181 Encounter for therapeutic drug level monitoring: Secondary | ICD-10-CM

## 2022-03-17 DIAGNOSIS — I236 Thrombosis of atrium, auricular appendage, and ventricle as current complications following acute myocardial infarction: Secondary | ICD-10-CM

## 2022-03-17 LAB — POCT INR: INR: 1.7 — AB (ref 2.0–3.0)

## 2022-03-17 NOTE — Patient Instructions (Addendum)
Description   BILL 02233 Take take another 1/2 tablet today then continue taking Warfarin 2 tablets daily except for 1 tablet on Sundays and Thursdays. Recheck INR in 4 weeks. Coumadin Clinic [phone].

## 2022-03-18 ENCOUNTER — Other Ambulatory Visit: Payer: Self-pay

## 2022-03-18 ENCOUNTER — Ambulatory Visit (INDEPENDENT_AMBULATORY_CARE_PROVIDER_SITE_OTHER): Payer: Medicaid Other

## 2022-03-18 DIAGNOSIS — I236 Thrombosis of atrium, auricular appendage, and ventricle as current complications following acute myocardial infarction: Secondary | ICD-10-CM

## 2022-03-18 DIAGNOSIS — I255 Ischemic cardiomyopathy: Secondary | ICD-10-CM

## 2022-03-18 MED ORDER — WARFARIN SODIUM 5 MG PO TABS: ORAL_TABLET | ORAL | 0 refills | Status: DC

## 2022-03-18 NOTE — Telephone Encounter (Signed)
Prescription refill request received for warfarin Lov: 11/26/21 Sheila Estes) Next INR check: 04/12/22 Warfarin tablet strength: 5mg   Appropriate dose and refill sent to requested pharmacy.

## 2022-03-19 LAB — CUP PACEART REMOTE DEVICE CHECK
Battery Remaining Longevity: 180 mo
Battery Remaining Percentage: 100 %
Brady Statistic RV Percent Paced: 0 %
Date Time Interrogation Session: 20230913191100
HighPow Impedance: 62 Ohm
Implantable Lead Implant Date: 20230222
Implantable Lead Location: 753860
Implantable Lead Model: 137
Implantable Lead Serial Number: 301096
Implantable Pulse Generator Implant Date: 20230222
Lead Channel Setting Pacing Amplitude: 2.5 V
Lead Channel Setting Pacing Pulse Width: 0.4 ms
Lead Channel Setting Sensing Sensitivity: 0.5 mV
Pulse Gen Serial Number: 215988

## 2022-03-24 ENCOUNTER — Other Ambulatory Visit (HOSPITAL_COMMUNITY): Payer: Self-pay | Admitting: Cardiology

## 2022-03-24 DIAGNOSIS — J4540 Moderate persistent asthma, uncomplicated: Secondary | ICD-10-CM

## 2022-03-29 ENCOUNTER — Other Ambulatory Visit (HOSPITAL_COMMUNITY): Payer: Self-pay | Admitting: *Deleted

## 2022-03-29 DIAGNOSIS — J4540 Moderate persistent asthma, uncomplicated: Secondary | ICD-10-CM

## 2022-03-29 MED ORDER — ALBUTEROL SULFATE HFA 108 (90 BASE) MCG/ACT IN AERS: 2.0000 | INHALATION_SPRAY | Freq: Four times a day (QID) | RESPIRATORY_TRACT | 2 refills | Status: DC | PRN

## 2022-03-31 NOTE — Progress Notes (Signed)
Remote ICD transmission.   

## 2022-05-11 ENCOUNTER — Ambulatory Visit: Payer: Medicaid Other

## 2022-05-18 ENCOUNTER — Ambulatory Visit: Payer: Commercial Managed Care - HMO | Attending: Internal Medicine

## 2022-05-18 ENCOUNTER — Other Ambulatory Visit (HOSPITAL_COMMUNITY): Payer: Self-pay

## 2022-05-18 DIAGNOSIS — I236 Thrombosis of atrium, auricular appendage, and ventricle as current complications following acute myocardial infarction: Secondary | ICD-10-CM | POA: Diagnosis not present

## 2022-05-18 DIAGNOSIS — Z5181 Encounter for therapeutic drug level monitoring: Secondary | ICD-10-CM

## 2022-05-18 LAB — POCT INR: INR: 1.9 — AB (ref 2.0–3.0)

## 2022-05-18 NOTE — Patient Instructions (Signed)
BILL 26712 TAKE 2.5 TABLETS TODAY ONLY  then continue taking Warfarin 2 tablets daily except for 1 tablet on Sundays and Thursdays. Recheck INR in 4 weeks. Coumadin Clinic [phone].

## 2022-06-01 ENCOUNTER — Other Ambulatory Visit: Payer: Self-pay | Admitting: Pharmacist

## 2022-06-01 DIAGNOSIS — I236 Thrombosis of atrium, auricular appendage, and ventricle as current complications following acute myocardial infarction: Secondary | ICD-10-CM

## 2022-06-01 MED ORDER — WARFARIN SODIUM 5 MG PO TABS: ORAL_TABLET | ORAL | 0 refills | Status: DC

## 2022-06-04 ENCOUNTER — Telehealth: Payer: Self-pay | Admitting: Internal Medicine

## 2022-06-04 NOTE — Telephone Encounter (Signed)
Symptoms do not sound related to being off warfarin

## 2022-06-04 NOTE — Telephone Encounter (Signed)
Pt c/o medication issue:  1. Name of Medication:   warfarin (COUMADIN) 5 MG tablet    2. How are you currently taking this medication (dosage and times per day)?   3. Are you having a reaction (difficulty breathing--STAT)? Yes.   4. What is your medication issue? Patient states that she has been without this medication for over 10 days now due to funds and insurance and was finally able to get the medication today, but she feels jittery, light headed and over all not well. She states that she feels like she is about to pass out.

## 2022-06-04 NOTE — Telephone Encounter (Signed)
For past 2 days has felt terrible fatigue, weakness, small headache, not tried anything for this   no bp or heart rate checks   feels lightheaded, worse w walking around.  Has not called PCP.  She is going back to usual warfarin dosing today  - Fridays - taking 2 tablets when she picks it this afternoon.  She has concerns that her symptoms are because she has been without warfarin for 10 days.    A little swelling in stomach, no SOB.  Swelling is a little more than normal.     I adv her to call or send in bp and hr reading this afternoon and that I would forward to Dr. Graciela Husbands who is on vacation, device clinic, coumadin clinic and Dr. Shirlee Latch for review and recommendations.

## 2022-06-04 NOTE — Telephone Encounter (Signed)
Outreach made to Pt to send remote transmission.  She is not home right now, but she will be home around 5:00 pm and will send a transmission at that time.  Pt has a single chamber ICD.  She does not use device to pace.  She has historically received any therapy from her device.  NO episodes noted on last transmission received.

## 2022-06-04 NOTE — Telephone Encounter (Signed)
Hard to know what is causing symptoms, would probably start with PCP.

## 2022-06-07 NOTE — Telephone Encounter (Signed)
Followed up with patient. Patient reports she sent the transmission Saturday, although not received on our end. States the button turned orange and took about 20 minutes. Advised patient the button should not light up orange but other green or blue. Sent instructions via mychart and advised patient to call [employer] if the button light up orange again. Phone number is provided in instructions. Advised patient we will follow up. Patient voiced understanding.

## 2022-06-10 NOTE — Telephone Encounter (Signed)
Remote transmission received and reviewed. Shows normal device function. No episodes logged. Heart logic 21.

## 2022-06-10 NOTE — Telephone Encounter (Signed)
Called patient to follow up. States she is not home right but will send the transmission and check her BP/HR when she gets home. Reports today she is feeling much better. Requested patient to call after she sends transmission or if she has issues with sending. Patient agreeable to plan.

## 2022-06-14 ENCOUNTER — Other Ambulatory Visit (HOSPITAL_COMMUNITY): Payer: Self-pay | Admitting: Family Medicine

## 2022-06-14 ENCOUNTER — Other Ambulatory Visit (HOSPITAL_COMMUNITY): Payer: Self-pay | Admitting: Cardiology

## 2022-06-15 ENCOUNTER — Ambulatory Visit: Payer: Commercial Managed Care - HMO

## 2022-06-15 NOTE — Telephone Encounter (Signed)
Spoke with pt and advised of normal device function and no episodes per transmission sent on 06/09/2022.  Pt reports she is feeling much better and needs nothing further at this time.

## 2022-06-17 ENCOUNTER — Ambulatory Visit: Payer: Medicaid Other

## 2022-06-22 ENCOUNTER — Ambulatory Visit (INDEPENDENT_AMBULATORY_CARE_PROVIDER_SITE_OTHER): Payer: Commercial Managed Care - HMO

## 2022-06-22 DIAGNOSIS — I255 Ischemic cardiomyopathy: Secondary | ICD-10-CM

## 2022-06-22 LAB — CUP PACEART REMOTE DEVICE CHECK
Battery Remaining Longevity: 174 mo
Battery Remaining Percentage: 100 %
Brady Statistic RV Percent Paced: 0 %
Date Time Interrogation Session: 20231214054800
HighPow Impedance: 67 Ohm
Implantable Lead Connection Status: 753985
Implantable Lead Implant Date: 20230222
Implantable Lead Location: 753860
Implantable Lead Model: 137
Implantable Lead Serial Number: 301096
Implantable Pulse Generator Implant Date: 20230222
Lead Channel Setting Pacing Amplitude: 2.5 V
Lead Channel Setting Pacing Pulse Width: 0.4 ms
Lead Channel Setting Sensing Sensitivity: 0.5 mV
Pulse Gen Serial Number: 215988

## 2022-07-13 ENCOUNTER — Ambulatory Visit: Payer: Commercial Managed Care - HMO

## 2022-07-19 ENCOUNTER — Ambulatory Visit: Payer: Commercial Managed Care - HMO | Attending: Cardiology

## 2022-07-19 DIAGNOSIS — I236 Thrombosis of atrium, auricular appendage, and ventricle as current complications following acute myocardial infarction: Secondary | ICD-10-CM

## 2022-07-19 DIAGNOSIS — Z5181 Encounter for therapeutic drug level monitoring: Secondary | ICD-10-CM | POA: Diagnosis not present

## 2022-07-19 LAB — POCT INR: INR: 4 — AB (ref 2.0–3.0)

## 2022-07-19 NOTE — Progress Notes (Signed)
Remote ICD transmission.   

## 2022-07-19 NOTE — Patient Instructions (Signed)
BILL [id number] HOLD TUESDAY ONLY  then continue taking Warfarin 2 tablets daily except for 1 tablet on Sundays and Thursdays. Recheck INR in 2 weeks. Coumadin Clinic [phone].

## 2022-08-02 ENCOUNTER — Ambulatory Visit: Payer: Commercial Managed Care - HMO

## 2022-08-09 ENCOUNTER — Ambulatory Visit: Payer: Commercial Managed Care - HMO

## 2022-08-10 ENCOUNTER — Ambulatory Visit: Payer: Commercial Managed Care - HMO | Attending: Internal Medicine

## 2022-08-10 DIAGNOSIS — Z5181 Encounter for therapeutic drug level monitoring: Secondary | ICD-10-CM

## 2022-08-10 DIAGNOSIS — I236 Thrombosis of atrium, auricular appendage, and ventricle as current complications following acute myocardial infarction: Secondary | ICD-10-CM | POA: Diagnosis not present

## 2022-08-10 LAB — POCT INR: INR: 1.2 — AB (ref 2.0–3.0)

## 2022-08-10 NOTE — Patient Instructions (Signed)
BILL [id number]; TAKE 3 TABLETS TODAY AND WEDNESDAY  then continue taking Warfarin 2 tablets daily except for 1 tablet on Sundays and Thursdays. Recheck INR in 3 weeks. Coumadin Clinic [phone].

## 2022-08-11 ENCOUNTER — Other Ambulatory Visit: Payer: Self-pay | Admitting: Internal Medicine

## 2022-08-11 DIAGNOSIS — I236 Thrombosis of atrium, auricular appendage, and ventricle as current complications following acute myocardial infarction: Secondary | ICD-10-CM

## 2022-08-11 NOTE — Telephone Encounter (Addendum)
Warfarin 5mg  refill  Last INR Visit 08/11/22 Last OV 11/26/21

## 2022-08-18 ENCOUNTER — Telehealth (HOSPITAL_COMMUNITY): Payer: Self-pay

## 2022-08-18 DIAGNOSIS — I236 Thrombosis of atrium, auricular appendage, and ventricle as current complications following acute myocardial infarction: Secondary | ICD-10-CM

## 2022-08-18 MED ORDER — WARFARIN SODIUM 5 MG PO TABS: ORAL_TABLET | ORAL | 0 refills | Status: DC

## 2022-08-18 NOTE — Telephone Encounter (Signed)
Patient called and stated her pharmacy never received refill of her Warfarin and she is now out. Can you please send in? To [facility]

## 2022-08-18 NOTE — Addendum Note (Signed)
Addended by: Leonidas Romberg on: 08/18/2022 10:37 AM   Modules accepted: Orders

## 2022-08-18 NOTE — Telephone Encounter (Signed)
Prescription refill request received for warfarin Lov: 11/26/21 Next INR check: 08/30/22 Warfarin tablet strength: 7m  Appropriate dose. Refill sent.

## 2022-08-30 ENCOUNTER — Ambulatory Visit: Payer: Commercial Managed Care - HMO

## 2022-09-06 ENCOUNTER — Ambulatory Visit (INDEPENDENT_AMBULATORY_CARE_PROVIDER_SITE_OTHER): Payer: Commercial Managed Care - HMO | Admitting: Primary Care

## 2022-09-22 ENCOUNTER — Ambulatory Visit (INDEPENDENT_AMBULATORY_CARE_PROVIDER_SITE_OTHER): Payer: Commercial Managed Care - HMO

## 2022-09-22 DIAGNOSIS — I255 Ischemic cardiomyopathy: Secondary | ICD-10-CM

## 2022-09-23 LAB — CUP PACEART REMOTE DEVICE CHECK
Battery Remaining Longevity: 168 mo
Battery Remaining Percentage: 100 %
Brady Statistic RV Percent Paced: 0 %
Date Time Interrogation Session: 20240320073200
HighPow Impedance: 66 Ohm
Implantable Lead Connection Status: 753985
Implantable Lead Implant Date: 20230222
Implantable Lead Location: 753860
Implantable Lead Model: 137
Implantable Lead Serial Number: 301096
Implantable Pulse Generator Implant Date: 20230222
Lead Channel Setting Pacing Amplitude: 2.5 V
Lead Channel Setting Pacing Pulse Width: 0.4 ms
Lead Channel Setting Sensing Sensitivity: 0.5 mV
Pulse Gen Serial Number: 215988

## 2022-10-25 ENCOUNTER — Ambulatory Visit: Payer: Commercial Managed Care - HMO | Attending: Cardiovascular Disease | Admitting: Pharmacist

## 2022-10-25 DIAGNOSIS — I236 Thrombosis of atrium, auricular appendage, and ventricle as current complications following acute myocardial infarction: Secondary | ICD-10-CM

## 2022-10-25 DIAGNOSIS — Z5181 Encounter for therapeutic drug level monitoring: Secondary | ICD-10-CM

## 2022-10-25 LAB — POCT INR: INR: 1.1 — AB (ref 2.0–3.0)

## 2022-10-25 MED ORDER — WARFARIN SODIUM 5 MG PO TABS: ORAL_TABLET | ORAL | 0 refills | Status: DC

## 2022-10-25 NOTE — Patient Instructions (Signed)
Description   BILL [address]; take 3 tablets today and tomorrow,  then resume taking Warfarin 2 tablets daily except for 1 tablet on Sundays and Thursdays. Recheck INR in 2 weeks. Coumadin Clinic [phone].

## 2022-10-26 ENCOUNTER — Other Ambulatory Visit (HOSPITAL_COMMUNITY): Payer: Self-pay | Admitting: Family Medicine

## 2022-10-26 ENCOUNTER — Telehealth: Payer: Self-pay | Admitting: Licensed Clinical Social Worker

## 2022-10-26 NOTE — Telephone Encounter (Signed)
H&V Care Navigation CSW Progress Note  Clinical Social Worker contacted patient by phone to f/u after appt with coumadin clinic, appears pt still only has [employer]. No answer this morning at [phone], left voicemail requesting return call will re-attempt as able.  Patient is participating in a Managed Medicaid Plan:  No, Medicaid family planning  SDOH Screenings   Depression (PHQ2-9): Low Risk  (05/20/2020)  Tobacco Use: Low Risk  (11/26/2021)    Octavio Graves, MSW, LCSW Clinical Social Worker II [facility] Heart/Vascular Care Navigation  [phone]- work cell phone (preferred) [phone]- desk phone

## 2022-10-28 NOTE — Progress Notes (Signed)
Remote ICD transmission.   

## 2022-11-02 ENCOUNTER — Telehealth: Payer: Self-pay | Admitting: Licensed Clinical Social Worker

## 2022-11-02 NOTE — Progress Notes (Signed)
Heart and Vascular Care Navigation  10/26/2022  Sheila Estes [birth year] [id number]  Reason for Referral: uninsured Patient is participating in a Managed Medicaid Plan: No, family planning only  Engaged with patient by telephone for initial visit for Heart and Vascular Care Coordination.                                                                                                   Assessment:                                     LCSW spoke with pt via telephone, pt returned call. 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 She shares she hasn't checked to see if she is eligible for coverage under expansion. Confirmed home address, PCP and lives with her child. Emergency contacts remain the same. LCSW will send assistance with assessing coverage under expansion. If not eligible then should apply for orange card, cone financial assistance and for medication assistance. Will send these all to home address. No other SDOH concerns expressed at this time.   HRT/VAS Care Coordination     Patients Home Cardiology Office Heart Failure Clinic   Outpatient Care Team Social Worker   Social Worker Name: Octavio Graves, [place], [phone]   Living arrangements for the past 2 months Apartment   Lives with: Minor Children   Patient Has Concern With Paying Medical Bills Yes   Patient Concerns With Medical Bills no coverage, ongoing medical care   Medical Bill Referrals: DSS for review for full Medicaid under expansion;  [facility] Financial Assistance   Does Patient Have Prescription Coverage? No   Home Assistive Devices/Equipment None       Social History:                                                                             SDOH Screenings   Housing: Low Risk  (11/02/2022)  Transportation Needs: No Transportation Needs (11/02/2022)  Utilities: Not At Risk (11/02/2022)  Depression (PHQ2-9): Low Risk  (05/20/2020)  Financial Resource Strain: Medium  Risk (11/02/2022)  Tobacco Use: Low Risk  (11/26/2021)    SDOH Interventions: Financial Resources:  Financial Strain Interventions: Other (Comment) (DSS for review for full Medicaid; [facility] Financial controller) Editor, commissioning for [employer] Program  Food Insecurity:   Not asked at this time  Housing Insecurity:  Housing Interventions: Intervention Not Indicated  Transportation:   Transportation Interventions: Intervention Not Indicated    Other Care Navigation Interventions:     Provided Pharmacy assistance resources  Mailed Farxiga PAP and NCMedassist application   Follow-up plan:   LCSW mailed pt my card, Haematologist, Medicaid flyer with  information about DSS caseworkers at [facility], Farxiga PAP and [facility] application. I will f/u with pt moving forward.

## 2022-11-02 NOTE — Telephone Encounter (Signed)
H&V Care Navigation CSW Progress Note  Clinical Social Worker contacted patient by phone to f/u on assistance applications sent, reached her at [phone]. Pt confirmed receipt- has looked at them but not started process. Will work on them and let me know if any questions/concerns arise. I will reach out again to check for any needs.  Patient is participating in a Managed Medicaid Plan:  No, family planning only  SDOH Screenings   Housing: Low Risk  (11/02/2022)  Transportation Needs: No Transportation Needs (11/02/2022)  Utilities: Not At Risk (11/02/2022)  Depression (PHQ2-9): Low Risk  (05/20/2020)  Financial Resource Strain: Medium Risk (11/02/2022)  Tobacco Use: Low Risk  (11/26/2021)   Octavio Graves, MSW, LCSW Clinical Social Worker II [facility] Health Heart/Vascular Care Navigation  [phone]- work cell phone (preferred) [phone]- desk phone

## 2022-11-08 ENCOUNTER — Ambulatory Visit: Payer: Medicaid Other

## 2022-11-08 ENCOUNTER — Telehealth (HOSPITAL_COMMUNITY): Payer: Self-pay

## 2022-11-08 ENCOUNTER — Other Ambulatory Visit (HOSPITAL_COMMUNITY): Payer: Self-pay

## 2022-11-08 MED ORDER — DAPAGLIFLOZIN PROPANEDIOL 10 MG PO TABS: 10.0000 mg | ORAL_TABLET | Freq: Every day | ORAL | 0 refills | Status: DC

## 2022-11-08 NOTE — Telephone Encounter (Signed)
Patient called requesting refill on her [place]. She has not been seen since 2023 and I informed her I can send in 30DS but she will need to make appointment. She agreed to call back and make appointment.

## 2022-11-15 ENCOUNTER — Ambulatory Visit: Payer: Medicaid Other | Attending: Internal Medicine

## 2022-11-15 DIAGNOSIS — Z5181 Encounter for therapeutic drug level monitoring: Secondary | ICD-10-CM

## 2022-11-15 DIAGNOSIS — I236 Thrombosis of atrium, auricular appendage, and ventricle as current complications following acute myocardial infarction: Secondary | ICD-10-CM | POA: Diagnosis not present

## 2022-11-15 LAB — POCT INR: INR: 1.5 — AB (ref 2.0–3.0)

## 2022-11-15 NOTE — Patient Instructions (Signed)
BILL 16109; take 3 tablets today only then resume taking Warfarin 2 tablets daily except for 1 tablet on Sundays and Thursdays. Recheck INR in 2 weeks per pt request. Coumadin Clinic [phone].

## 2022-11-17 ENCOUNTER — Telehealth: Payer: Self-pay | Admitting: Licensed Clinical Social Worker

## 2022-11-17 NOTE — Telephone Encounter (Signed)
H&V Care Navigation CSW Progress Note  Clinical Social Worker contacted patient by phone to f/u on assistance applications. Was able to reach pt this morning at [phone]. She requested to give me a call back in 15 minutes. I will be available.   Pt did return my call, we discussed applications sent, where to bring/send documents. Pt still has not been by the DSS office or [facility] walk in clinic to be screened for Medcaid again encouraged to her to do that before sending in applications. Pt was at work therefore unfortunately didn't have the paperwork in front of her - we discussed where and how to submit applications but I requested she call me when she has been screened for Medicaid and we can review applications again. I will f/u one more time to try and encourage pt to complete applications.  Patient is participating in a Managed Medicaid Plan:  No, self pay only.  SDOH Screenings   Housing: Low Risk  (11/02/2022)  Transportation Needs: No Transportation Needs (11/02/2022)  Utilities: Not At Risk (11/02/2022)  Depression (PHQ2-9): Low Risk  (05/20/2020)  Financial Resource Strain: Medium Risk (11/02/2022)  Tobacco Use: Low Risk  (11/26/2021)   Octavio Graves, MSW, LCSW Clinical Social Worker II [facility] Health Heart/Vascular Care Navigation  [phone]- work cell phone (preferred) [phone]- desk phone

## 2022-11-26 ENCOUNTER — Telehealth: Payer: Self-pay | Admitting: Licensed Clinical Social Worker

## 2022-11-26 NOTE — Telephone Encounter (Signed)
H&V Care Navigation CSW Progress Note  Clinical Social Worker contacted patient by phone to f/u and encourage her to bring applications to coumadin appt next week. Pt reached at [phone] confirmed she has completed applications. She still hasn't gotten re-screened for Medicaid, states she drove down [address] and didn't know where to go. We discussed where caseworkers pop up office is at [facility], I also have sent her information to Cathlyn Parsons today with her permission, [place] DSS Caseworker. No additional questions from pt. She will try and bring paperwork by office as able. She understands she needs to drop PAP applications off at Dr. Kathlyn [place] office.  I received the following email from Lauren: "I just wanted to let you know that Aleijah Runnels already currently has [facility] (Medicaid ID # [id number] M).  I talked over her current circumstances with her and will be getting the information to her caseworker to consider a change of circumstance re-evaluation.   Thanks!"  Patient is participating in a Managed Medicaid Plan:  No, currently only Family Planning  SDOH Screenings   Housing: Low Risk  (11/02/2022)  Transportation Needs: No Transportation Needs (11/02/2022)  Utilities: Not At Risk (11/02/2022)  Depression (PHQ2-9): Low Risk  (05/20/2020)  Financial Resource Strain: Medium Risk (11/02/2022)  Tobacco Use: Low Risk  (11/26/2021)   Octavio Graves, MSW, LCSW Clinical Social Worker II [facility] Health Heart/Vascular Care Navigation  [phone]- work cell phone (preferred) [phone]- desk phone

## 2022-11-30 ENCOUNTER — Ambulatory Visit: Payer: Self-pay

## 2022-12-06 ENCOUNTER — Ambulatory Visit (INDEPENDENT_AMBULATORY_CARE_PROVIDER_SITE_OTHER): Payer: Self-pay | Admitting: *Deleted

## 2022-12-06 ENCOUNTER — Other Ambulatory Visit (HOSPITAL_COMMUNITY): Payer: Self-pay

## 2022-12-06 ENCOUNTER — Telehealth (HOSPITAL_COMMUNITY): Payer: Self-pay | Admitting: Cardiology

## 2022-12-06 ENCOUNTER — Telehealth: Payer: Self-pay | Admitting: Licensed Clinical Social Worker

## 2022-12-06 NOTE — Telephone Encounter (Signed)
H&V Care Navigation CSW Progress Note  Clinical Social Worker contacted patient by phone to f/u on approval for Medicaid. Per [place] DSS caseworker Cathlyn Parsons she contacted pt caseworker and received the following "Good news - the caseworker completed a re-evaluation and Ms. Falkner is showing active Medicaid (for 10/04/22 onward!).  Hopefully this will show in NCTracks soon. Medicaid ID# [id number] M"  Pt has been updated with this information. Some of her medications may require prior authorization. I will send to HF Pharmacy Tech team.    Patient is participating in a Managed Medicaid Plan:  yes-# [id number] M   SDOH Screenings   Housing: Low Risk  (11/02/2022)  Transportation Needs: No Transportation Needs (11/02/2022)  Utilities: Not At Risk (11/02/2022)  Depression (PHQ2-9): Low Risk  (05/20/2020)  Financial Resource Strain: Medium Risk (11/02/2022)  Tobacco Use: Low Risk  (11/26/2021)   Octavio Graves, MSW, LCSW Clinical Social Worker II [facility] Health Heart/Vascular Care Navigation  [phone]- work cell phone (preferred) [phone]- desk phone

## 2022-12-06 NOTE — Telephone Encounter (Signed)
Pt left message on triage line requested pregnancy test -returned call to patient advising she contact PCP Pt voiced understanding

## 2022-12-06 NOTE — Telephone Encounter (Signed)
  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 Patient states her heart disease makes her extremely high risk- and she wants to be checked as soon as possible.    Disposition: [] ED /[] Urgent Care (no appt availability in office) / [] Appointment(In office/virtual)/ []  [place] [facility]/ [] Home Care/ [] Refused Recommended Disposition /[] [place] Mobile Bus/ [x]  Follow-up with PCP Additional Notes: Message sent to provider - patient is requesting appointment anytime after 2:30- she is Runner, broadcasting/film/video. There are no open appointments this week.

## 2022-12-06 NOTE — Telephone Encounter (Signed)
Returned pt call and made her aware that provider doesn't have any appts available this week. Made pt aware that because it has been over 2 years since she has been seen she is considered a new pt. Made pt aware that provider doesn't have any upcoming new patient appts. Made pt aware that she can be seen at the urgent care. Pt states she understands and doesn't have any questions or concerns

## 2022-12-06 NOTE — Telephone Encounter (Signed)
Summary: Pregancy Testing Advice   Patient is calling to schedule an appt for pregnancy testing. However, Marcelino Duster does not have any appts until next week. Pt reports congestive heart failure and it can be dangerous for her to be pregnant. Please advise where the patient can be seen sooner for pregnancy testing.     Reason for Disposition  Pregnancy suspected or possible  Answer Assessment - Initial Assessment Questions 1. LMP:  "When did your last menstrual period begin?"     11/05/22 2. DAYS LATE: "How many days late is your menstrual period?"     3 days 3. REGULARITY: "How regular are your periods?"     yes 4. PREGNANCY: "Is there any chance you are pregnant?" (e.g., unprotected intercourse, missed birth control pill, broken condom) "Have you used a home pregnancy test?"     Yes- no birth control 5. BREASTFEEDING: "Are you breastfeeding?"     no 6. BIRTH CONTROL PILLS: "Are you taking birth control pills, or have you stopped recently?"     no 7. LONG-ACTING CONTRACEPTION: "Has your doctor given you a shot to prevent pregnancy?" (e.g., Depo-Provera injection) "Do you have an intrauterine device (IUD)".     no 8. CAUSE: "What do you think caused the missed period?" (e.g., stress, rapid weight loss, excessive exercise)     Possible pregnancy 9. OTHER SYMPTOMS: "Do you have any other symptoms?" (e.g., abdomen pain)     Standard symptoms  Protocols used: Menstrual Period - Missed or Late-A-AH

## 2022-12-07 ENCOUNTER — Other Ambulatory Visit (HOSPITAL_COMMUNITY): Payer: Self-pay | Admitting: Family Medicine

## 2022-12-07 ENCOUNTER — Other Ambulatory Visit (HOSPITAL_COMMUNITY): Payer: Self-pay

## 2022-12-08 ENCOUNTER — Other Ambulatory Visit (HOSPITAL_COMMUNITY): Payer: Self-pay

## 2022-12-08 NOTE — Telephone Encounter (Signed)
H&V Care Navigation CSW Progress Note  Clinical Social Worker  reviewed pt benefits on NCTracks  to see if it was updated in the system. Pt has been approved for [facility], [id number] M. Will route back to pharmacy team for prior auth needs.   Patient is participating in a Managed Medicaid Plan:  Yes- Blue Medicaid  SDOH Screenings   Housing: Low Risk  (11/02/2022)  Transportation Needs: No Transportation Needs (11/02/2022)  Utilities: Not At Risk (11/02/2022)  Depression (PHQ2-9): Low Risk  (05/20/2020)  Financial Resource Strain: Medium Risk (11/02/2022)  Tobacco Use: Low Risk  (11/26/2021)   Octavio Graves, MSW, LCSW Clinical Social Worker II [facility] Health Heart/Vascular Care Navigation  [phone]- work cell phone (preferred) [phone]- desk phone

## 2022-12-17 ENCOUNTER — Other Ambulatory Visit (HOSPITAL_COMMUNITY): Payer: Self-pay

## 2022-12-17 DIAGNOSIS — I5022 Chronic systolic (congestive) heart failure: Secondary | ICD-10-CM

## 2022-12-17 MED ORDER — ATORVASTATIN CALCIUM 80 MG PO TABS: ORAL_TABLET | ORAL | 0 refills | Status: DC

## 2022-12-17 MED ORDER — FUROSEMIDE 20 MG PO TABS: 20.0000 mg | ORAL_TABLET | Freq: Every day | ORAL | 3 refills | Status: DC

## 2022-12-20 ENCOUNTER — Other Ambulatory Visit (HOSPITAL_COMMUNITY): Payer: Self-pay

## 2022-12-22 ENCOUNTER — Ambulatory Visit (INDEPENDENT_AMBULATORY_CARE_PROVIDER_SITE_OTHER): Payer: Medicaid Other

## 2022-12-22 DIAGNOSIS — I255 Ischemic cardiomyopathy: Secondary | ICD-10-CM | POA: Diagnosis not present

## 2022-12-23 LAB — CUP PACEART REMOTE DEVICE CHECK
Battery Remaining Longevity: 162 mo
Battery Remaining Percentage: 100 %
Brady Statistic RV Percent Paced: 0 %
Date Time Interrogation Session: 20240619081600
HighPow Impedance: 59 Ohm
Implantable Lead Connection Status: 753985
Implantable Lead Implant Date: 20230222
Implantable Lead Location: 753860
Implantable Lead Model: 137
Implantable Lead Serial Number: 301096
Implantable Pulse Generator Implant Date: 20230222
Lead Channel Setting Pacing Amplitude: 2.5 V
Lead Channel Setting Pacing Pulse Width: 0.4 ms
Lead Channel Setting Sensing Sensitivity: 0.5 mV
Pulse Gen Serial Number: 215988

## 2023-01-10 ENCOUNTER — Ambulatory Visit: Payer: Medicaid Other

## 2023-02-15 ENCOUNTER — Encounter (HOSPITAL_COMMUNITY): Payer: Self-pay | Admitting: Cardiology

## 2023-02-15 ENCOUNTER — Ambulatory Visit (HOSPITAL_COMMUNITY)
Admission: RE | Admit: 2023-02-15 | Discharge: 2023-02-15 | Disposition: A | Discharge status: Home / Self Care | Payer: Medicaid Other | Source: Ambulatory Visit | Attending: Cardiology | Admitting: Cardiology

## 2023-02-15 VITALS — BP 136/86 | HR 80 | Wt 214.6 lb

## 2023-02-15 DIAGNOSIS — I5022 Chronic systolic (congestive) heart failure: Secondary | ICD-10-CM | POA: Diagnosis not present

## 2023-02-15 DIAGNOSIS — E669 Obesity, unspecified: Secondary | ICD-10-CM | POA: Diagnosis not present

## 2023-02-15 DIAGNOSIS — Z7901 Long term (current) use of anticoagulants: Secondary | ICD-10-CM | POA: Insufficient documentation

## 2023-02-15 DIAGNOSIS — I2510 Atherosclerotic heart disease of native coronary artery without angina pectoris: Secondary | ICD-10-CM | POA: Insufficient documentation

## 2023-02-15 DIAGNOSIS — F319 Bipolar disorder, unspecified: Secondary | ICD-10-CM | POA: Diagnosis not present

## 2023-02-15 DIAGNOSIS — Z6841 Body Mass Index (BMI) 40.0 and over, adult: Secondary | ICD-10-CM | POA: Diagnosis not present

## 2023-02-15 LAB — BASIC METABOLIC PANEL
Anion gap: 9 (ref 5–15)
BUN: 13 mg/dL (ref 6–20)
CO2: 26 mmol/L (ref 22–32)
Calcium: 9 mg/dL (ref 8.9–10.3)
Chloride: 104 mmol/L (ref 98–111)
Creatinine, Ser: 0.74 mg/dL (ref 0.44–1.00)
GFR, Estimated: >60 mL/min (ref >60)
Glucose, Bld: 89 mg/dL (ref 70–99)
Potassium: 3.4 mmol/L — ABNORMAL LOW (ref 3.5–5.1)
Sodium: 139 mmol/L (ref 135–145)

## 2023-02-15 LAB — CBC
HCT: 41 % (ref 36.0–46.0)
Hemoglobin: 14 g/dL (ref 12.0–15.0)
MCH: 32.9 pg (ref 26.0–34.0)
MCHC: 34.1 g/dL (ref 30.0–36.0)
MCV: 96.5 fL (ref 80.0–100.0)
Platelets: 276 10*3/uL (ref 150–400)
RBC: 4.25 MIL/uL (ref 3.87–5.11)
RDW: 14 % (ref 11.5–15.5)
WBC: 6.7 10*3/uL (ref 4.0–10.5)
nRBC: 0 % (ref 0.0–0.2)

## 2023-02-15 LAB — LIPID PANEL
Cholesterol: 170 mg/dL (ref 0–200)
HDL: 55 mg/dL (ref >40)
LDL Cholesterol: 89 mg/dL (ref 0–99)
Total CHOL/HDL Ratio: 3.1 RATIO
Triglycerides: 131 mg/dL (ref <150)
VLDL: 26 mg/dL (ref 0–40)

## 2023-02-15 LAB — BRAIN NATRIURETIC PEPTIDE: B Natriuretic Peptide: 746.1 pg/mL — ABNORMAL HIGH (ref 0.0–100.0)

## 2023-02-15 MED ORDER — CARVEDILOL 25 MG PO TABS: 25.0000 mg | ORAL_TABLET | Freq: Two times a day (BID) | ORAL | 3 refills | Status: DC

## 2023-02-15 MED ORDER — POTASSIUM CHLORIDE CRYS ER 20 MEQ PO TBCR: 20.0000 meq | EXTENDED_RELEASE_TABLET | Freq: Every day | ORAL | 3 refills | Status: DC

## 2023-02-15 MED ORDER — FUROSEMIDE 20 MG PO TABS
20.0000 mg | ORAL_TABLET | Freq: Every day | ORAL | 3 refills | Status: DC
Start: 2023-02-15 — End: 2023-09-21

## 2023-02-15 NOTE — Patient Instructions (Signed)
INCREASE Lasix 40 mg daily for 3 days, then 20 mg daily after that.  INCREASE Potassium to 20 mEq (1 Tab) daily.  INCREASE Carvedilol to 25 mg Twice daily  Labs done today, your results will be available in MyChart, we will contact you for abnormal readings.  Repeat blood work in 10 days.  Your physician recommends that you schedule a follow-up appointment in: 3 weeks   If you have any questions or concerns before your next appointment please send [place] a message through [place] or call our office at [phone].    TO LEAVE A MESSAGE FOR THE NURSE SELECT OPTION 2, PLEASE LEAVE A MESSAGE INCLUDING: YOUR NAME DATE OF BIRTH CALL BACK NUMBER REASON FOR CALL**this is important as we prioritize the call backs  YOU WILL RECEIVE A CALL BACK THE SAME DAY AS LONG AS YOU CALL BEFORE 4:00 PM  At the [facility], you and your health needs are our priority. As part of our continuing mission to provide you with exceptional heart care, we have created designated Provider Care Teams. These Care Teams include your primary Cardiologist (physician) and Advanced Practice Providers (APPs- Physician Assistants and Nurse Practitioners) who all work together to provide you with the care you need, when you need it.   You may see any of the following providers on your designated Care Team at your next follow up: Dr Arvilla Meres Dr Marca Ancona Dr. Marcos Eke, NP Robbie Lis, [place] [facility] [place], [place] Brynda Peon, NP Karle Plumber, PharmD   Please be sure to bring in all your medications bottles to every appointment.    Thank you for choosing [facility]

## 2023-02-16 NOTE — Progress Notes (Signed)
Date:  02/16/2023   ID:  Sheila Estes, DOB 10/24/1990, MRN [id number]   Provider location: [facility] Type of Visit: Established patient   PCP:  Grayce Sessions, NP  HF Cardiologist:  Dr. Shirlee Latch   History of Present Illness: Ms Tape is a 32 y.o. with a history of history of obesity, anxiety, asthma, bipolar disorder, depression, and CAD, chronic systolic heart failure.  Admitted 2/23-09/04/18 with pulmonary edema. She had been short of breath for about a month, which started after drinking 2 Red Bulls. She had been seen at Urgent Care and [facility] ER and was treated for asthma and for PNA. Echo at 2/20 admission showed newly reduced EF 25-30% with peri-apical akinesis and apical thrombus. Transferred to [facility] and had LHC/RHC, which showed normal filling pressures and preserved cardiac output.  The proximal LAD was subtotally occluded, it looked like a SCAD event. Cardiac MRI suggested that LAD territory was infarcted and unlikely to be viable with restoration of perfusion. HF medications were optimized and she was set up with LifeVest for discharge. She was started on coumadin for LV thrombus. DC weight: 198 lbs.  Seen in the [facility] 02/2019. At that time she had self discontinued her Lifevest and had returned it. Echo was repeated at visit and EF remained severely reduced at 20-25% and RV was moderately reduced.   On coumadin for LV thrombus. INRs followed by [facility] coumadin clinic.   Echo 12/22 EF 25-30%, peri-apical akinesis, no LV thrombus, mildly decreased RV systolic function, normal IVC.   S/p [employer] ICD 2/23.  Today she returns for HF follow up. We have not seen her for over a year.  She has a lot of anxiety around her heart failure diagnosis and it scares her to come to appointments.  She says that she has been taking her medications most of the time.  Only taking Lasix about twice a week.  She recently moved apartments, notes dyspnea carrying  boxes up to her 3rd floor apartment.  No dyspnea walking on flat ground.  No orthopnea/PND.  No chest pain.  No lightheadedness or syncope.  No palpitations.  Weight down 4 lbs.    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   Labs (3/20): K 3.9, creatinine 0.79 Labs (8/21): K 3.6 creatinine 0.71 Labs (10/21): K 4, creatinine 0.62, LDL 57 Labs (8/22): K 3.8, creatinine 0.7 Labs (2/23): K 4.3, creatinine 0.82 Labs (4/23): K 4.1, creatinine 0.78, LDL 74  PMH: 1. Depression 2. Anxiety 3. Asthma 4. Chronic systolic CHF: Ischemic cardiomyopathy.   - Echo (2/20) with EF 25-30%, peri-apical akinesis, LV thrombus.  - LHC/RHC (2/20): Subtotal LAD occlusion after S1.  Appearance of LAD suggests SCAD.  Mean RA 1, PA 36/8 mean 23, mean PCWP 12, CI 2.46.  - Cardiac MRI (2/20): Mildly dilated LV with EF 27%, delayed enhancement images suggest a dense LAD-territory infarct (myocardium unlikely to be viable), LV apical thrombus, RV EF 31%.  - Echo (12/22): EF 25-30%, peri-apical akinesis, no LV thrombus, mildly decreased RV systolic function, normal IVC.  - s/p [employer] ICD 2/23. 5. CAD: LHC (2/20) with subtotal LAD occlusion after S1.  Appearance of LAD suggests SCAD.  Current Outpatient Medications  Medication Sig Dispense Refill   albuterol (VENTOLIN HFA) 108 (90 Base) MCG/ACT inhaler Inhale 2 puffs into the lungs every 6 (six) hours as  needed for wheezing or shortness of breath. 1 each 2   atorvastatin (LIPITOR) 80 MG tablet TAKE 1 TABLET(80 MG) BY MOUTH DAILY AT 6 PM 90 tablet 0   dapagliflozin propanediol (FARXIGA) 10 MG TABS tablet Take 1 tablet (10 mg total) by mouth daily before breakfast. Last refill until appointment made 30 tablet 0   ENTRESTO 97-103 MG TAKE 1 TABLET BY MOUTH TWICE DAILY 60 tablet 11   fluticasone (FLOVENT HFA) 110 MCG/ACT inhaler Inhale 2 puffs into  the lungs 2 (two) times daily at 10 AM and 5 PM. 1 each 12   ipratropium-albuterol (DUONEB) 0.5-2.5 (3) MG/3ML SOLN Take 3 mLs by nebulization every 6 (six) hours as needed. 360 mL 0   spironolactone (ALDACTONE) 25 MG tablet Take 1 tablet (25 mg total) by mouth daily. NEEDS FOLLOW UP APPOINTMENT FOR ANYMORE REFILLS 90 tablet 0   warfarin (COUMADIN) 5 MG tablet TAKE 2 TABLETS BY MOUTH DAILY. EXCEPT 1 TABLET ON SUNDAY AND THURSDAY AS DIRECTED BY COAGULATION CLINIC 70 tablet 0   carvedilol (COREG) 25 MG tablet Take 1 tablet (25 mg total) by mouth 2 (two) times daily with a meal. 180 tablet 3   furosemide (LASIX) 20 MG tablet Take 1 tablet (20 mg total) by mouth daily. 180 tablet 3   potassium chloride SA (KLOR-CON M) 20 MEQ tablet Take 1 tablet (20 mEq total) by mouth daily. With Lasix 90 tablet 3   No current facility-administered medications for this encounter.    Allergies:   Other and Gluten meal   Social History:  The patient  reports that she has never smoked. She has never used smokeless tobacco. She reports current alcohol use. She reports that she does not use drugs.   Family History:  The patient's family history includes Diabetes in her maternal grandmother; Hypertension in her father, maternal grandfather, and mother.   ROS:  Please see the history of present illness.   All other systems are personally reviewed and negative.   Recent Labs: 02/15/2023: B Natriuretic Peptide 746.1; BUN 13; Creatinine, Ser 0.74; Hemoglobin 14.0; Platelets 276; Potassium 3.4; Sodium 139    Wt Readings from Last 3 Encounters:  02/15/23 97.3 kg (214 lb 9.6 oz)  11/26/21 100.7 kg (222 lb)  10/09/21 99.1 kg (218 lb 6.4 oz)    BP 136/86   Pulse 80   Wt 97.3 kg (214 lb 9.6 oz)   SpO2 98%   BMI 40.55 kg/m  General: NAD Neck: JVP 10-11 with HJR, no thyromegaly or thyroid nodule.  Lungs: Clear to auscultation bilaterally with normal respiratory effort. CV: Lateral PMI.  Heart regular S1/S2, no S3/S4,  no murmur.  No peripheral edema.  No carotid bruit.  Normal pedal pulses.  Abdomen: Soft, nontender, no hepatosplenomegaly, no distention.  Skin: Intact without lesions or rashes.  Neurologic: Alert and oriented x 3.  Psych: Normal affect. Extremities: No clubbing or cyanosis.  HEENT: Normal.   ASSESSMENT AND PLAN: 1. 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  RHC in 2/20 showed normal filling pressures and preserved cardiac output.  Cardiac MRI in 2/20 suggested that LAD territory was infarcted and unlikely to be viable with restoration of perfusion. Repeat Echo 02/2019, EF 20-25% RV was moderately reduced.  Echo 12/22 EF still 25-30% with peri-apical akinesis.  S/p [employer] ICD implant 2/23. NYHA class II symptoms but volume overloaded by exam and HeartLogic.  Not always compliant with meds, only taking Lasix about twice a week.   - Take Lasix 40 mg daily x 3 days then 20 mg daily after that (do not skip days).  Take 20 mEq KCL daily. BMET/BNP today and again in 10 days.  - Continue Entresto 97/103 bid.   - Continue Farxiga 10 mg daily.  - Continue spironolactone 25 mg daily.  - Increase Coreg to 25 mg bid.  - consider future addition of Bidil if BP can tolerate. - She needs to avoid pregnancy given teratogenic potentional of cardiac meds. She understands she need to contact HF clinic if she is gets pregnant so we can adjust HF medications. She would be high risk with pregnancy due to chronically low EF.  - I will arrange for repeat echo at followup with me in 3 months.  - As above, LAD territory is unlikely to be viable based on MRI, so would not pursue CTO procedure or CABG.  2. H/o LV thrombus: On warfarin.  No overt bleeding.  3. 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 No chest pain.  - On warfarin so no ASA.  - On high dose statin, check  lipids today. - LAD territory is unlikely to be viable based on MRI, so would not pursue CTO procedure or CABG. 4. Obesity: Body mass index is 40.55 kg/m. Working on weight loss.  - Insurance does not cover Ozempic or [id number].  She has a lot of anxiety around diagnosis of heart failure.  She has not been to the office in over a year. I stressed the need to come to regular appointments, get labs regularly, and take all meds as ordered.   Followup 3 wks with APP, 3 months with echo with me.   Signed, Marca Ancona, MD  02/16/2023  [facility] [place] [address] [facility] [place] [address] [phone] (office) [phone] (fax)

## 2023-02-24 ENCOUNTER — Other Ambulatory Visit (HOSPITAL_COMMUNITY): Payer: Self-pay | Admitting: Family Medicine

## 2023-02-25 ENCOUNTER — Other Ambulatory Visit (HOSPITAL_COMMUNITY): Payer: Medicaid Other

## 2023-02-28 ENCOUNTER — Ambulatory Visit (HOSPITAL_COMMUNITY)
Admission: RE | Admit: 2023-02-28 | Discharge: 2023-02-28 | Disposition: A | Discharge status: Home / Self Care | Payer: Medicaid Other | Source: Ambulatory Visit | Attending: Cardiology | Admitting: Cardiology

## 2023-02-28 DIAGNOSIS — I5022 Chronic systolic (congestive) heart failure: Secondary | ICD-10-CM | POA: Diagnosis not present

## 2023-02-28 LAB — BASIC METABOLIC PANEL
Anion gap: 9 (ref 5–15)
BUN: 14 mg/dL (ref 6–20)
CO2: 22 mmol/L (ref 22–32)
Calcium: 8.8 mg/dL — ABNORMAL LOW (ref 8.9–10.3)
Chloride: 107 mmol/L (ref 98–111)
Creatinine, Ser: 0.73 mg/dL (ref 0.44–1.00)
GFR, Estimated: >60 mL/min (ref >60)
Glucose, Bld: 93 mg/dL (ref 70–99)
Potassium: 3.8 mmol/L (ref 3.5–5.1)
Sodium: 138 mmol/L (ref 135–145)

## 2023-03-03 ENCOUNTER — Ambulatory Visit: Payer: Medicaid Other

## 2023-03-04 ENCOUNTER — Telehealth (HOSPITAL_COMMUNITY): Payer: Self-pay

## 2023-03-04 NOTE — Telephone Encounter (Signed)
Called to confirm/remind patient of their appointment at the [facility] on 03/08/23.   Patient reminded to bring all medications and/or complete list.  Confirmed patient has transportation. Gave directions, instructed to utilize valet parking.  Confirmed appointment prior to ending call.

## 2023-03-07 ENCOUNTER — Other Ambulatory Visit (HOSPITAL_COMMUNITY): Payer: Self-pay | Admitting: Family Medicine

## 2023-03-08 ENCOUNTER — Encounter (HOSPITAL_COMMUNITY): Payer: Medicaid Other

## 2023-03-10 ENCOUNTER — Encounter (HOSPITAL_COMMUNITY): Payer: Medicaid Other

## 2023-03-10 DIAGNOSIS — F411 Generalized anxiety disorder: Secondary | ICD-10-CM | POA: Diagnosis not present

## 2023-03-10 DIAGNOSIS — F4321 Adjustment disorder with depressed mood: Secondary | ICD-10-CM | POA: Diagnosis not present

## 2023-03-11 ENCOUNTER — Ambulatory Visit: Payer: Medicaid Other | Attending: Cardiology

## 2023-03-11 DIAGNOSIS — I236 Thrombosis of atrium, auricular appendage, and ventricle as current complications following acute myocardial infarction: Secondary | ICD-10-CM

## 2023-03-11 DIAGNOSIS — Z5181 Encounter for therapeutic drug level monitoring: Secondary | ICD-10-CM

## 2023-03-11 LAB — POCT INR: INR: 1.2 — AB (ref 2.0–3.0)

## 2023-03-11 MED ORDER — WARFARIN SODIUM 5 MG PO TABS
ORAL_TABLET | ORAL | 0 refills | Status: DC
Start: 2023-03-11 — End: 2023-04-22

## 2023-03-11 NOTE — Patient Instructions (Signed)
Description   BILL [id number]; Take 3 tablets today and 2.5 tablets tomorrow and then  resume taking Warfarin 2 tablets daily except for 1 tablet on Sundays and Thursdays.  Recheck INR in 2 weeks.  [facility] Clinic [phone].

## 2023-03-12 ENCOUNTER — Other Ambulatory Visit (HOSPITAL_COMMUNITY): Payer: Self-pay | Admitting: Cardiology

## 2023-03-12 DIAGNOSIS — I5022 Chronic systolic (congestive) heart failure: Secondary | ICD-10-CM

## 2023-03-17 DIAGNOSIS — F411 Generalized anxiety disorder: Secondary | ICD-10-CM | POA: Diagnosis not present

## 2023-03-17 DIAGNOSIS — F4321 Adjustment disorder with depressed mood: Secondary | ICD-10-CM | POA: Diagnosis not present

## 2023-03-23 ENCOUNTER — Ambulatory Visit (INDEPENDENT_AMBULATORY_CARE_PROVIDER_SITE_OTHER): Payer: Medicaid Other

## 2023-03-23 DIAGNOSIS — I255 Ischemic cardiomyopathy: Secondary | ICD-10-CM

## 2023-03-25 ENCOUNTER — Ambulatory Visit: Payer: Medicaid Other

## 2023-03-25 ENCOUNTER — Telehealth (HOSPITAL_COMMUNITY): Payer: Self-pay

## 2023-03-25 LAB — CUP PACEART REMOTE DEVICE CHECK
Battery Remaining Longevity: 174 mo
Battery Remaining Percentage: 100 %
Brady Statistic RV Percent Paced: 0 %
Date Time Interrogation Session: 20240919154300
HighPow Impedance: 69 Ohm
Implantable Lead Connection Status: 753985
Implantable Lead Implant Date: 20230222
Implantable Lead Location: 753860
Implantable Lead Model: 137
Implantable Lead Serial Number: 301096
Implantable Pulse Generator Implant Date: 20230222
Lead Channel Setting Pacing Amplitude: 2.5 V
Lead Channel Setting Pacing Pulse Width: 0.4 ms
Lead Channel Setting Sensing Sensitivity: 0.5 mV
Pulse Gen Serial Number: 215988

## 2023-03-25 NOTE — Telephone Encounter (Signed)
Called to confirm/remind patient of their appointment at the [facility] on 03/28/23.   Patient reminded to bring all medications and/or complete list.  Confirmed patient has transportation. Gave directions, instructed to utilize valet parking.  Confirmed appointment prior to ending call.

## 2023-03-28 ENCOUNTER — Ambulatory Visit (HOSPITAL_COMMUNITY)
Admission: RE | Admit: 2023-03-28 | Discharge: 2023-03-28 | Disposition: A | Discharge status: Home / Self Care | Payer: Medicaid Other | Source: Ambulatory Visit | Attending: Family Medicine | Admitting: Family Medicine

## 2023-03-28 ENCOUNTER — Encounter (HOSPITAL_COMMUNITY): Payer: Self-pay

## 2023-03-28 VITALS — BP 120/64 | HR 71 | Wt 211.8 lb

## 2023-03-28 DIAGNOSIS — J45909 Unspecified asthma, uncomplicated: Secondary | ICD-10-CM | POA: Diagnosis not present

## 2023-03-28 DIAGNOSIS — Z3169 Encounter for other general counseling and advice on procreation: Secondary | ICD-10-CM | POA: Diagnosis not present

## 2023-03-28 DIAGNOSIS — R0602 Shortness of breath: Secondary | ICD-10-CM | POA: Diagnosis not present

## 2023-03-28 DIAGNOSIS — Z86718 Personal history of other venous thrombosis and embolism: Secondary | ICD-10-CM | POA: Diagnosis not present

## 2023-03-28 DIAGNOSIS — I2510 Atherosclerotic heart disease of native coronary artery without angina pectoris: Secondary | ICD-10-CM | POA: Insufficient documentation

## 2023-03-28 DIAGNOSIS — R6881 Early satiety: Secondary | ICD-10-CM | POA: Insufficient documentation

## 2023-03-28 DIAGNOSIS — I255 Ischemic cardiomyopathy: Secondary | ICD-10-CM | POA: Diagnosis not present

## 2023-03-28 DIAGNOSIS — I5022 Chronic systolic (congestive) heart failure: Secondary | ICD-10-CM | POA: Insufficient documentation

## 2023-03-28 DIAGNOSIS — Z7901 Long term (current) use of anticoagulants: Secondary | ICD-10-CM | POA: Diagnosis not present

## 2023-03-28 DIAGNOSIS — I236 Thrombosis of atrium, auricular appendage, and ventricle as current complications following acute myocardial infarction: Secondary | ICD-10-CM

## 2023-03-28 DIAGNOSIS — Z79899 Other long term (current) drug therapy: Secondary | ICD-10-CM | POA: Insufficient documentation

## 2023-03-28 DIAGNOSIS — F419 Anxiety disorder, unspecified: Secondary | ICD-10-CM | POA: Insufficient documentation

## 2023-03-28 DIAGNOSIS — R140 Abdominal distension (gaseous): Secondary | ICD-10-CM | POA: Diagnosis not present

## 2023-03-28 DIAGNOSIS — Z7984 Long term (current) use of oral hypoglycemic drugs: Secondary | ICD-10-CM | POA: Insufficient documentation

## 2023-03-28 DIAGNOSIS — Z6841 Body Mass Index (BMI) 40.0 and over, adult: Secondary | ICD-10-CM | POA: Insufficient documentation

## 2023-03-28 DIAGNOSIS — E669 Obesity, unspecified: Secondary | ICD-10-CM | POA: Insufficient documentation

## 2023-03-28 NOTE — Patient Instructions (Signed)
Medication Changes:  No Changes In Medications at this time.   Testing/Procedures:  Your physician has requested that you have an echocardiogram IN 2 MONTHS AS SCHEDULED. Echocardiography is a painless test that uses sound waves to create images of your heart. It provides your doctor with information about the size and shape of your heart and how well your heart's chambers and valves are working. This procedure takes approximately one hour. There are no restrictions for this procedure. Please do NOT wear cologne, perfume, aftershave, or lotions (deodorant is allowed). Please arrive 15 minutes prior to your appointment time.  Special Instructions // Education:  PLEASE FOLLOW UP WITH YOUR PRIMARY CARE DOCTOR REGARDING YOUR GI ISSUES  Follow-Up in: 2 MONTHS WITH DR. Shirlee Latch AS SCHEDULED   At the [facility], you and your health needs are our priority. We have a designated team specialized in the treatment of Heart Failure. This Care Team includes your primary Heart Failure Specialized Cardiologist (physician), Advanced Practice Providers (APPs- Physician Assistants and Nurse Practitioners), and Pharmacist who all work together to provide you with the care you need, when you need it.   You may see any of the following providers on your designated Care Team at your next follow up:  Dr. Arvilla Meres Dr. Marca Ancona Dr. Marcos Eke, NP Robbie Lis, [place] [facility] [place], [place] Brynda Peon, NP Karle Plumber, PharmD   Please be sure to bring in all your medications bottles to every appointment.   Need to Contact us:  If you have any questions or concerns before your next appointment please send [place] a message through [place] or call our office at [phone].    TO LEAVE A MESSAGE FOR THE NURSE SELECT OPTION 2, PLEASE LEAVE A MESSAGE INCLUDING: YOUR NAME DATE OF BIRTH CALL BACK NUMBER REASON FOR CALL**this is important as we prioritize  the call backs  YOU WILL RECEIVE A CALL BACK THE SAME DAY AS LONG AS YOU CALL BEFORE 4:00 PM

## 2023-03-28 NOTE — Progress Notes (Signed)
Date:  03/28/2023   ID:  Sheila Estes, DOB [birth year], MRN [id number]   Provider location: [facility] Type of Visit: Established patient   PCP:  Grayce Sessions, NP  HF Cardiologist:  Dr. Shirlee Latch   History of Present Illness: Sheila Estes is a 32 y.o. with a history of history of obesity, anxiety, asthma, bipolar disorder, depression, and CAD, chronic systolic heart failure.  Admitted 2/23-09/04/18 with pulmonary edema. Sheila Estes had been short of breath for about a month, which started after drinking 2 Red Bulls. Sheila Estes had been seen at Urgent Care and [facility] and was treated for asthma and for PNA. Echo at 2/20 admission showed newly reduced EF 25-30% with peri-apical akinesis and apical thrombus. Transferred to [facility] and had LHC/RHC, which showed normal filling pressures and preserved cardiac output.  The proximal LAD was subtotally occluded, it looked like a SCAD event. Cardiac MRI suggested that LAD territory was infarcted and unlikely to be viable with restoration of perfusion. HF medications were optimized and Sheila Estes was set up with LifeVest for discharge. Sheila Estes was started on coumadin for LV thrombus. DC weight: 198 lbs.  Seen in the [facility] 02/2019. At that time Sheila Estes had self discontinued her Lifevest and had returned it. Echo was repeated at visit and EF remained severely reduced at 20-25% and RV was moderately reduced.   On coumadin for LV thrombus. INRs followed by [facility] coumadin clinic.   Echo 12/22 EF 25-30%, peri-apical akinesis, no LV thrombus, mildly decreased RV systolic function, normal IVC.   S/p [employer] ICD 2/23.  Follow up 8/24, had not been seen for > 1 year. NYHA II but volume overloaded. Lasix increased to daily and Coreg increased.   Today Sheila Estes returns for HF follow up. Overall feeling fine. Sheila Estes is taking her Lasix every other day or every 2-3 days due to work obligations (works as a Lawyer in [employer]). Main complaint is  abdominal fullness/bloating after eating, when it gets bad Sheila Estes will take a Lasix. Sheila Estes has SOB if Sheila Estes has to run fast, but otherwise can climb 3 flights to her apartment without dyspnea. Denies palpitations, CP, dizziness, edema, or PND/Orthopnea. Appetite ok. No fever or chills. Sheila Estes does not check her weight at home, but usually 208-212 lbs. No tobacco, drinks ETOH once a week, no drugs.  [employer] ICD interrogation (personally reviewed from 03/23/23): HeartLogic Score 0, stable thoracic impedence, average HR 68 bpm, 2.4 hr/day activity, no VT  ECG (personally reviewed): none ordered today  Labs (3/20): K 3.9, creatinine 0.79 Labs (8/21): K 3.6 creatinine 0.71 Labs (10/21): K 4, creatinine 0.62, LDL 57 Labs (8/22): K 3.8, creatinine 0.7 Labs (2/23): K 4.3, creatinine 0.82 Labs (4/23): K 4.1, creatinine 0.78, LDL 74 Labs (8/24): K 3.8, creatinine 0.73, LDL 89  PMH: 1. Depression 2. Anxiety 3. Asthma 4. Chronic systolic CHF: Ischemic cardiomyopathy.   - Echo (2/20) with EF 25-30%, peri-apical akinesis, LV thrombus.  - LHC/RHC (2/20): Subtotal LAD occlusion after S1.  Appearance of LAD suggests SCAD.  Mean RA 1, PA 36/8 mean 23, mean PCWP 12, CI 2.46.  - Cardiac MRI (2/20): Mildly dilated LV with EF 27%, delayed enhancement images suggest a dense LAD-territory infarct (myocardium unlikely to be viable), LV apical thrombus, RV EF 31%.  - Echo (12/22): EF 25-30%, peri-apical akinesis, no LV thrombus, mildly decreased RV systolic function, normal IVC.  - s/p [employer] ICD 2/23. 5. CAD: LHC (  2/20) with subtotal LAD occlusion after S1.  Appearance of LAD suggests SCAD.  Current Outpatient Medications  Medication Sig Dispense Refill   albuterol (VENTOLIN HFA) 108 (90 Base) MCG/ACT inhaler Inhale 2 puffs into the lungs every 6 (six) hours as needed for wheezing or shortness of breath. 1 each 2   atorvastatin (LIPITOR) 80 MG tablet TAKE 1 TABLET(80 MG) BY MOUTH DAILY AT 6 PM 90  tablet 0   carvedilol (COREG) 25 MG tablet Take 1 tablet (25 mg total) by mouth 2 (two) times daily with a meal. 180 tablet 3   dapagliflozin propanediol (FARXIGA) 10 MG TABS tablet TAKE 1 TABLET(10 MG) BY MOUTH DAILY BEFORE BREAKFAST 90 tablet 3   ENTRESTO 97-103 MG TAKE 1 TABLET BY MOUTH TWICE DAILY 60 tablet 11   fluticasone (FLOVENT HFA) 110 MCG/ACT inhaler Inhale 2 puffs into the lungs 2 (two) times daily at 10 AM and 5 PM. 1 each 12   furosemide (LASIX) 20 MG tablet Take 1 tablet (20 mg total) by mouth daily. 180 tablet 3   ipratropium-albuterol (DUONEB) 0.5-2.5 (3) MG/3ML SOLN Take 3 mLs by nebulization every 6 (six) hours as needed. 360 mL 0   potassium chloride SA (KLOR-CON M) 20 MEQ tablet Take 1 tablet (20 mEq total) by mouth daily. With Lasix 90 tablet 3   spironolactone (ALDACTONE) 25 MG tablet TAKE 1 TABLET(25 MG) BY MOUTH DAILY. NEED FOLLOW UP APPOINTMENT FOR ANYMORE REFILLS 90 tablet 0   warfarin (COUMADIN) 5 MG tablet TAKE 2 TABLETS BY MOUTH DAILY. EXCEPT 1 TABLET ON SUNDAY AND THURSDAY AS DIRECTED BY COAGULATION CLINIC 55 tablet 0   No current facility-administered medications for this encounter.    Allergies:   Other and Gluten meal   Social History:  The patient  reports that Sheila Estes has never smoked. Sheila Estes has never used smokeless tobacco. Sheila Estes reports current alcohol use. Sheila Estes reports that Sheila Estes does not use drugs.   Family History:  The patient's family history includes Diabetes in her maternal grandmother; Hypertension in her father, maternal grandfather, and mother.   ROS:  Please see the history of present illness.   All other systems are personally reviewed and negative.   Recent Labs: 02/15/2023: B Natriuretic Peptide 746.1; Hemoglobin 14.0; Platelets 276 02/28/2023: BUN 14; Creatinine, Ser 0.73; Potassium 3.8; Sodium 138    Wt Readings from Last 3 Encounters:  03/28/23 96.1 kg (211 lb 12.8 oz)  02/15/23 97.3 kg (214 lb 9.6 oz)  11/26/21 100.7 kg (222 lb)    BP 120/64    Pulse 71   Wt 96.1 kg (211 lb 12.8 oz)   SpO2 95%   BMI 40.02 kg/m  PHYSICAL EXAM General:  NAD. No resp difficulty, walked into clinic HEENT: Normal Neck: Supple. No JVD. Carotids 2+ bilat; no bruits. No lymphadenopathy or thryomegaly appreciated. Cor: PMI nondisplaced. Regular rate & rhythm. No rubs, gallops or murmurs. Lungs: Clear Abdomen: Soft, obese, nontender, nondistended. No hepatosplenomegaly. No bruits or masses. Good bowel sounds. Extremities: No cyanosis, clubbing, rash, edema Neuro: Alert & oriented x 3, cranial nerves grossly intact. Moves all 4 extremities w/o difficulty. Affect pleasant.  ASSESSMENT AND PLAN: 1. 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  RHC in 2/20 showed normal filling pressures and preserved cardiac output.  Cardiac MRI in 2/20 suggested that LAD territory was infarcted and unlikely to be viable with restoration of perfusion. Repeat Echo  02/2019, EF 20-25% RV was moderately reduced.  Echo 12/22 EF still 25-30% with peri-apical akinesis.  S/p [employer] ICD implant 2/23. NYHA class I-II symptoms, Sheila Estes is not volume overloaded by exam or HeartLogic, weight down 3 lbs.    - Continue Lasix 20 mg/20 KCL every other day. Discussed PRN use. Recent labs reviewed and are stable, K 3.8, creatinine 0.73. - Continue Entresto 97/103 bid.   - Continue Farxiga 10 mg daily.  - Continue spironolactone 25 mg daily.  - Continue Coreg 25 mg bid.  - Consider future addition of Bidil if BP can tolerate. - Sheila Estes needs to avoid pregnancy given teratogenic potentional of cardiac meds. Sheila Estes is not using contraception. Sheila Estes understands Sheila Estes need to contact HF clinic if Sheila Estes is gets pregnant so we can adjust HF medications. Sheila Estes would be high risk with pregnancy due to chronically low EF. (See #5) - Repeat echo next visit - As above, LAD territory is unlikely to be viable based  on MRI, so would not pursue CTO procedure or CABG.  2. H/o LV thrombus: On warfarin.  No overt bleeding.  3. 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 No chest pain.  - On warfarin, so no ASA.  - On high dose statin, good lipids 8/24. - LAD territory is unlikely to be viable based on MRI, so would not pursue CTO procedure or CABG. 4. Obesity: Body mass index is 40.02 kg/m.  Working on weight loss.  - Insurance does not cover Ozempic or [id number]. 5. Pre-conception counseling: Sheila Estes is asking "what would happen if I get pregnant." CARPREG II score at least 7 (41% risk of maternal-fetal complications). We discussed this today. Sheila Estes needs to establish with an OB/GYN.  6. Early satiety: sounds like fluid vs gastroparesis. Needs follow up with PCP, +/- GI referral.  Sheila Estes has a lot of anxiety around diagnosis of heart failure.  Sheila Estes has not been to the office in over a year. I stressed the need to come to regular appointments, get labs regularly, and take all meds as ordered.   Follow up in 2 months with Dr. Shirlee Latch + echo.  Signed, Jacklynn Ganong, FNP  03/28/2023  [facility] [address] [facility] [place] [address] [phone] (office) [phone] (fax)

## 2023-03-29 NOTE — Progress Notes (Signed)
Remote ICD transmission.   

## 2023-03-31 ENCOUNTER — Ambulatory Visit: Payer: Medicaid Other | Attending: Cardiology

## 2023-03-31 DIAGNOSIS — Z5181 Encounter for therapeutic drug level monitoring: Secondary | ICD-10-CM

## 2023-03-31 DIAGNOSIS — I236 Thrombosis of atrium, auricular appendage, and ventricle as current complications following acute myocardial infarction: Secondary | ICD-10-CM | POA: Diagnosis not present

## 2023-03-31 LAB — POCT INR: INR: 3.6 — AB (ref 2.0–3.0)

## 2023-03-31 NOTE — Patient Instructions (Signed)
Description   BILL 40981; Only take 1/2 tablet today and then resume taking Warfarin 2 tablets daily except for 1 tablet on Sundays and Thursdays.  Recheck INR in 2 weeks.  [facility] Clinic [phone]

## 2023-04-07 NOTE — Progress Notes (Signed)
Remote ICD transmission.   

## 2023-04-15 ENCOUNTER — Ambulatory Visit: Payer: Medicaid Other

## 2023-04-19 ENCOUNTER — Encounter: Payer: Self-pay | Admitting: Cardiology

## 2023-04-22 ENCOUNTER — Ambulatory Visit: Payer: Medicaid Other | Attending: Internal Medicine

## 2023-04-22 ENCOUNTER — Other Ambulatory Visit: Payer: Self-pay

## 2023-04-22 DIAGNOSIS — Z5181 Encounter for therapeutic drug level monitoring: Secondary | ICD-10-CM

## 2023-04-22 DIAGNOSIS — I236 Thrombosis of atrium, auricular appendage, and ventricle as current complications following acute myocardial infarction: Secondary | ICD-10-CM | POA: Diagnosis not present

## 2023-04-22 LAB — POCT INR: INR: 1.3 — AB (ref 2.0–3.0)

## 2023-04-22 MED ORDER — WARFARIN SODIUM 5 MG PO TABS
ORAL_TABLET | ORAL | 0 refills | Status: DC
Start: 2023-04-22 — End: 2023-04-22

## 2023-04-22 MED ORDER — WARFARIN SODIUM 5 MG PO TABS
ORAL_TABLET | ORAL | 0 refills | Status: DC
Start: 2023-04-22 — End: 2023-06-23

## 2023-04-22 NOTE — Patient Instructions (Signed)
Take 3 tablets today and Saturday then  resume taking Warfarin 2 tablets daily except for 1 tablet on Sundays and Thursdays.  Recheck INR in 2 weeks.  C[facility]in Clinic [phone]

## 2023-05-06 ENCOUNTER — Ambulatory Visit: Payer: Medicaid Other

## 2023-05-11 ENCOUNTER — Ambulatory Visit: Payer: Medicaid Other | Attending: Cardiovascular Disease

## 2023-05-11 ENCOUNTER — Telehealth: Payer: Self-pay | Admitting: *Deleted

## 2023-05-11 NOTE — Telephone Encounter (Signed)
Called pt since she missed her Anticoagulation Appt today at 345pm, there was no answer and no voicemail set up.   Last INR done on 04/22/23 at INR was 1.3 and was due on 05/06/23

## 2023-06-05 ENCOUNTER — Other Ambulatory Visit (HOSPITAL_COMMUNITY): Payer: Self-pay | Admitting: Cardiology

## 2023-06-09 DIAGNOSIS — F411 Generalized anxiety disorder: Secondary | ICD-10-CM | POA: Diagnosis not present

## 2023-06-09 DIAGNOSIS — F4321 Adjustment disorder with depressed mood: Secondary | ICD-10-CM | POA: Diagnosis not present

## 2023-06-14 ENCOUNTER — Other Ambulatory Visit (HOSPITAL_COMMUNITY): Payer: Self-pay

## 2023-06-14 DIAGNOSIS — I5022 Chronic systolic (congestive) heart failure: Secondary | ICD-10-CM

## 2023-06-14 MED ORDER — ATORVASTATIN CALCIUM 80 MG PO TABS: ORAL_TABLET | ORAL | 1 refills | Status: DC

## 2023-06-22 ENCOUNTER — Ambulatory Visit (INDEPENDENT_AMBULATORY_CARE_PROVIDER_SITE_OTHER): Payer: Medicaid Other

## 2023-06-22 DIAGNOSIS — I5022 Chronic systolic (congestive) heart failure: Secondary | ICD-10-CM | POA: Diagnosis not present

## 2023-06-22 LAB — CUP PACEART REMOTE DEVICE CHECK
Battery Remaining Longevity: 174 mo
Battery Remaining Percentage: 100 %
Brady Statistic RV Percent Paced: 0 %
Date Time Interrogation Session: 20241218104600
HighPow Impedance: 66 Ohm
Implantable Lead Connection Status: 753985
Implantable Lead Implant Date: 20230222
Implantable Lead Location: 753860
Implantable Lead Model: 137
Implantable Lead Serial Number: 301096
Implantable Pulse Generator Implant Date: 20230222
Lead Channel Setting Pacing Amplitude: 2.5 V
Lead Channel Setting Pacing Pulse Width: 0.4 ms
Lead Channel Setting Sensing Sensitivity: 0.5 mV
Pulse Gen Serial Number: 215988

## 2023-06-23 ENCOUNTER — Encounter (HOSPITAL_COMMUNITY): Payer: Self-pay | Admitting: Cardiology

## 2023-06-23 ENCOUNTER — Ambulatory Visit (HOSPITAL_BASED_OUTPATIENT_CLINIC_OR_DEPARTMENT_OTHER)
Admission: RE | Admit: 2023-06-23 | Discharge: 2023-06-23 | Disposition: A | Discharge status: Home / Self Care | Payer: Medicaid Other | Source: Ambulatory Visit | Attending: Cardiology | Admitting: Cardiology

## 2023-06-23 ENCOUNTER — Other Ambulatory Visit (HOSPITAL_COMMUNITY): Payer: Self-pay

## 2023-06-23 ENCOUNTER — Telehealth (HOSPITAL_COMMUNITY): Payer: Self-pay | Admitting: *Deleted

## 2023-06-23 ENCOUNTER — Ambulatory Visit (HOSPITAL_COMMUNITY)
Admission: RE | Admit: 2023-06-23 | Discharge: 2023-06-23 | Disposition: A | Discharge status: Home / Self Care | Payer: Medicaid Other | Source: Ambulatory Visit | Attending: Primary Care | Admitting: Primary Care

## 2023-06-23 VITALS — BP 110/70 | HR 66 | Wt 217.0 lb

## 2023-06-23 DIAGNOSIS — Z7901 Long term (current) use of anticoagulants: Secondary | ICD-10-CM

## 2023-06-23 DIAGNOSIS — I236 Thrombosis of atrium, auricular appendage, and ventricle as current complications following acute myocardial infarction: Secondary | ICD-10-CM | POA: Insufficient documentation

## 2023-06-23 DIAGNOSIS — Z7984 Long term (current) use of oral hypoglycemic drugs: Secondary | ICD-10-CM | POA: Diagnosis not present

## 2023-06-23 DIAGNOSIS — Z5181 Encounter for therapeutic drug level monitoring: Secondary | ICD-10-CM | POA: Diagnosis not present

## 2023-06-23 DIAGNOSIS — I2510 Atherosclerotic heart disease of native coronary artery without angina pectoris: Secondary | ICD-10-CM | POA: Insufficient documentation

## 2023-06-23 DIAGNOSIS — I5022 Chronic systolic (congestive) heart failure: Secondary | ICD-10-CM

## 2023-06-23 DIAGNOSIS — E669 Obesity, unspecified: Secondary | ICD-10-CM | POA: Insufficient documentation

## 2023-06-23 DIAGNOSIS — J45909 Unspecified asthma, uncomplicated: Secondary | ICD-10-CM | POA: Diagnosis not present

## 2023-06-23 DIAGNOSIS — I255 Ischemic cardiomyopathy: Secondary | ICD-10-CM | POA: Insufficient documentation

## 2023-06-23 DIAGNOSIS — Z6841 Body Mass Index (BMI) 40.0 and over, adult: Secondary | ICD-10-CM | POA: Diagnosis not present

## 2023-06-23 DIAGNOSIS — Z006 Encounter for examination for normal comparison and control in clinical research program: Secondary | ICD-10-CM

## 2023-06-23 DIAGNOSIS — Z79899 Other long term (current) drug therapy: Secondary | ICD-10-CM | POA: Insufficient documentation

## 2023-06-23 LAB — BASIC METABOLIC PANEL
Anion gap: 6 (ref 5–15)
BUN: 14 mg/dL (ref 6–20)
CO2: 26 mmol/L (ref 22–32)
Calcium: 9.6 mg/dL (ref 8.9–10.3)
Chloride: 106 mmol/L (ref 98–111)
Creatinine, Ser: 0.77 mg/dL (ref 0.44–1.00)
GFR, Estimated: >60 mL/min (ref >60)
Glucose, Bld: 85 mg/dL (ref 70–99)
Potassium: 3.8 mmol/L (ref 3.5–5.1)
Sodium: 138 mmol/L (ref 135–145)

## 2023-06-23 LAB — PROTIME-INR
INR: 1.7 — ABNORMAL HIGH (ref 0.8–1.2)
Prothrombin Time: 19.8 s — ABNORMAL HIGH (ref 11.4–15.2)

## 2023-06-23 LAB — ECHOCARDIOGRAM COMPLETE
AR max vel: 1.6 cm2
AV Area VTI: 1.51 cm2
AV Area mean vel: 1.54 cm2
AV Mean grad: 4 mm[Hg]
AV Peak grad: 7.2 mm[Hg]
Ao pk vel: 1.34 m/s
Area-P 1/2: 6.37 cm2
Calc EF: 28.4 %
S' Lateral: 4.8 cm
Single Plane A2C EF: 39.4 %
Single Plane A4C EF: 19.4 %

## 2023-06-23 LAB — BRAIN NATRIURETIC PEPTIDE: B Natriuretic Peptide: 228.3 pg/mL — ABNORMAL HIGH (ref 0.0–100.0)

## 2023-06-23 MED ORDER — APIXABAN 5 MG PO TABS: 5.0000 mg | ORAL_TABLET | Freq: Two times a day (BID) | ORAL | 6 refills | Status: DC

## 2023-06-23 MED ORDER — BIDIL 20-37.5 MG PO TABS: 0.5000 | ORAL_TABLET | Freq: Three times a day (TID) | ORAL | 3 refills | Status: DC

## 2023-06-23 MED ORDER — PERFLUTREN LIPID MICROSPHERE
1.0000 mL | INTRAVENOUS | Status: DC | PRN
  Administered 2023-06-23: 3 mL via INTRAVENOUS
  Filled 2023-06-23: qty 10

## 2023-06-23 MED ORDER — ROSUVASTATIN CALCIUM 40 MG PO TABS
40.0000 mg | ORAL_TABLET | Freq: Every day | ORAL | 3 refills | Status: AC
Start: 2023-06-23 — End: 2025-01-10

## 2023-06-23 NOTE — Telephone Encounter (Signed)
Pt aware labs normal, ok to start Eliquis, she is agreeable.

## 2023-06-23 NOTE — Research (Signed)
SITE: [id number]     Subject # [id number]   Subprotocol: A  Inclusion Criteria  Patients who meet all of the following criteria are eligible for enrollment as study participants:  Yes No  Age > 34 years old X   Eligible to wear Holter Study X    Exclusion Criteria  Patients who meet any of these criteria are not eligible for enrollment as study participants: Yes No  1. Receiving any mechanical (respiratory or circulatory) or renal support therapy at Screening or during Visit #1.  X  2.  Any other conditions that in the opinion of the investigators are likely to prevent compliance with the study protocol or pose a safety concern if the subject participates in the study.  X  3. Poor tolerance, namely susceptible to severe skin allergies from ECG adhesive patch application.  X   Protocol: REV H                                     Residential Zip [address] (First 3 digits ONLY)                                             [facility] Informed Consent   Subject Name: Sheila Estes  Subject met inclusion and exclusion criteria.  The informed consent form, study requirements and expectations were reviewed with the subject. Subject had opportunity to read consent and questions and concerns were addressed prior to the signing of the consent form.  The subject verbalized understanding of the trial requirements.  The subject agreed to participate in the [employer] EF ACT trial and signed the informed consent at 11:22 on 23-Jun-2023.  The informed consent was obtained prior to performance of any protocol-specific procedures for the subject.  A copy of the signed informed consent was given to the subject and a copy was placed in the subject's medical record.   Dyanne Iha          Current Outpatient Medications:    albuterol (VENTOLIN HFA) 108 (90 Base) MCG/ACT inhaler, Inhale 2 puffs into the lungs every 6 (six) hours as needed for wheezing or shortness of breath., Disp: 1 each, Rfl: 2   apixaban  (ELIQUIS) 5 MG TABS tablet, Take 1 tablet (5 mg total) by mouth 2 (two) times daily., Disp: 60 tablet, Rfl: 6   atorvastatin (LIPITOR) 80 MG tablet, TAKE 1 TABLET(80 MG) BY MOUTH DAILY AT 6 PM, Disp: 90 tablet, Rfl: 1   BIDIL 20-37.5 MG tablet, Take 0.5 tablets by mouth 3 (three) times daily., Disp: 45 tablet, Rfl: 3   carvedilol (COREG) 25 MG tablet, Take 1 tablet (25 mg total) by mouth 2 (two) times daily with a meal., Disp: 180 tablet, Rfl: 3   dapagliflozin propanediol (FARXIGA) 10 MG TABS tablet, TAKE 1 TABLET(10 MG) BY MOUTH DAILY BEFORE BREAKFAST, Disp: 90 tablet, Rfl: 3   ENTRESTO 97-103 MG, TAKE 1 TABLET BY MOUTH TWICE DAILY, Disp: 60 tablet, Rfl: 11   fluticasone (FLOVENT HFA) 110 MCG/ACT inhaler, Inhale 2 puffs into the lungs 2 (two) times daily at 10 AM and 5 PM., Disp: 1 each, Rfl: 12   furosemide (LASIX) 20 MG tablet, Take 1 tablet (20 mg total) by mouth daily., Disp: 180 tablet, Rfl: 3   ipratropium-albuterol (DUONEB) 0.5-2.5 (  3) MG/3ML SOLN, Take 3 mLs by nebulization every 6 (six) hours as needed., Disp: 360 mL, Rfl: 0   potassium chloride SA (KLOR-CON M) 20 MEQ tablet, Take 1 tablet (20 mEq total) by mouth daily. With Lasix, Disp: 90 tablet, Rfl: 3   rosuvastatin (CRESTOR) 40 MG tablet, Take 1 tablet (40 mg total) by mouth daily., Disp: 90 tablet, Rfl: 3   spironolactone (ALDACTONE) 25 MG tablet, Take 1 tablet (25 mg total) by mouth daily., Disp: 90 tablet, Rfl: 3 No current facility-administered medications for this visit.  Facility-Administered Medications Ordered in Other Visits:    perflutren lipid microspheres (DEFINITY) IV suspension, 1-10 mL, Intravenous, PRN, Jacklynn Ganong, FNP, 3 mL at 06/23/23 1124

## 2023-06-23 NOTE — Patient Instructions (Signed)
Medication Changes:  STOP Warfarin (Coumadin)  STOP Atorvastatin  START Bidil 1/2 tab Three times a day   START Eliquis 5 mg Twice daily (**DO NOT START UNTIL WE ADVISE YOU TO)  Lab Work:  Labs done today, your results will be available in MyChart, we will contact you for abnormal readings.  Your physician recommends that you return for a FASTING lipid profile: 2 months  Testing/Procedures:  none  Referrals:  You have been referred to Cardiac Rehab, they will call you to schedule  Special Instructions // Education:  Do the following things EVERYDAY: Weigh yourself in the morning before breakfast. Write it down and keep it in a log. Take your medicines as prescribed Eat low salt foods--Limit salt (sodium) to 2000 mg per day.  Stay as active as you can everyday Limit all fluids for the day to less than 2 liters   Follow-Up in: 3 months   At the [facility], you and your health needs are our priority. We have a designated team specialized in the treatment of Heart Failure. This Care Team includes your primary Heart Failure Specialized Cardiologist (physician), Advanced Practice Providers (APPs- Physician Assistants and Nurse Practitioners), and Pharmacist who all work together to provide you with the care you need, when you need it.   You may see any of the following providers on your designated Care Team at your next follow up:  Dr. Arvilla Meres Dr. Marca Ancona Dr. Dorthula Nettles Dr. Theresia Bough Tonye Becket, NP Robbie Lis, [place] [facility] [place], [place] Brynda Peon, NP [place] Lee, NP Karle Plumber, PharmD   Please be sure to bring in all your medications bottles to every appointment.   Need to Contact us:  If you have any questions or concerns before your next appointment please send [place] a message through [place] or call our office at [phone].    TO LEAVE A MESSAGE FOR THE NURSE SELECT OPTION 2, PLEASE LEAVE A MESSAGE  INCLUDING: YOUR NAME DATE OF BIRTH CALL BACK NUMBER REASON FOR CALL**this is important as we prioritize the call backs  YOU WILL RECEIVE A CALL BACK THE SAME DAY AS LONG AS YOU CALL BEFORE 4:00 PM

## 2023-06-24 NOTE — Progress Notes (Signed)
Date:  06/24/2023   ID:  Sheila Estes, DOB Dec 23, 1990, MRN [id number]   Provider location: [facility] Type of Visit: Established patient   PCP:  Grayce Sessions, NP  HF Cardiologist:  Dr. Shirlee Latch   History of Present Illness: Ms Tokunaga is a 32 y.o. with a history of history of obesity, anxiety, asthma, bipolar disorder, depression, and CAD, chronic systolic heart failure.  Admitted 2/23-09/04/18 with pulmonary edema. She had been short of breath for about a month, which started after drinking 2 Red Bulls. She had been seen at Urgent [facility] and [facility] and was treated for asthma and for PNA. Echo at 2/20 admission showed newly reduced EF 25-30% with peri-apical akinesis and apical thrombus. Transferred to [facility] and had LHC/RHC, which showed normal filling pressures and preserved cardiac output.  The proximal LAD was subtotally occluded, it looked like a SCAD event. Cardiac MRI suggested that LAD territory was infarcted and unlikely to be viable with restoration of perfusion. HF medications were optimized and she was set up with LifeVest for discharge. She was started on coumadin for LV thrombus. DC weight: 198 lbs.  Seen in the [facility] 02/2019. At that time she had self discontinued her Lifevest and had returned it. Echo was repeated at visit and EF remained severely reduced at 20-25% and RV was moderately reduced.   On coumadin for LV thrombus. INRs followed by [facility] coumadin clinic.   Echo 12/22 EF 25-30%, peri-apical akinesis, no LV thrombus, mildly decreased RV systolic function, normal IVC.   S/p [employer] ICD 2/23.  Echo was done today and reviewed, EF 20-25%, moderate LV dilation, no LV thrombus, normal RV, moderate PI, IVC normal.   Today she returns for HF follow up. Weight is up 6 lbs.  She is not taking her Lasix every day.  No dyspnea walking on flat ground. She can walk up 2 flights of stairs to get to her apartment. She works as a  Lawyer.  No chest pain.  No lightheadedness.  No BRBPR/melena.   [employer] ICD interrogation (personally reviewed from 03/23/23): HeartLogic Score 0, stable thoracic impedence, average HR 68 bpm, 2.4 hr/day activity, no VT  ECG (personally reviewed): NSR, LAFB, poor RWP, PVCs, lateral Qs  Labs (3/20): K 3.9, creatinine 0.79 Labs (8/21): K 3.6 creatinine 0.71 Labs (10/21): K 4, creatinine 0.62, LDL 57 Labs (8/22): K 3.8, creatinine 0.7 Labs (2/23): K 4.3, creatinine 0.82 Labs (4/23): K 4.1, creatinine 0.78, LDL 74 Labs (8/24): K 3.8, creatinine 0.73, LDL 89, BNP 746  PMH: 1. Depression 2. Anxiety 3. Asthma 4. Chronic systolic CHF: Ischemic cardiomyopathy.   - Echo (2/20) with EF 25-30%, peri-apical akinesis, LV thrombus.  - LHC/RHC (2/20): Subtotal LAD occlusion after S1.  Appearance of LAD suggests SCAD.  Mean RA 1, PA 36/8 mean 23, mean PCWP 12, CI 2.46.  - Cardiac MRI (2/20): Mildly dilated LV with EF 27%, delayed enhancement images suggest a dense LAD-territory infarct (myocardium unlikely to be viable), LV apical thrombus, RV EF 31%.  - Echo (12/22): EF 25-30%, peri-apical akinesis, no LV thrombus, mildly decreased RV systolic function, normal IVC.  - s/p [employer] ICD 2/23. - Echo (12/24): EF 20-25%, moderate LV dilation, no LV thrombus, normal RV, moderate PI, IVC normal. 5. CAD: LHC (2/20) with subtotal LAD occlusion after S1.  Appearance of LAD suggests SCAD.  Current Outpatient Medications  Medication Sig Dispense Refill   albuterol (VENTOLIN  HFA) 108 (90 Base) MCG/ACT inhaler Inhale 2 puffs into the lungs every 6 (six) hours as needed for wheezing or shortness of breath. 1 each 2   apixaban (ELIQUIS) 5 MG TABS tablet Take 1 tablet (5 mg total) by mouth 2 (two) times daily. 60 tablet 6   atorvastatin (LIPITOR) 80 MG tablet TAKE 1 TABLET(80 MG) BY MOUTH DAILY AT 6 PM 90 tablet 1   BIDIL 20-37.5 MG tablet Take 0.5 tablets by mouth 3 (three) times  daily. 45 tablet 3   carvedilol (COREG) 25 MG tablet Take 1 tablet (25 mg total) by mouth 2 (two) times daily with a meal. 180 tablet 3   dapagliflozin propanediol (FARXIGA) 10 MG TABS tablet TAKE 1 TABLET(10 MG) BY MOUTH DAILY BEFORE BREAKFAST 90 tablet 3   ENTRESTO 97-103 MG TAKE 1 TABLET BY MOUTH TWICE DAILY 60 tablet 11   fluticasone (FLOVENT HFA) 110 MCG/ACT inhaler Inhale 2 puffs into the lungs 2 (two) times daily at 10 AM and 5 PM. 1 each 12   furosemide (LASIX) 20 MG tablet Take 1 tablet (20 mg total) by mouth daily. 180 tablet 3   ipratropium-albuterol (DUONEB) 0.5-2.5 (3) MG/3ML SOLN Take 3 mLs by nebulization every 6 (six) hours as needed. 360 mL 0   potassium chloride SA (KLOR-CON M) 20 MEQ tablet Take 1 tablet (20 mEq total) by mouth daily. With Lasix 90 tablet 3   rosuvastatin (CRESTOR) 40 MG tablet Take 1 tablet (40 mg total) by mouth daily. 90 tablet 3   spironolactone (ALDACTONE) 25 MG tablet Take 1 tablet (25 mg total) by mouth daily. 90 tablet 3   No current facility-administered medications for this encounter.    Allergies:   Other and Gluten meal   Social History:  The patient  reports that she has never smoked. She has never used smokeless tobacco. She reports current alcohol use. She reports that she does not use drugs.   Family History:  The patient's family history includes Diabetes in her maternal grandmother; Hypertension in her father, maternal grandfather, and mother.   ROS:  Please see the history of present illness.   All other systems are personally reviewed and negative.   Recent Labs: 02/15/2023: Hemoglobin 14.0; Platelets 276 06/23/2023: B Natriuretic Peptide 228.3; BUN 14; Creatinine, Ser 0.77; Potassium 3.8; Sodium 138    Wt Readings from Last 3 Encounters:  06/23/23 98.4 kg (217 lb)  03/28/23 96.1 kg (211 lb 12.8 oz)  02/15/23 97.3 kg (214 lb 9.6 oz)    BP 110/70   Pulse 66   Wt 98.4 kg (217 lb)   SpO2 97%   BMI 41.00 kg/m  General: NAD Neck:  JVP 8 cm, no thyromegaly or thyroid nodule.  Lungs: Clear to auscultation bilaterally with normal respiratory effort. CV: Nondisplaced PMI.  Heart regular S1/S2, no S3/S4, no murmur.  No peripheral edema.  No carotid bruit.  Normal pedal pulses.  Abdomen: Soft, nontender, no hepatosplenomegaly, no distention.  Skin: Intact without lesions or rashes.  Neurologic: Alert and oriented x 3.  Psych: Normal affect. Extremities: No clubbing or cyanosis.  HEENT: Normal.   ASSESSMENT AND PLAN: 1. 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  RHC in 2/20 showed normal filling pressures and preserved cardiac output.  Cardiac MRI in 2/20 suggested that LAD territory was infarcted and unlikely to be viable with restoration of perfusion. Repeat Echo 02/2019,  EF 20-25% RV was moderately reduced.  Echo 12/22 EF still 25-30% with peri-apical akinesis.  S/p [employer] ICD implant 2/23.  Echo today showed  EF 20-25%, moderate LV dilation, no LV thrombus, normal RV, moderate PI, IVC normal.  NYHA class I-II symptoms.  Weight is up and she is mildly volume overloaded.  - Not taking Lasix regularly.  Take Lasix 20 mg daily.  BMET/BNP.     - Continue Entresto 97/103 bid.   - Continue Farxiga 10 mg daily.  - Continue spironolactone 25 mg daily.  - Continue Coreg 25 mg bid.  - Add Bidil 1/2 tab tid.  - She needs to avoid pregnancy given teratogenic potentional of cardiac meds. She is not using contraception. She understands she need to contact HF clinic if she is gets pregnant so we can adjust HF medications. She would be high risk with pregnancy due to chronically low EF.  - LAD territory is unlikely to be viable based on MRI, so would not pursue CTO procedure or CABG.  - Refer to cardiac rehab.  - Eventual CPX when we have an exercise tech.  - She may eventually need advanced therapies.  2. H/o LV  thrombus: On warfarin.  No overt bleeding.  No thrombus seen on echo today.  - She would like to switch to Eliquis. I think this is reasonable.  I will have our pharmacist arrange the change.  3. 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 No chest pain.  - On warfarin, so no ASA.  - Goal LDL < 70, stop atorvastatin and start Crestor 40 mg daily.  Lipids/LFTs in 2 months.  - LAD territory is unlikely to be viable based on MRI, so would not pursue CTO procedure or CABG. 4. Obesity: Body mass index is 41 kg/m.  Working on weight loss.  - Insurance does not cover Ozempic or [id number].  Followup in 3 months with APP.   Signed, Marca Ancona, MD  06/24/2023  [facility] [place] [address] [facility] [place] [address] [phone] (office) [phone] (fax)

## 2023-06-27 ENCOUNTER — Other Ambulatory Visit (HOSPITAL_COMMUNITY): Payer: Self-pay | Admitting: Family Medicine

## 2023-07-07 ENCOUNTER — Encounter: Payer: Self-pay | Admitting: Cardiology

## 2023-07-15 ENCOUNTER — Telehealth (HOSPITAL_COMMUNITY): Payer: Self-pay

## 2023-07-15 NOTE — Telephone Encounter (Signed)
 Called and spoke with pt in regards to CR, pt stated she is not able to participate at this time due to car issues.   Closed referral

## 2023-07-28 ENCOUNTER — Telehealth (HOSPITAL_COMMUNITY): Payer: Self-pay

## 2023-07-28 ENCOUNTER — Other Ambulatory Visit (HOSPITAL_COMMUNITY): Payer: Self-pay

## 2023-07-28 NOTE — Telephone Encounter (Signed)
Patient Advocate Encounter  Received notification from [facility] that prior authorization is needed for [facility].  Test claim returns multiple DUR rejections, but does not require prior auth. Contacted pharmacy, overrides applied, confirmed paid claim.  No prior auth needed at this time.  Burnell Blanks, CPhT Rx Patient Advocate Phone: [phone]

## 2023-08-01 NOTE — Progress Notes (Signed)
Remote ICD transmission.

## 2023-08-19 ENCOUNTER — Encounter (HOSPITAL_COMMUNITY): Payer: Self-pay

## 2023-08-19 ENCOUNTER — Observation Stay (HOSPITAL_COMMUNITY): Payer: Medicaid Other

## 2023-08-19 ENCOUNTER — Other Ambulatory Visit: Payer: Self-pay

## 2023-08-19 ENCOUNTER — Emergency Department (HOSPITAL_COMMUNITY): Payer: Medicaid Other

## 2023-08-19 ENCOUNTER — Observation Stay (HOSPITAL_COMMUNITY)
Admission: EM | Admit: 2023-08-19 | Discharge: 2023-08-19 | Disposition: A | Discharge status: Home / Self Care | Payer: Medicaid Other | Attending: Internal Medicine | Admitting: Internal Medicine

## 2023-08-19 DIAGNOSIS — I5023 Acute on chronic systolic (congestive) heart failure: Secondary | ICD-10-CM | POA: Insufficient documentation

## 2023-08-19 DIAGNOSIS — I110 Hypertensive heart disease with heart failure: Secondary | ICD-10-CM | POA: Insufficient documentation

## 2023-08-19 DIAGNOSIS — I5022 Chronic systolic (congestive) heart failure: Secondary | ICD-10-CM | POA: Diagnosis not present

## 2023-08-19 DIAGNOSIS — R200 Anesthesia of skin: Secondary | ICD-10-CM | POA: Diagnosis not present

## 2023-08-19 DIAGNOSIS — F32A Depression, unspecified: Secondary | ICD-10-CM | POA: Diagnosis present

## 2023-08-19 DIAGNOSIS — I2542 Coronary artery dissection: Secondary | ICD-10-CM | POA: Diagnosis present

## 2023-08-19 DIAGNOSIS — Z7901 Long term (current) use of anticoagulants: Secondary | ICD-10-CM | POA: Diagnosis not present

## 2023-08-19 DIAGNOSIS — R42 Dizziness and giddiness: Principal | ICD-10-CM | POA: Insufficient documentation

## 2023-08-19 DIAGNOSIS — Z95810 Presence of automatic (implantable) cardiac defibrillator: Secondary | ICD-10-CM | POA: Diagnosis not present

## 2023-08-19 DIAGNOSIS — Z8659 Personal history of other mental and behavioral disorders: Secondary | ICD-10-CM

## 2023-08-19 DIAGNOSIS — I513 Intracardiac thrombosis, not elsewhere classified: Secondary | ICD-10-CM | POA: Diagnosis not present

## 2023-08-19 DIAGNOSIS — R55 Syncope and collapse: Secondary | ICD-10-CM | POA: Diagnosis not present

## 2023-08-19 DIAGNOSIS — J45909 Unspecified asthma, uncomplicated: Secondary | ICD-10-CM | POA: Diagnosis present

## 2023-08-19 DIAGNOSIS — R29818 Other symptoms and signs involving the nervous system: Secondary | ICD-10-CM | POA: Diagnosis present

## 2023-08-19 DIAGNOSIS — Z79899 Other long term (current) drug therapy: Secondary | ICD-10-CM | POA: Diagnosis not present

## 2023-08-19 DIAGNOSIS — R202 Paresthesia of skin: Secondary | ICD-10-CM | POA: Diagnosis not present

## 2023-08-19 DIAGNOSIS — Z91148 Patient's other noncompliance with medication regimen for other reason: Secondary | ICD-10-CM

## 2023-08-19 DIAGNOSIS — Z6841 Body Mass Index (BMI) 40.0 and over, adult: Secondary | ICD-10-CM | POA: Diagnosis not present

## 2023-08-19 DIAGNOSIS — R519 Headache, unspecified: Secondary | ICD-10-CM | POA: Diagnosis not present

## 2023-08-19 DIAGNOSIS — G459 Transient cerebral ischemic attack, unspecified: Secondary | ICD-10-CM | POA: Diagnosis not present

## 2023-08-19 LAB — COMPREHENSIVE METABOLIC PANEL
ALT: 14 U/L (ref 0–44)
AST: 14 U/L — ABNORMAL LOW (ref 15–41)
Albumin: 4 g/dL (ref 3.5–5.0)
Alkaline Phosphatase: 30 U/L — ABNORMAL LOW (ref 38–126)
Anion gap: 12 (ref 5–15)
BUN: 15 mg/dL (ref 6–20)
CO2: 21 mmol/L — ABNORMAL LOW (ref 22–32)
Calcium: 9 mg/dL (ref 8.9–10.3)
Chloride: 106 mmol/L (ref 98–111)
Creatinine, Ser: 0.64 mg/dL (ref 0.44–1.00)
GFR, Estimated: >60 mL/min (ref >60)
Glucose, Bld: 99 mg/dL (ref 70–99)
Potassium: 3.6 mmol/L (ref 3.5–5.1)
Sodium: 139 mmol/L (ref 135–145)
Total Bilirubin: 1.5 mg/dL — ABNORMAL HIGH (ref 0.0–1.2)
Total Protein: 7.4 g/dL (ref 6.5–8.1)

## 2023-08-19 LAB — CBC
HCT: 46 % (ref 36.0–46.0)
Hemoglobin: 15.9 g/dL — ABNORMAL HIGH (ref 12.0–15.0)
MCH: 32.9 pg (ref 26.0–34.0)
MCHC: 34.6 g/dL (ref 30.0–36.0)
MCV: 95 fL (ref 80.0–100.0)
Platelets: 271 10*3/uL (ref 150–400)
RBC: 4.84 MIL/uL (ref 3.87–5.11)
RDW: 13.2 % (ref 11.5–15.5)
WBC: 5.1 10*3/uL (ref 4.0–10.5)
nRBC: 0 % (ref 0.0–0.2)

## 2023-08-19 LAB — DIFFERENTIAL
Abs Immature Granulocytes: 0.01 10*3/uL (ref 0.00–0.07)
Basophils Absolute: 0 10*3/uL (ref 0.0–0.1)
Basophils Relative: 1 %
Eosinophils Absolute: 0.2 10*3/uL (ref 0.0–0.5)
Eosinophils Relative: 3 %
Immature Granulocytes: 0 %
Lymphocytes Relative: 29 %
Lymphs Abs: 1.5 10*3/uL (ref 0.7–4.0)
Monocytes Absolute: 0.3 10*3/uL (ref 0.1–1.0)
Monocytes Relative: 5 %
Neutro Abs: 3.2 10*3/uL (ref 1.7–7.7)
Neutrophils Relative %: 62 %

## 2023-08-19 LAB — CBG MONITORING, ED: Glucose-Capillary: 87 mg/dL (ref 70–99)

## 2023-08-19 LAB — APTT: aPTT: 27 s (ref 24–36)

## 2023-08-19 LAB — PROTIME-INR
INR: 1.1 (ref 0.8–1.2)
Prothrombin Time: 14.1 s (ref 11.4–15.2)

## 2023-08-19 LAB — LIPASE, BLOOD: Lipase: 18 U/L (ref 11–51)

## 2023-08-19 LAB — TROPONIN I (HIGH SENSITIVITY)
Troponin I (High Sensitivity): 5 ng/L (ref <18)
Troponin I (High Sensitivity): 5 ng/L (ref <18)

## 2023-08-19 LAB — ETHANOL: Alcohol, Ethyl (B): <10 mg/dL (ref <10)

## 2023-08-19 LAB — HCG, SERUM, QUALITATIVE: Preg, Serum: NEGATIVE

## 2023-08-19 MED ORDER — ACETAMINOPHEN 650 MG RE SUPP: 650.0000 mg | RECTAL | Status: DC | PRN

## 2023-08-19 MED ORDER — SENNOSIDES-DOCUSATE SODIUM 8.6-50 MG PO TABS: 1.0000 | ORAL_TABLET | Freq: Every evening | ORAL | Status: DC | PRN

## 2023-08-19 MED ORDER — IOHEXOL 350 MG/ML SOLN
75.0000 mL | Freq: Once | INTRAVENOUS | Status: AC | PRN
  Administered 2023-08-19: 75 mL via INTRAVENOUS

## 2023-08-19 MED ORDER — HEPARIN (PORCINE) 25000 UT/250ML-% IV SOLN
850.0000 [IU]/h | INTRAVENOUS | Status: DC
  Administered 2023-08-19: 850 [IU]/h via INTRAVENOUS
  Filled 2023-08-19: qty 250

## 2023-08-19 MED ORDER — LORAZEPAM 2 MG/ML IJ SOLN: 1.0000 mg | INTRAMUSCULAR | Status: DC | PRN

## 2023-08-19 MED ORDER — STROKE: EARLY STAGES OF RECOVERY BOOK: Freq: Once | Status: DC

## 2023-08-19 MED ORDER — ALBUTEROL SULFATE (2.5 MG/3ML) 0.083% IN NEBU: 3.0000 mL | INHALATION_SOLUTION | Freq: Four times a day (QID) | RESPIRATORY_TRACT | Status: DC | PRN

## 2023-08-19 MED ORDER — ACETAMINOPHEN 160 MG/5ML PO SOLN: 650.0000 mg | ORAL | Status: DC | PRN

## 2023-08-19 MED ORDER — ACETAMINOPHEN 325 MG PO TABS
650.0000 mg | ORAL_TABLET | ORAL | Status: DC | PRN
  Administered 2023-08-19: 650 mg via ORAL
  Filled 2023-08-19: qty 2

## 2023-08-19 MED ORDER — PROCHLORPERAZINE EDISYLATE 10 MG/2ML IJ SOLN
10.0000 mg | Freq: Once | INTRAMUSCULAR | Status: AC
  Administered 2023-08-19: 10 mg via INTRAVENOUS
  Filled 2023-08-19: qty 2

## 2023-08-19 MED ORDER — BUTALBITAL-APAP-CAFFEINE 50-325-40 MG PO TABS: 2.0000 | ORAL_TABLET | Freq: Once | ORAL | Status: DC

## 2023-08-19 MED ORDER — DIPHENHYDRAMINE HCL 50 MG/ML IJ SOLN
25.0000 mg | Freq: Once | INTRAMUSCULAR | Status: AC
  Administered 2023-08-19: 25 mg via INTRAVENOUS
  Filled 2023-08-19: qty 1

## 2023-08-19 MED ORDER — SODIUM CHLORIDE 0.9% FLUSH: 3.0000 mL | Freq: Once | INTRAVENOUS | Status: DC

## 2023-08-19 NOTE — Progress Notes (Signed)
UPDATE  Notified that patient refused MRI.  And that she was going to leave AMA.  Went to see patient at bedside and she was still here she stated that she was having anxiety about the MRI and she is feeling in general anxious and restless in a way that she feels like she is never felt before.  We discussed the need for MRI and to stay for workup for TIA versus CVA.  She states that she understands it is a bad idea to leave and she has friends and family in the medical field who would not recommended but that she feels like she cannot stay and that she just has to leave."  Cannot do this right now".  Discussed with her the option of trying some Ativan both now for anxiety and again with any MRI that we can put off till later in admission.  She understands this option but states she feels like she will not be able to go through with it and that she will still need to leave AMA.  I told her that I will let her nurse know that she wants to leave AMA for her to sign a form but I still do not recommended and that she needs to get this worked up to understand why it happened and what her risk is for to recur.  If she does leave I advised her to resume her blood thinner and in the future to be consistent with her medication use.  I let her know be reasonable not to take her blood pressure medicine today, but that she could add them back 1 at a time to make sure she does not get low blood pressure.  I also recommended she discuss with her cardiologist about adding back Entresto.  In addition, if she does leave thigh reiterated that she needs to contact her PCP to have an MRI done soon as possible.    I continue to recommend patient to stay.  She states that she thinks she will leave AMA, but if she does she will talk to her PCP and her friends and family who are in the medical field.  She also states she will come back if any return or severe symptoms occur.  Patient likely to leave AMA, will addend this  note if she does  Ginette Otto, DO

## 2023-08-19 NOTE — Progress Notes (Signed)
PHARMACY - ANTICOAGULATION CONSULT NOTE  Pharmacy Consult for heparin Indication: Apical thrombus  Allergies  Allergen Reactions   Other Shortness Of Breath    Environmental allergies   Gluten Meal     Gluten makes her wheeze    Patient Measurements: Height: 5\' 1"  (154.9 cm) Weight: 97.5 kg (215 lb) IBW/kg (Calculated) : 47.8 Heparin Dosing Weight: 71kg  Vital Signs: Temp: 99.1 F (37.3 C) (02/14 1030) Temp Source: Oral (02/14 1030) BP: 130/87 (02/14 1230) Pulse Rate: 64 (02/14 1230)  Labs: Recent Labs    08/19/23 1023  HGB 15.9*  HCT 46.0  PLT 271  APTT 27  LABPROT 14.1  INR 1.1  CREATININE 0.64  TROPONINIHS 5    Estimated Creatinine Clearance: 105.9 mL/min (by C-G formula based on SCr of 0.64 mg/dL).   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

## 2023-08-19 NOTE — Code Documentation (Signed)
Stroke Response Nurse Documentation Code Documentation  Sheila Estes is a 33 y.o. female arriving to [facility]  via [facility] on 08/19/2023  with past medical hx of CAD, apical thrombus, bipolar, anxiety/depression. On Eliquis (apixaban) daily. Code stroke was activated by EMS.   Patient from [employer] where she was LKW at 0730 and now complaining of dizziness, headache with light sensitivity, tunnel vision, and R hand/foot numbness. Per EDP, patient was in [employer] watching [employer] when she started to have a hard time understanding what she watching, having tunnel vision, dizziness, headache with light sensitivity, and right hand numbness. While in triage she started having right foot numbness.  Stroke team at the bedside on patient arrival. Labs drawn and patient cleared for CT by Dr. Dalene Seltzer. Patient to CT with team. NIHSS 0, see documentation for details and code stroke times. The following imaging was completed:  CT Head and CTA. Patient is not a candidate for IV Thrombolytic due to completely back to baseline per MD. Patient is not a candidate for IR due to no LVO noted on imaging per MD.   Care Plan: VS/NIHSS q53min until out of window at 1200, then q2hr x12hrs, then q4hr.   Bedside handoff with ED RN Idalia Needle.    Felecia Jan  Stroke Response RN

## 2023-08-19 NOTE — H&P (Signed)
History and Physical   Sheila Estes ZOX:[id number] DOB: Feb 19, 1991 DOA: 08/19/2023  PCP: Grayce Sessions, NP   Patient coming from: Home  Chief Complaint: Neurodeficit, code stroke  HPI: Sheila Estes is a 33 y.o. female with medical history significant of bipolar disorder, depression, asthma, chronic systolic CHF, status post ICD, spontaneous LAD dissection, apical thrombus, obesity presenting with neurodeficit.  Patient reported new onset dizziness, tunnel vision, right hand and foot numbness.  Some associated headache.  Presented to the ED and a code stroke was called.  Return to baseline in the ED.  Denies fevers, chills, chest pain, shortness of breath, abdominal pain, constipation, diarrhea, nausea, vomiting.  Of note, she has intermittent adherence to most medications including her Eliquis.  ED Course: Vital signs in the ED notable for blood pressure in the 110s to 140 systolic.  Lab workup included CMP with bicarb 21, T. bili 1.5.  CBC with hemoglobin 15.9.  PT, PTT, INR normal.  Troponin negative with repeat pending.  Lipase pending.  Ethanol level negative.  CT head showed no acute abnormality.  CTA head neck showed no acute normality.  MR brain ordered and is pending.  Patient received heparin and Compazine and Benadryl in the ED.  Neurology consulted recommending CVA versus TIA workup and above heparin given she has been off of her Eliquis.  Her ICD is being interrogated but no results from that yet.  Review of Systems: As per HPI otherwise all other systems reviewed and are negative.  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    Past Surgical History:  Procedure Laterality Date   ARTHROSCOPTIC SHOULDER Left 2007   CERVICAL CONIZATION W/BX N/A 12/14/2016   Procedure: COLD KNIFE CONIZATION, REMOVAL OF BODY JEWELRY;  Surgeon: Navarino Bing, MD;  Location: [facility];  Service: Gynecology;  Laterality: N/A;   ICD IMPLANT N/A 08/26/2021   Procedure: ICD IMPLANT;  Surgeon: Duke Salvia, MD;  Location: [facility] INVASIVE CV LAB;  Service: Cardiovascular;  Laterality: N/A;   RIGHT/LEFT HEART CATH AND CORONARY ANGIOGRAPHY N/A 08/30/2018   Procedure: RIGHT/LEFT HEART CATH AND CORONARY ANGIOGRAPHY;  Surgeon: Laurey Morale, MD;  Location: [facility] INVASIVE CV LAB;  Service: Cardiovascular;  Laterality: N/A;    Social History  reports that she has never smoked. She has never used smokeless tobacco. She reports current alcohol use. She reports that she does not use drugs.  Allergies  Allergen Reactions   Gluten Meal Shortness Of Breath and Other (See Comments)    Wheezing    Other Shortness Of Breath    Environmental allergies    Family History  Problem Relation Age of Onset   Hypertension Mother    Hypertension Father    Diabetes Maternal Grandmother    Hypertension Maternal Grandfather   Reviewed on admission  Prior to Admission medications   Medication Sig Start Date End Date Taking? Authorizing  Provider  albuterol (VENTOLIN HFA) 108 (90 Base) MCG/ACT inhaler Inhale 2 puffs into the lungs every 6 (six) hours as needed for wheezing or shortness of breath. 03/29/22  Yes Laurey Morale, MD  apixaban (ELIQUIS) 5 MG TABS tablet Take 1 tablet (5 mg total) by mouth 2 (two) times daily. 06/23/23  Yes Laurey Morale, MD  BIDIL 20-37.5 MG tablet Take 0.5 tablets by mouth 3 (three) times daily. 06/23/23  Yes Laurey Morale, MD  carvedilol (COREG) 25 MG tablet Take 1 tablet (25 mg  total) by mouth 2 (two) times daily with a meal. 02/15/23  Yes Laurey Morale, MD  dapagliflozin propanediol (FARXIGA) 10 MG TABS tablet TAKE 1 TABLET(10 MG) BY MOUTH DAILY BEFORE BREAKFAST 02/24/23  Yes [place], Anderson Malta, FNP  furosemide (LASIX) 20 MG tablet Take 1 tablet (20 mg total) by mouth daily. Patient taking differently: Take 20 mg by mouth daily as needed for fluid (swelling). 02/15/23  Yes Laurey Morale, MD  potassium chloride SA (KLOR-CON M) 20 MEQ tablet Take 1 tablet (20 mEq total) by mouth daily. With Lasix Patient taking differently: Take 20 mEq by mouth daily as needed (Lasix use). 02/15/23  Yes Laurey Morale, MD  rosuvastatin (CRESTOR) 40 MG tablet Take 1 tablet (40 mg total) by mouth daily. Patient taking differently: Take 40 mg by mouth at bedtime. 06/23/23 09/21/23 Yes Laurey Morale, MD  sacubitril-valsartan (ENTRESTO) 97-103 MG TAKE 1 TABLET BY MOUTH TWICE DAILY 06/28/23  Yes Laurey Morale, MD  spironolactone (ALDACTONE) 25 MG tablet Take 1 tablet (25 mg total) by mouth daily. Patient taking differently: Take 25 mg by mouth at bedtime. 06/06/23  Yes Laurey Morale, MD    Physical Exam: Vitals:   08/19/23 1130 08/19/23 1215 08/19/23 1230 08/19/23 1300  BP: 114/82 126/84 130/87 128/77  Pulse: 67  64 66  Resp: 17 19 (!) 21 15  Temp:      TempSrc:      SpO2: 100%  100% 100%  Weight:      Height:        Physical Exam Constitutional:      General: She is not in acute distress.    Appearance: Normal appearance. She is obese.  HENT:     Head: Normocephalic and atraumatic.     Mouth/Throat:     Mouth: Mucous membranes are moist.     Pharynx: Oropharynx is clear.  Eyes:     Extraocular Movements: Extraocular movements intact.     Pupils: Pupils are equal, round, and reactive to light.  Cardiovascular:     Rate and Rhythm: Normal rate and regular rhythm.     Pulses: Normal pulses.     Heart sounds: Normal heart sounds.  Pulmonary:     Effort: Pulmonary  effort is normal. No respiratory distress.     Breath sounds: Normal breath sounds.  Abdominal:     General: Bowel sounds are normal. There is no distension.     Palpations: Abdomen is soft.     Tenderness: There is no abdominal tenderness.  Musculoskeletal:        General: No swelling or deformity.  Skin:    General: Skin is warm and dry.  Neurological:     Comments: Mental Status: Patient is awake, alert, oriented x3 No signs of aphasia or neglect Cranial Nerves: II: Pupils equal, round, and reactive to light.   III,IV, VI: EOMI without ptosis or diploplia.  V: Facial sensation is symmetric  to light touch. VII: Facial movement is symmetric.  VIII: hearing is intact to voice X: Uvula elevates symmetrically XI: Shoulder shrug is symmetric. XII: tongue is midline without atrophy or fasciculations.  Motor: Decent effort thorughout, at Least 5/5 bilateral UE, 5/5 bilateral lower extremitiy  Sensory: Sensation is grossly intact bilateral UEs & LEs Cerebellar: Finger-Nose intact bilalat    Labs on Admission: I have personally reviewed following labs and imaging studies  CBC: Recent Labs  Lab 08/19/23 1023  WBC 5.1  NEUTROABS 3.2  HGB 15.9*  HCT 46.0  MCV 95.0  PLT 271    Basic Metabolic Panel: Recent Labs  Lab 08/19/23 1023  NA 139  K 3.6  CL 106  CO2 21*  GLUCOSE 99  BUN 15  CREATININE 0.64  CALCIUM 9.0    GFR: Estimated Creatinine Clearance: 105.9 mL/min (by C-G formula based on SCr of 0.64 mg/dL).  Liver Function Tests: Recent Labs  Lab 08/19/23 1023  AST 14*  ALT 14  ALKPHOS 30*  BILITOT 1.5*  PROT 7.4  ALBUMIN 4.0    Urine analysis:    Component Value Date/Time   COLORURINE YELLOW 07/05/2015 1611   APPEARANCEUR CLEAR 07/05/2015 1611   LABSPEC 1.013 07/05/2015 1611   PHURINE 7.0 07/05/2015 1611   GLUCOSEU NEGATIVE 07/05/2015 1611   HGBUR LARGE (A) 07/05/2015 1611   BILIRUBINUR NEGATIVE 07/05/2015 1611   KETONESUR NEGATIVE 07/05/2015 1611    PROTEINUR NEGATIVE 07/05/2015 1611   UROBILINOGEN 1.0 03/17/2013 1356   NITRITE NEGATIVE 07/05/2015 1611   LEUKOCYTESUR NEGATIVE 07/05/2015 1611    Radiological Exams on Admission: CT ANGIO HEAD NECK W WO CM (CODE STROKE) Result Date: 08/19/2023 CLINICAL DATA:  Provided history: Neuro deficit, acute, stroke suspected. Additional history obtained from electronic MEDICAL RECORD NUMBERNear syncope this morning, headache, sudden onset dizziness, right hand numbness. EXAM: CT ANGIOGRAPHY HEAD AND NECK WITH AND WITHOUT CONTRAST TECHNIQUE: Multidetector CT imaging of the head and neck was performed using the standard protocol during bolus administration of intravenous contrast. Multiplanar CT image reconstructions and MIPs were obtained to evaluate the vascular anatomy. Carotid stenosis measurements (when applicable) are obtained utilizing NASCET criteria, using the distal internal carotid diameter as the denominator. RADIATION DOSE REDUCTION: This exam was performed according to the departmental dose-optimization program which includes automated exposure control, adjustment of the mA and/or kV according to patient size and/or use of iterative reconstruction technique. CONTRAST:  75mL OMNIPAQUE IOHEXOL 350 MG/ML SOLN COMPARISON:  Non-contrast head CT performed earlier today 08/19/2023. FINDINGS: CTA NECK FINDINGS Aortic arch: Standard aortic branching. The visualized thoracic aorta is normal in caliber. Streak/beam hardening artifact arising from a dense contrast bolus partially obscures the right subclavian artery. Within this limitation, there is no appreciable hemodynamically significant innominate or proximal subclavian artery stenosis. Right carotid system: CCA and ICA patent within the neck without stenosis or significant atherosclerotic disease. No evidence of dissection. Left carotid system: CCA and ICA patent within the neck without stenosis or significant atherosclerotic disease. No evidence of dissection.  Vertebral arteries: Patent within the neck without stenosis or significant atherosclerotic disease. No evidence of dissection. Skeleton: Cervical spondylosis. No acute fracture or aggressive osseous lesion. Other neck: No neck mass or cervical lymphadenopathy. Upper chest: No consolidation within the imaged lung apices. Review of the MIP images confirms the above findings CTA HEAD FINDINGS Anterior circulation: The intracranial internal carotid arteries are patent. The M1 middle cerebral arteries are patent. No M2 proximal branch occlusion or high-grade proximal stenosis. The anterior cerebral  arteries are patent. Hypoplastic left A1 segment. No intracranial aneurysm is identified. Posterior circulation: The intracranial vertebral arteries are patent. The basilar artery is patent. The posterior cerebral arteries are patent. Posterior communicating arteries are diminutive or absent, bilaterally. Venous sinuses: Within the limitations of contrast timing, no convincing thrombus. Anatomic variants: As described. Review of the MIP images confirms the above findings No emergent large vessel occlusion identified. These results were communicated to Dr. Derry Lory At 11:20 amon 2/14/2025by text page via the [facility] messaging system. IMPRESSION: CTA neck: The common carotid, internal carotid and vertebral arteries are patent within the neck without stenosis or significant atherosclerotic disease. No evidence of dissection. CTA head: No proximal intracranial large vessel occlusion or high-grade proximal arterial stenosis identified. Electronically Signed   By: Jackey Loge D.O.   On: 08/19/2023 11:20   CT HEAD CODE STROKE WO CONTRAST Result Date: 08/19/2023 CLINICAL DATA:  Code stroke.  Stroke suspected EXAM: CT HEAD WITHOUT CONTRAST TECHNIQUE: Contiguous axial images were obtained from the base of the skull through the vertex without intravenous contrast. RADIATION DOSE REDUCTION: This exam was performed according to the  departmental dose-optimization program which includes automated exposure control, adjustment of the mA and/or kV according to patient size and/or use of iterative reconstruction technique. COMPARISON:  None Available. FINDINGS: Brain: No evidence of acute infarction, hemorrhage, hydrocephalus, extra-axial collection or mass lesion/mass effect. Vascular: No hyperdense vessel or unexpected calcification. Skull: Normal. Negative for fracture or focal lesion. Sinuses/Orbits: Middle ear or mastoid effusion. Paranasal are clear. Orbits are unremarkable. Other: None. ASPECTS [facility] Stroke Program Early CT Score): 10 IMPRESSION: No hemorrhage or CT evidence of an acute cortical infarct. Aspects is 10. Findings were paged to Dr. Derry Lory on 08/19/23 at 10:50 a.m. Electronically Signed   By: Lorenza Cambridge M.D.   On: 08/19/2023 10:51   EKG: Independently reviewed.  Sinus rhythm at 77 bpm, QTc 523, nonspecific T wave changes.  Assessment/Plan Principal Problem:   Acute focal neurological deficit Active Problems:   BMI 40.0-44.9, adult (HCC)   Spontaneous dissection of coronary artery- LAD   Chronic systolic heart failure (HCC)   ICD (implantable cardioverter-defibrillator) in place - BSx   History of bipolar disorder   Depression   Asthma   Focal neurologic deficit TIA versus CVA > Presenting with dizziness, tunnel vision, right hand and right foot numbness.  Symptoms have resolved. > On anticoagulation with history of apical thrombus.  Has been intermittently adherent with anticoagulation and other medications. > CT head and CTA head neck showed no acute normality.  MRI brain pending. > Neurology following recommending CVA versus TIA workup. - Appreciate neurology recommendations - Allow for permissive HTN (systolic < 220 and diastolic < 120) - On heparin as recommended by neurology - Statin, pending results of lipid panel - Echocardiogram  - A1C  - Lipid panel  - Tele monitoring  - SLP eval -  PT/OT  Chronic systolic CHF > Last echo was last year with EF 20 to 25%, G3 DD, normal RV function.  > Status post ICD.  > Reports that she is only intermittently taking and has not taken recently [employer], BiDil, [place].  Only takes Lasix as needed.  Takes spironolactone daily. - Holding antihypertensives in the setting of permissive hypertension as above - Ins and outs, daily weights  History of apical thrombus > Inconsistent anticoagulation adherence - Heparin as above  History of spontaneous LAD dissection - Noted  Asthma - Continue as needed albuterol  History of  depression and bipolar disorder - Noted  DVT prophylaxis: Heparin Code Status:   Full Family Communication:  None on admission  Disposition Plan:   Patient is from:  Home  Anticipated DC to:  Home  Anticipated DC date:  1 to 2 days  Anticipated DC barriers: None  Consults called:  Neurology Admission status:  Observation, telemetry  Severity of Illness: The appropriate patient status for this patient is OBSERVATION. Observation status is judged to be reasonable and necessary in order to provide the required intensity of service to ensure the patient's safety. The patient's presenting symptoms, physical exam findings, and initial radiographic and laboratory data in the context of their medical condition is felt to place them at decreased risk for further clinical deterioration. Furthermore, it is anticipated that the patient will be medically stable for discharge from the hospital within 2 midnights of admission.    Synetta Fail MD [facility]  How to contact the [facility] Attending or Consulting provider 7A - 7P or covering provider during after hours 7P -7A, for this patient?   Check the care team in [facility] and look for a) attending/consulting [facility] provider listed and b) the [facility] team listed Log into [email or web address] and use [place]'s universal password to access. If you do not have the password,  please contact the hospital operator. Locate the [facility] provider you are looking for under [facility] and page to a number that you can be directly reached. If you still have difficulty reaching the provider, please page the [facility] (Director on Call) for the Hospitalists listed on amion for assistance.  08/19/2023, 2:11 PM

## 2023-08-19 NOTE — Progress Notes (Signed)
Patient went for MRI test and while being placed in scanner decided she no longer wanted to have scan done. This nurse asked patient what was wrong .Patient stated ,"My anxiety is to bad to be placed in that machine." This nurse informed patient that we could get medication ordered for anxiety.Patient then stated," I don't want this test done. I want to go home." This nurse informed Charge RN Maralyn Sago and patient was taken back to ER . Patient refusing to be placed back on telemetry monitor at this time. Patient primary nurse Paige notified.

## 2023-08-19 NOTE — Consult Note (Signed)
NEUROLOGY CONSULT NOTE   Date of service: August 19, 2023 Patient Name: Sheila Estes MRN:  [id number] DOB:  02/18/1991 Chief Complaint: "R hand numbness" Requesting Provider: Alvira Monday, MD  History of Present Illness  Sheila Estes is a 33 y.o. female with hx of CAD, HFrEF (EF 25-30%), Apical Thrombus (on eliquis since December, transitioned from coumadin), bipolar, anxiety/depression who presented to the ED due to dizziness, tunnel vision and R hand nukbness. CODE STROKE was called in triage by EDP.  On neurology exam, patient denies headache or SOB. She does not have any visual deficits or focal weakness. She had c/o right foot numbness as well in triage, but this and the right hand numbness has subsided.   LKW: 0730 Modified rankin score: 0-Completely asymptomatic and back to baseline post- stroke IV Thrombolysis: not offered, symtpoms reolved. EVT: not offered, no LVO.  NIHSS components Score: Comment  1a Level of Conscious 0[x]  1[]  2[]  3[]      1b LOC Questions 0[x]  1[]  2[]       1c LOC Commands 0[x]  1[]  2[]       2 Best Gaze 0[x]  1[]  2[]       3 Visual 0[x]  1[]  2[]  3[]      4 Facial Palsy 0[x]  1[]  2[]  3[]      5a Motor Arm - left 0[x]  1[]  2[]  3[]  4[]  UN[]    5b Motor Arm - Right 0[x]  1[]  2[]  3[]  4[]  UN[]    6a Motor Leg - Left 0[x]  1[]  2[]  3[]  4[]  UN[]    6b Motor Leg - Right 0[x]  1[]  2[]  3[]  4[]  UN[]    7 Limb Ataxia 0[x]  1[]  2[]  3[]  UN[]     8 Sensory 0[x]  1[]  2[]  UN[]      9 Best Language 0[x]  1[]  2[]  3[]      10 Dysarthria 0[x]  1[]  2[]  UN[]      11 Extinct. 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   Pneumonia    CHILDHOOD   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    Past Surgical History:  Procedure Laterality Date   ARTHROSCOPTIC SHOULDER Left 2007   CERVICAL CONIZATION W/BX N/A 12/14/2016   Procedure: COLD KNIFE CONIZATION, REMOVAL OF BODY JEWELRY;  Surgeon: Springdale Bing, MD;  Location: [facility];  Service: Gynecology;  Laterality: N/A;   ICD IMPLANT N/A 08/26/2021   Procedure: ICD IMPLANT;  Surgeon: Duke Salvia, MD;  Location: [facility] INVASIVE CV LAB;  Service: Cardiovascular;  Laterality: N/A;   RIGHT/LEFT HEART CATH AND CORONARY ANGIOGRAPHY N/A 08/30/2018   Procedure: RIGHT/LEFT HEART CATH AND CORONARY ANGIOGRAPHY;  Surgeon: Laurey Morale, MD;  Location: [facility] INVASIVE CV LAB;  Service: Cardiovascular;  Laterality: N/A;    Family History: Family History  Problem Relation Age of Onset   Hypertension Mother    Hypertension  Father    Diabetes Maternal Grandmother    Hypertension Maternal Grandfather     Social History  reports that she has never smoked. She has never used smokeless tobacco. She reports current alcohol use. She reports that she does not use drugs.  Allergies  Allergen Reactions   Other Shortness Of Breath    Environmental allergies   Gluten Meal     Gluten makes her wheeze    Medications   Current Facility-Administered Medications:    sodium chloride flush (NS) 0.9 % injection 3 mL, 3 mL, Intravenous, Once, Alvira Monday, MD  Current Outpatient Medications:    albuterol (VENTOLIN HFA) 108 (90 Base) MCG/ACT inhaler, Inhale 2 puffs into the lungs every 6 (six) hours as needed for wheezing or shortness of breath., Disp: 1 each, Rfl: 2   apixaban (ELIQUIS) 5 MG TABS tablet, Take 1 tablet (5 mg total) by mouth 2 (two) times daily., Disp: 60 tablet, Rfl:  6   atorvastatin (LIPITOR) 80 MG tablet, TAKE 1 TABLET(80 MG) BY MOUTH DAILY AT 6 PM, Disp: 90 tablet, Rfl: 1   BIDIL 20-37.5 MG tablet, Take 0.5 tablets by mouth 3 (three) times daily., Disp: 45 tablet, Rfl: 3   carvedilol (COREG) 25 MG tablet, Take 1 tablet (25 mg total) by mouth 2 (two) times daily with a meal., Disp: 180 tablet, Rfl: 3   dapagliflozin propanediol (FARXIGA) 10 MG TABS tablet, TAKE 1 TABLET(10 MG) BY MOUTH DAILY BEFORE BREAKFAST, Disp: 90 tablet, Rfl: 3   fluticasone (FLOVENT HFA) 110 MCG/ACT inhaler, Inhale 2 puffs into the lungs 2 (two) times daily at 10 AM and 5 PM., Disp: 1 each, Rfl: 12   furosemide (LASIX) 20 MG tablet, Take 1 tablet (20 mg total) by mouth daily., Disp: 180 tablet, Rfl: 3   ipratropium-albuterol (DUONEB) 0.5-2.5 (3) MG/3ML SOLN, Take 3 mLs by nebulization every 6 (six) hours as needed., Disp: 360 mL, Rfl: 0   potassium chloride SA (KLOR-CON M) 20 MEQ tablet, Take 1 tablet (20 mEq total) by mouth daily. With Lasix, Disp: 90 tablet, Rfl: 3   rosuvastatin (CRESTOR) 40 MG tablet, Take 1 tablet (40 mg total) by mouth daily., Disp: 90 tablet, Rfl: 3   sacubitril-valsartan (ENTRESTO) 97-103 MG, TAKE 1 TABLET BY MOUTH TWICE DAILY, Disp: 60 tablet, Rfl: 10   spironolactone (ALDACTONE) 25 MG tablet, Take 1 tablet (25 mg total) by mouth daily., Disp: 90 tablet, Rfl: 3  Vitals   Vitals:   08/19/23 1019 08/19/23 1030  BP: 131/80   Pulse: 79   Resp: 16   Temp:  99.1 F (37.3 C)  TempSrc:  Oral  SpO2: 100%     There is no height or weight on file to calculate BMI.  Physical Exam   General: Laying comfortably in bed; in no acute distress. HENT: Normal oropharynx and mucosa. Normal external appearance of ears and nose.  Neck: Supple, no pain or tenderness  CV: No JVD. No peripheral edema.  Pulmonary: Symmetric Chest rise. Normal respiratory effort.  Abdomen: Soft to touch, non-tender.  Ext: No cyanosis, edema, or deformity  Skin: No rash. Normal palpation  of skin.   Musculoskeletal: Normal digits and nails by inspection. No clubbing.   Neurologic Examination  Mental status/Cognition: Alert, oriented to self, place, month and year, good attention.  Speech/language: Fluent, comprehension intact, object naming intact, repetition intact.  Cranial nerves:   CN II Pupils equal and reactive to light, no VF deficits    CN III,IV,VI EOM intact,  no gaze preference or deviation, no nystagmus    CN V normal sensation in V1, V2, and V3 segments bilaterally    CN VII no asymmetry, no nasolabial fold flattening    CN VIII normal hearing to speech    CN IX & X normal palatal elevation, no uvular deviation    CN XI 5/5 head turn and 5/5 shoulder shrug bilaterally    CN XII midline tongue protrusion    Motor:  Muscle bulk: normal, tone normal, pronator drift none tremor none Mvmt Root Nerve  Muscle Right Left Comments  SA C5/6 Ax Deltoid 5 5   EF C5/6 Mc Biceps 5 5   EE C6/7/8 Rad Triceps 5 5   WF C6/7 Med FCR     WE C7/8 PIN ECU     F Ab C8/T1 U ADM/FDI 5 5   HF L1/2/3 Fem Illopsoas 5 5   KE L2/3/4 Fem Quad 5 5   DF L4/5 D Peron Tib Ant 5 5   PF S1/2 Tibial Grc/Sol 5 5    Sensation:  Light touch Intact throughout   Pin prick    Temperature    Vibration   Proprioception    Coordination/Complex Motor:  - Finger to Nose intact BL - Heel to shin intact BL - Rapid alternating movement are normal - Gait: deferred.  Labs/Imaging/Neurodiagnostic studies   CBC: No results for input(s): "WBC", "NEUTROABS", "HGB", "HCT", "MCV", "PLT" in the last 168 hours. Basic Metabolic Panel:  Lab Results  Component Value Date   NA 138 06/23/2023   K 3.8 06/23/2023   CO2 26 06/23/2023   GLUCOSE 85 06/23/2023   BUN 14 06/23/2023   CREATININE 0.77 06/23/2023   CALCIUM 9.6 06/23/2023   GFRNONAA >60 06/23/2023   GFRAA >60 02/11/2020   Lipid Panel:  Lab Results  Component Value Date   LDLCALC 89 02/15/2023   HgbA1c:  Lab Results  Component Value  Date   HGBA1C 5.9 (H) 04/17/2020   Urine Drug Screen:     Component Value Date/Time   LABOPIA NONE DETECTED 07/06/2014 0112   COCAINSCRNUR NONE DETECTED 07/06/2014 0112   LABBENZ NONE DETECTED 07/06/2014 0112   AMPHETMU NONE DETECTED 07/06/2014 0112   THCU NONE DETECTED 07/06/2014 0112   LABBARB NONE DETECTED 07/06/2014 0112    Alcohol Level     Component Value Date/Time   ETH <5 07/06/2014 0055   INR  Lab Results  Component Value Date   INR 1.7 (H) 06/23/2023   APTT No results found for: "APTT" AED levels: No results found for: "PHENYTOIN", "ZONISAMIDE", "LAMOTRIGINE", "LEVETIRACETA"  CT Head without contrast(Personally reviewed): CTH was negative for a large hypodensity concerning for a large territory infarct or hyperdensity concerning for an ICH  CT angio Head and Neck with contrast(Personally reviewed): No LVO  MRI Brain: Pending  ASSESSMENT   BRITTYN SALAZ is a 33 y.o. female with hx of CAD, HFrEF (EF 25-30%), Apical Thrombus (on eliquis since December, transitioned from coumadin), bipolar, anxiety/depression who presented to the ED due to dizziness, tunnel vision and R hand nukbness.  Symptoms have resolved.  Suspect small stroke or a TIA. Apical thrombus with non compliance with eliquis likely to be the etiology.  I spoke with Dr. Dalene Seltzer and recommend starting her on Heparin gtt. We can do Neuro scale with no bolus dosing for now pending MRI Brain.  RECOMMENDATIONS  - Frequent Neuro checks per stroke unit protocol - Recommend brain imaging with MRI Brain without contrast -  Recommend obtaining TTE  - Recommend obtaining Lipid panel with LDL - Please start statin if LDL > 70 - Recommend HbA1c to evaluate for diabetes and how well it is controlled. - Recommend starting her on Heparin gtt. We can do Neuro scale with no bolus dosing for now pending MRI Brain - SBP goal - permissive hypertension first 24 h < 220/110. Held home meds.  - Recommend  Telemetry monitoring for arrythmia - Recommend bedside swallow screen prior to PO intake. - Stroke education booklet - Recommend PT/OT/SLP consult - Q27mins monitoring while she is within tnkase window.  ______________________________________________________________________  This patient is critically ill and at significant risk of neurological worsening, death and care requires constant monitoring of vital signs, hemodynamics,respiratory and cardiac monitoring, neurological assessment, discussion with family, other specialists and medical decision making of high complexity. I spent 40 minutes of neurocritical care time  in the care of  this patient. This was time spent independent of any time provided by nurse practitioner or PA.  Erick Blinks [facility] 08/19/2023  11:22 AM   Signed, Lynnae January, NP [facility]

## 2023-08-19 NOTE — Progress Notes (Signed)
Attempted Echocardiogram, Patient refused. She said she already had a Echo recently and she's going home, in addition patient refused a limited exam as well.

## 2023-08-19 NOTE — ED Notes (Signed)
Patient transported to MRI with RN to monitor.

## 2023-08-19 NOTE — ED Triage Notes (Signed)
Patient with near syncope this morning and headache while in [employer]. States sudden onset of dizziness at 0730 and on arrival has R hand numbness. Supposed to be taking Eliquis but has not taken it in two weeks. Also would like a pregnancy test.

## 2023-08-19 NOTE — ED Provider Notes (Signed)
[place] EMERGENCY DEPARTMENT AT [facility] Provider Note   CSN: [id number] Arrival date & time: 08/19/23  1012  An emergency department physician performed an initial assessment on this suspected stroke patient at 1037.  History  Chief Complaint  Patient presents with   Near Syncope    Sheila Estes is a 33 y.o. female.  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  Reports she was walking in the [employer] this morning she developed dizziness, both combination of lightheadedness and room spinning, blurred vision, headache which she rated a 7 out of 10, and right hand numbness.  She reports she was waiting in line and was starting to feel more dizzy for developing symptoms of right hand numbness.  Reports that her vision was blurry on the peripheries on both sides.  Denies any double vision.  She denies any significant change in her speech but reports that she was watching some TikTok videos and felt like she could not understand what people were saying and had to watch them several times.  She denies any fever, cough.  She was going to [place] today to get a pregnancy test and took it and found it to be negative.  She did have abdominal pain yesterday that has since resolved.  While in triage she also reports right foot numbness in addition to the right hand numbness.  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 Authorizing Provider  albuterol (VENTOLIN HFA) 108 (90 Base) MCG/ACT inhaler Inhale 2 puffs into the lungs every 6 (six) hours as needed for wheezing or shortness of breath. 03/29/22  Yes Laurey Morale, MD  apixaban (ELIQUIS) 5 MG TABS tablet Take 1 tablet (5 mg total) by mouth 2 (two) times daily. 06/23/23  Yes Laurey Morale, MD  BIDIL 20-37.5 MG tablet Take 0.5 tablets by mouth 3 (three) times daily. 06/23/23  Yes Laurey Morale, MD  carvedilol (COREG) 25 MG tablet Take 1 tablet (25 mg total) by mouth 2 (two) times daily with a meal. 02/15/23  Yes Laurey Morale, MD  dapagliflozin propanediol (FARXIGA) 10 MG TABS tablet TAKE 1 TABLET(10 MG) BY MOUTH DAILY BEFORE BREAKFAST 02/24/23  Yes [place], Anderson Malta, FNP  furosemide (LASIX) 20 MG tablet Take 1 tablet (20 mg total) by mouth daily. Patient taking differently: Take 20 mg by mouth daily as needed for fluid (swelling). 02/15/23  Yes Laurey Morale, MD  potassium  chloride SA (KLOR-CON M) 20 MEQ tablet Take 1 tablet (20 mEq total) by mouth daily. With Lasix Patient taking differently: Take 20 mEq by mouth daily as needed (Lasix use). 02/15/23  Yes Laurey Morale, MD  rosuvastatin (CRESTOR) 40 MG tablet Take 1 tablet (40 mg total) by mouth daily. Patient taking differently: Take 40 mg by mouth at bedtime. 06/23/23 09/21/23 Yes Laurey Morale, MD  sacubitril-valsartan (ENTRESTO) 97-103 MG TAKE 1 TABLET BY MOUTH TWICE DAILY 06/28/23  Yes Laurey Morale, MD  spironolactone (ALDACTONE) 25 MG tablet Take 1 tablet (25 mg total) by mouth daily. Patient taking differently: Take 25  mg by mouth at bedtime. 06/06/23  Yes Laurey Morale, MD      Allergies    Gluten meal and Other    Review of Systems   Review of Systems  Physical Exam Updated Vital Signs BP 124/85   Pulse 67   Temp 98.4 F (36.9 C) (Oral)   Resp 16   Ht 5\' 1"  (1.549 m)   Wt 97.5 kg   SpO2 100%   BMI 40.62 kg/m  Physical Exam Vitals and nursing note reviewed.  Constitutional:      General: She is not in acute distress.    Appearance: She is well-developed. She is not diaphoretic.  HENT:     Head: Normocephalic and atraumatic.  Eyes:     Conjunctiva/sclera: Conjunctivae normal.  Cardiovascular:     Rate and Rhythm: Normal rate and regular rhythm.     Heart sounds: Normal heart sounds. No murmur heard.    No friction rub. No gallop.  Pulmonary:     Effort: Pulmonary effort is normal. No respiratory distress.     Breath sounds: Normal breath sounds. No wheezing or rales.  Abdominal:     General: There is no distension.     Palpations: Abdomen is soft.     Tenderness: There is no abdominal tenderness. There is no guarding.  Musculoskeletal:        General: No tenderness.     Cervical back: Normal range of motion.  Skin:    General: Skin is warm and dry.     Findings: No erythema or rash.  Neurological:     Mental Status: She is alert and oriented to person, place, and time.     Cranial Nerves: Cranial nerve deficit: bilateral cheek numbness, otherwise tested and no sign of deficit.     Sensory: Sensory deficit (right hand and foot, also reports bilateral cheeks) present.     Motor: No weakness.     Coordination: Coordination abnormal (question past pointing on right but then appeared improved).     Gait: Gait normal.     ED Results / Procedures / Treatments   Labs (all labs ordered are listed, but only abnormal results are displayed) Labs Reviewed  CBC - Abnormal; Notable for the following components:      Result Value   Hemoglobin 15.9 (*)    All other components within  normal limits  COMPREHENSIVE METABOLIC PANEL - Abnormal; Notable for the following components:   CO2 21 (*)    AST 14 (*)    Alkaline Phosphatase 30 (*)    Total Bilirubin 1.5 (*)    All other components within normal limits  PROTIME-INR  APTT  DIFFERENTIAL  ETHANOL  HCG, SERUM, QUALITATIVE  LIPASE, BLOOD  HEPARIN LEVEL (UNFRACTIONATED)  HIV ANTIBODY (ROUTINE TESTING W REFLEX)  HEPARIN LEVEL (UNFRACTIONATED)  CBC  LIPID PANEL  HEMOGLOBIN A1C  BASIC METABOLIC PANEL  I-STAT CHEM 8, ED  CBG MONITORING, ED  TROPONIN I (HIGH SENSITIVITY)  TROPONIN I (HIGH SENSITIVITY)    EKG EKG Interpretation Date/Time:  Friday August 19 2023 11:07:07 EST Ventricular Rate:  77 PR Interval:  154 QRS Duration:  105 QT Interval:  462 QTC Calculation: 523 R Axis:   259  Text Interpretation: Sinus rhythm Inferior infarct, old Probable lateral infarct, age indeterminate Prolonged QT interval No significant change since last tracing Confirmed by Alvira Monday ([id number]) on 08/19/2023 5:10:17 PM  Radiology CT ANGIO HEAD NECK W WO CM (CODE STROKE) Result Date: 08/19/2023 CLINICAL DATA:  Provided history: Neuro deficit, acute, stroke suspected. Additional history obtained from electronic MEDICAL RECORD NUMBERNear syncope this morning, headache, sudden onset dizziness, right hand numbness. EXAM: CT ANGIOGRAPHY HEAD AND NECK WITH AND WITHOUT CONTRAST TECHNIQUE: Multidetector CT imaging of the head and neck was performed using the standard protocol during bolus administration of intravenous contrast. Multiplanar CT image reconstructions and MIPs were obtained to evaluate the vascular anatomy. Carotid stenosis measurements (when applicable) are obtained utilizing NASCET criteria, using the distal internal carotid diameter as the denominator. RADIATION DOSE REDUCTION: This exam was performed according to the departmental dose-optimization program which includes automated exposure control, adjustment of the mA and/or  kV according to patient size and/or use of iterative reconstruction technique. CONTRAST:  75mL OMNIPAQUE IOHEXOL 350 MG/ML SOLN COMPARISON:  Non-contrast head CT performed earlier today 08/19/2023. FINDINGS: CTA NECK FINDINGS Aortic arch: Standard aortic branching. The visualized thoracic aorta is normal in caliber. Streak/beam hardening artifact arising from a dense contrast bolus partially obscures the right subclavian artery. Within this limitation, there is no appreciable hemodynamically significant innominate or proximal subclavian artery stenosis. Right carotid system: CCA and ICA patent within the neck without stenosis or significant atherosclerotic disease. No evidence of dissection. Left carotid system: CCA and ICA patent within the neck without stenosis or significant atherosclerotic disease. No evidence of dissection. Vertebral arteries: Patent within the neck without stenosis or significant atherosclerotic disease. No evidence of dissection. Skeleton: Cervical spondylosis. No acute fracture or aggressive osseous lesion. Other neck: No neck mass or cervical lymphadenopathy. Upper chest: No consolidation within the imaged lung apices. Review of the MIP images confirms the above findings CTA HEAD FINDINGS Anterior circulation: The intracranial internal carotid arteries are patent. The M1 middle cerebral arteries are patent. No M2 proximal branch occlusion or high-grade proximal stenosis. The anterior cerebral arteries are patent. Hypoplastic left A1 segment. No intracranial aneurysm is identified. Posterior circulation: The intracranial vertebral arteries are patent. The basilar artery is patent. The posterior cerebral arteries are patent. Posterior communicating arteries are diminutive or absent, bilaterally. Venous sinuses: Within the limitations of contrast timing, no convincing thrombus. Anatomic variants: As described. Review of the MIP images confirms the above findings No emergent large vessel  occlusion identified. These results were communicated to Dr. Derry Lory At 11:20 amon 2/14/2025by text page via the [facility] messaging system. IMPRESSION: CTA neck: The common carotid, internal carotid and vertebral arteries are patent within the neck without stenosis or significant atherosclerotic disease. No evidence of dissection. CTA head: No proximal intracranial large vessel occlusion or high-grade proximal arterial stenosis identified. Electronically Signed   By: Jackey Loge D.O.   On: 08/19/2023 11:20   CT HEAD CODE STROKE WO CONTRAST Result Date: 08/19/2023 CLINICAL DATA:  Code stroke.  Stroke suspected EXAM: CT HEAD WITHOUT CONTRAST TECHNIQUE: Contiguous axial images were obtained from the base of the skull through  the vertex without intravenous contrast. RADIATION DOSE REDUCTION: This exam was performed according to the departmental dose-optimization program which includes automated exposure control, adjustment of the mA and/or kV according to patient size and/or use of iterative reconstruction technique. COMPARISON:  None Available. FINDINGS: Brain: No evidence of acute infarction, hemorrhage, hydrocephalus, extra-axial collection or mass lesion/mass effect. Vascular: No hyperdense vessel or unexpected calcification. Skull: Normal. Negative for fracture or focal lesion. Sinuses/Orbits: Middle ear or mastoid effusion. Paranasal are clear. Orbits are unremarkable. Other: None. ASPECTS [facility] Stroke Program Early CT Score): 10 IMPRESSION: No hemorrhage or CT evidence of an acute cortical infarct. Aspects is 10. Findings were paged to Dr. Derry Lory on 08/19/23 at 10:50 a.m. Electronically Signed   By: Lorenza Cambridge M.D.   On: 08/19/2023 10:51    Procedures Procedures    Medications Ordered in ED Medications  sodium chloride flush (NS) 0.9 % injection 3 mL (0 mLs Intravenous Hold 08/19/23 1100)  heparin ADULT infusion 100 units/mL (25000 units/252mL) (0 Units/hr Intravenous Stopped 08/19/23 1546)   albuterol (PROVENTIL) (2.5 MG/3ML) 0.083% nebulizer solution 3 mL (has no administration in time range)   stroke: early stages of recovery book (has no administration in time range)  acetaminophen (TYLENOL) tablet 650 mg (650 mg Oral Given 08/19/23 1533)    Or  acetaminophen (TYLENOL) 160 MG/5ML solution 650 mg ( Per Tube See Alternative 08/19/23 1533)    Or  acetaminophen (TYLENOL) suppository 650 mg ( Rectal See Alternative 08/19/23 1533)  senna-docusate (Senokot-S) tablet 1 tablet (has no administration in time range)  butalbital-acetaminophen-caffeine (FIORICET) 50-325-40 MG per tablet 2 tablet (has no administration in time range)  LORazepam (ATIVAN) injection 1 mg (has no administration in time range)  iohexol (OMNIPAQUE) 350 MG/ML injection 75 mL (75 mLs Intravenous Contrast Given 08/19/23 1059)  prochlorperazine (COMPAZINE) injection 10 mg (10 mg Intravenous Given 08/19/23 1315)  diphenhydrAMINE (BENADRYL) injection 25 mg (25 mg Intravenous Given 08/19/23 1315)    ED Course/ Medical Decision Making/ A&P                                  33 year old female with a history of chronic systolic CHF with ischemic cardiomyopathy 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  Differential diagnosis for dizziness includes CVA, ICH, cardiac arrhythmia, ectopic pregnancy, sepsis, anemia, electrolyte abnormalities, complicated migraine.  Called code stroke given numbness.  Neurology came to bedside for emergent evaluation, and she is taken to CT and has CT head and CTA head and neck.  The CTs were personally evaluated by me, radiology and neurology and showed no evidence of intracranial hemorrhage or evidence of large vessel occlusion.  No evidence of  intracranial aneurysm.  Her numbness resolved and felt improved at time of neurology evaluation.  Discussed with neurology, who recommends starting her on heparin drip, continuing neurochecks and obtaining MRI brain.  She has continued headache but no other return of neurologic symptoms.  At this time have low suspicion for occult subarachnoid hemorrhage in the setting of CT obtained within 6 hours, no vascular abnormalities.  She is at risk for CVA or TIA with history of heart failure, prior LV thrombus, and being off  of her anticoagulation.  Discussed with plan for patient to obtain MRI, admission and start heparin drip.    At this time MRI pending. Also ordered interrogation of her ICD which is pending.    Other labs were personally evaluated and interpreted by me showed negative pregnancy test, no clinically significant anemia or electrolyte abnormalities        Final Clinical Impression(s) / ED Diagnoses Final diagnoses:  Dizziness  Numbness and tingling in right hand  TIA (transient ischemic attack)  Acute nonintractable headache, unspecified headache type    Rx / DC Orders ED Discharge Orders     None         Alvira Monday, MD 08/19/23 1719

## 2023-08-23 ENCOUNTER — Other Ambulatory Visit (HOSPITAL_COMMUNITY): Payer: Medicaid Other

## 2023-08-24 ENCOUNTER — Ambulatory Visit (HOSPITAL_COMMUNITY)
Admission: RE | Admit: 2023-08-24 | Discharge: 2023-08-24 | Disposition: A | Discharge status: Home / Self Care | Payer: Medicaid Other | Source: Ambulatory Visit | Attending: Cardiology | Admitting: Cardiology

## 2023-08-24 DIAGNOSIS — I2510 Atherosclerotic heart disease of native coronary artery without angina pectoris: Secondary | ICD-10-CM | POA: Insufficient documentation

## 2023-08-24 DIAGNOSIS — I5022 Chronic systolic (congestive) heart failure: Secondary | ICD-10-CM | POA: Diagnosis not present

## 2023-08-24 LAB — LIPID PANEL
Cholesterol: 182 mg/dL (ref 0–200)
HDL: 52 mg/dL (ref >40)
LDL Cholesterol: 119 mg/dL — ABNORMAL HIGH (ref 0–99)
Total CHOL/HDL Ratio: 3.5 {ratio}
Triglycerides: 56 mg/dL (ref <150)
VLDL: 11 mg/dL (ref 0–40)

## 2023-08-24 LAB — HEPATIC FUNCTION PANEL
ALT: 16 U/L (ref 0–44)
AST: 17 U/L (ref 15–41)
Albumin: 3.8 g/dL (ref 3.5–5.0)
Alkaline Phosphatase: 31 U/L — ABNORMAL LOW (ref 38–126)
Bilirubin, Direct: 0.1 mg/dL (ref 0.0–0.2)
Indirect Bilirubin: 0.5 mg/dL (ref 0.3–0.9)
Total Bilirubin: 0.6 mg/dL (ref 0.0–1.2)
Total Protein: 7.3 g/dL (ref 6.5–8.1)

## 2023-09-16 NOTE — Addendum Note (Signed)
 Encounter addended by: Howell Rucks, RDCS on: 09/16/2023 1:29 PM  Actions taken: Imaging Exam ended

## 2023-09-20 ENCOUNTER — Telehealth (HOSPITAL_COMMUNITY): Payer: Self-pay

## 2023-09-20 NOTE — Telephone Encounter (Signed)
 Called to confirm/remind patient of their appointment [facility] on 09/21/23.   Appointment:   [x] Confirmed  [] Left mess   [] No answer/No voice mail  Patient reminded to bring all medications and/or complete list.  Confirmed patient has transportation. Gave directions, instructed to utilize valet parking.

## 2023-09-20 NOTE — Progress Notes (Signed)
 Date:  09/21/2023   ID:  PASHA GADISON, DOB Feb 11, 1991, MRN [id number]   Provider location:  Advanced Heart Failure Type of Visit: Established patient   PCP:  Grayce Sessions, NP  HF Cardiologist:  Dr. Shirlee Latch   History of Present Illness: Sheila Estes is a 33 y.o. with a history of history of obesity, anxiety, asthma, bipolar disorder, depression, and CAD, chronic systolic heart failure.  Admitted 2/23-09/04/18 with pulmonary edema. She had been short of breath for about a month, which started after drinking 2 Red Bulls. She had been seen at Urgent Care and [facility] and was treated for asthma and for PNA. Echo at 2/20 admission showed newly reduced EF 25-30% with peri-apical akinesis and apical thrombus. Transferred to [facility] and had LHC/RHC, which showed normal filling pressures and preserved cardiac output.  The proximal LAD was subtotally occluded, it looked like a SCAD event. Cardiac MRI suggested that LAD territory was infarcted and unlikely to be viable with restoration of perfusion. HF medications were optimized and she was set up with LifeVest for discharge. She was started on coumadin for LV thrombus. DC weight: 198 lbs.  Seen in the [facility] 02/2019. At that time she had self discontinued her Lifevest and had returned it. Echo was repeated at visit and EF remained severely reduced at 20-25% and RV was moderately reduced.   On coumadin for LV thrombus. INRs followed by [facility] coumadin clinic.   Echo 12/22 EF 25-30%, peri-apical akinesis, no LV thrombus, mildly decreased RV systolic function, normal IVC.   S/p [employer] ICD 2/23.  Echo 12/24 EF 20-25%, moderate LV dilation, no LV thrombus, normal RV, moderate PI, IVC normal.   Admitted 2/25 with stroke-like symptoms. CT head/neck with no acute abnormalities. She refused MRI and left AMA.  Today she returns for HF follow up. Overall feeling fine. Had a bad HA after last admission, and she attributes those  symptoms to missing her GDMT. Struggles to take evening-time meds. She feels BiDil causes UTIs. 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 Denies palpitations, abnormal bleeding, CP, dizziness, edema, or PND/Orthopnea. She has early satiety. No fever or chills. Does not drink or smoke. Taking all medications now, but has missed several over the past few weeks. Takes Lasix once a week.  ReDs reading: 40%, abnormal  [employer] ICD interrogation (personally reviewed from 09/17/22): HeartLogic Score 30, thoracic impedence elevated, average HR 78 bpm, 1.2 hr/day activity, no VT  ECG (personally reviewed): none ordered today  Labs (4/23): K 4.1, creatinine 0.78, LDL 74 Labs (8/24): K 3.8, creatinine 0.73, LDL 89, BNP 746 Labs (2/25): LDL 119, normal LFTs, K 3.6, creatinine 0.64  PMH: 1. Depression 2. Anxiety 3. Asthma 4. Chronic systolic CHF: Ischemic cardiomyopathy.   - Echo (2/20) with EF 25-30%, peri-apical akinesis, LV thrombus.  - LHC/RHC (2/20): Subtotal LAD occlusion after S1.  Appearance of LAD suggests SCAD.  Mean RA 1, PA 36/8 mean 23, mean PCWP 12, CI 2.46.  - Cardiac MRI (2/20): Mildly dilated LV with EF 27%, delayed enhancement images suggest a dense LAD-territory infarct (myocardium unlikely to be viable), LV apical thrombus, RV EF 31%.  - Echo (12/22): EF 25-30%, peri-apical akinesis, no LV thrombus, mildly decreased RV systolic function, normal IVC.  - s/p [employer] ICD 2/23. - Echo (12/24): EF 20-25%, moderate LV dilation, no LV thrombus, normal RV, moderate PI, IVC normal. 5. CAD: LHC (2/20) with subtotal LAD  occlusion after S1.  Appearance of LAD suggests SCAD.  Current Outpatient Medications  Medication Sig Dispense Refill   albuterol (VENTOLIN HFA) 108 (90 Base) MCG/ACT inhaler Inhale 2 puffs into the lungs every 6 (six) hours as needed for wheezing or shortness of breath. 1 each 2   apixaban (ELIQUIS) 5 MG TABS tablet Take 1 tablet (5 mg  total) by mouth 2 (two) times daily. 60 tablet 6   BIDIL 20-37.5 MG tablet Take 0.5 tablets by mouth 3 (three) times daily. 45 tablet 3   carvedilol (COREG) 25 MG tablet Take 1 tablet (25 mg total) by mouth 2 (two) times daily with a meal. 180 tablet 3   dapagliflozin propanediol (FARXIGA) 10 MG TABS tablet TAKE 1 TABLET(10 MG) BY MOUTH DAILY BEFORE BREAKFAST 90 tablet 3   furosemide (LASIX) 20 MG tablet Take 1 tablet (20 mg total) by mouth daily. (Patient taking differently: Take 20 mg by mouth daily as needed for fluid (swelling).) 180 tablet 3   potassium chloride SA (KLOR-CON M) 20 MEQ tablet Take 1 tablet (20 mEq total) by mouth daily. With Lasix (Patient taking differently: Take 20 mEq by mouth daily as needed (Lasix use).) 90 tablet 3   rosuvastatin (CRESTOR) 40 MG tablet Take 1 tablet (40 mg total) by mouth daily. (Patient taking differently: Take 40 mg by mouth at bedtime.) 90 tablet 3   sacubitril-valsartan (ENTRESTO) 97-103 MG TAKE 1 TABLET BY MOUTH TWICE DAILY 60 tablet 10   spironolactone (ALDACTONE) 25 MG tablet Take 1 tablet (25 mg total) by mouth daily. (Patient taking differently: Take 25 mg by mouth at bedtime.) 90 tablet 3   No current facility-administered medications for this encounter.    Allergies:   Gluten meal and Other   Social History:  The patient  reports that she has never smoked. She has never used smokeless tobacco. She reports current alcohol use. She reports that she does not use drugs.   Family History:  The patient's family history includes Diabetes in her maternal grandmother; Hypertension in her father, maternal grandfather, and mother.   ROS:  Please see the history of present illness.   All other systems are personally reviewed and negative.   Wt Readings from Last 3 Encounters:  09/21/23 100.2 kg (220 lb 12.8 oz)  08/19/23 97.5 kg (215 lb)  06/23/23 98.4 kg (217 lb)    BP 108/76   Pulse 79   Wt 100.2 kg (220 lb 12.8 oz)   SpO2 96%   BMI 41.72  kg/m  Physical Exam General:  NAD. No resp difficulty, walked into clinic HEENT: Normal Neck: Supple. Thick neck, JVP 8-10 Cor: Regular rate & rhythm. No rubs, gallops or murmurs. Lungs: Clear Abdomen: Soft, obese, nontender, nondistended.  Extremities: No cyanosis, clubbing, rash, trace LE edema Neuro: Alert & oriented x 3, moves all 4 extremities w/o difficulty. Affect pleasant.  ASSESSMENT AND PLAN: 1. 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  RHC in 2/20 showed normal filling pressures and preserved cardiac output.  Cardiac MRI in 2/20 suggested that LAD territory was infarcted and unlikely to be viable with restoration of perfusion. Repeat Echo 02/2019, EF 20-25% RV was moderately reduced.  Echo 12/22 EF still 25-30% with peri-apical akinesis.  S/p [employer] ICD implant 2/23.  Echo 12/24 showed  EF 20-25%, moderate LV dilation, no LV thrombus, normal RV, moderate PI, IVC normal.  NYHA  class I-II symptoms, she is volume overloaded. HL score elevated and weight is up. ReDs abnormal at 40% - Change Lasix to 40 mg daily, add 20 KCL daily. BMET and BNP today. - Continue Entresto 97/103 bid.   - Continue Farxiga 10 mg daily.  - Continue spironolactone 25 mg daily.  - Continue Coreg 25 mg bid.  - Continue Bidil 1/2 tab tid.  - Discussed importance of taking GDMT consisently and to notifiy clinic if experiencing side effects. - She needs to avoid pregnancy given teratogenic potentional of cardiac meds. She is not using contraception. She understands she need to contact HF clinic if she is gets pregnant so we can adjust HF medications. She would be high risk with pregnancy due to chronically low EF.  - LAD territory is unlikely to be viable based on MRI, so would not pursue CTO procedure or CABG.  - Referred to cardiac rehab but did not have transportation. She is agreeable to  this now, will re-refer at next visit. - Eventual CPX when we have an exercise tech.  - She may eventually need advanced therapies.  2. H/o LV thrombus: Previously on warfarin, now switched to Eliquis. No thrombus seen on most recent echo. 3. 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 No chest pain.  - On AC, so no ASA.  - Goal LDL < 70, continue Crestor 40 mg daily.  Lipids/LFTs today. - LAD territory is unlikely to be viable based on MRI, so would not pursue CTO procedure or CABG. 4. Obesity: Body mass index is 41.72 kg/m.  Working on weight loss.  - Insurance does not cover Ozempic or [id number]. 5. CVA vs TIA: Recent admission for CVA vs TIA work up. She declined MRI and left AMA, felt over-whelmed. She tells me her symptoms were " the worst HA of my life."No further events since admission. - I asked her to follow up with her PCP regarding MRI brain. ? Compliance with Eliquis.   Follow up in 3-4 weeks with APP for med & fluid check.  Signed, Jacklynn Ganong, FNP  09/21/2023  [facility] [address] [facility] [place] [address] [phone] (office) [phone] (fax)

## 2023-09-21 ENCOUNTER — Ambulatory Visit (INDEPENDENT_AMBULATORY_CARE_PROVIDER_SITE_OTHER): Payer: Commercial Managed Care - HMO

## 2023-09-21 ENCOUNTER — Encounter (HOSPITAL_COMMUNITY): Payer: Self-pay

## 2023-09-21 ENCOUNTER — Ambulatory Visit (HOSPITAL_COMMUNITY)
Admission: RE | Admit: 2023-09-21 | Discharge: 2023-09-21 | Disposition: A | Discharge status: Home / Self Care | Payer: Medicaid Other | Source: Ambulatory Visit | Attending: Family Medicine | Admitting: Family Medicine

## 2023-09-21 VITALS — BP 108/76 | HR 79 | Wt 220.8 lb

## 2023-09-21 DIAGNOSIS — I5022 Chronic systolic (congestive) heart failure: Secondary | ICD-10-CM | POA: Insufficient documentation

## 2023-09-21 DIAGNOSIS — I255 Ischemic cardiomyopathy: Secondary | ICD-10-CM

## 2023-09-21 DIAGNOSIS — Z79899 Other long term (current) drug therapy: Secondary | ICD-10-CM | POA: Diagnosis not present

## 2023-09-21 DIAGNOSIS — F319 Bipolar disorder, unspecified: Secondary | ICD-10-CM | POA: Diagnosis not present

## 2023-09-21 DIAGNOSIS — Z7901 Long term (current) use of anticoagulants: Secondary | ICD-10-CM | POA: Insufficient documentation

## 2023-09-21 DIAGNOSIS — E669 Obesity, unspecified: Secondary | ICD-10-CM | POA: Diagnosis not present

## 2023-09-21 DIAGNOSIS — R29818 Other symptoms and signs involving the nervous system: Secondary | ICD-10-CM

## 2023-09-21 DIAGNOSIS — I236 Thrombosis of atrium, auricular appendage, and ventricle as current complications following acute myocardial infarction: Secondary | ICD-10-CM | POA: Diagnosis not present

## 2023-09-21 DIAGNOSIS — I2510 Atherosclerotic heart disease of native coronary artery without angina pectoris: Secondary | ICD-10-CM | POA: Diagnosis not present

## 2023-09-21 DIAGNOSIS — Z6841 Body Mass Index (BMI) 40.0 and over, adult: Secondary | ICD-10-CM | POA: Diagnosis not present

## 2023-09-21 DIAGNOSIS — J45909 Unspecified asthma, uncomplicated: Secondary | ICD-10-CM | POA: Insufficient documentation

## 2023-09-21 LAB — CBC
HCT: 43.4 % (ref 36.0–46.0)
Hemoglobin: 15.2 g/dL — ABNORMAL HIGH (ref 12.0–15.0)
MCH: 32.8 pg (ref 26.0–34.0)
MCHC: 35 g/dL (ref 30.0–36.0)
MCV: 93.5 fL (ref 80.0–100.0)
Platelets: 301 10*3/uL (ref 150–400)
RBC: 4.64 MIL/uL (ref 3.87–5.11)
RDW: 12.3 % (ref 11.5–15.5)
WBC: 5.4 10*3/uL (ref 4.0–10.5)
nRBC: 0 % (ref 0.0–0.2)

## 2023-09-21 LAB — BASIC METABOLIC PANEL
Anion gap: 7 (ref 5–15)
BUN: 12 mg/dL (ref 6–20)
CO2: 26 mmol/L (ref 22–32)
Calcium: 9 mg/dL (ref 8.9–10.3)
Chloride: 104 mmol/L (ref 98–111)
Creatinine, Ser: 0.61 mg/dL (ref 0.44–1.00)
GFR, Estimated: >60 mL/min (ref >60)
Glucose, Bld: 98 mg/dL (ref 70–99)
Potassium: 3.7 mmol/L (ref 3.5–5.1)
Sodium: 137 mmol/L (ref 135–145)

## 2023-09-21 LAB — BRAIN NATRIURETIC PEPTIDE: B Natriuretic Peptide: 256.8 pg/mL — ABNORMAL HIGH (ref 0.0–100.0)

## 2023-09-21 MED ORDER — POTASSIUM CHLORIDE CRYS ER 20 MEQ PO TBCR: 20.0000 meq | EXTENDED_RELEASE_TABLET | Freq: Every day | ORAL | 3 refills | Status: AC

## 2023-09-21 MED ORDER — FUROSEMIDE 20 MG PO TABS: 40.0000 mg | ORAL_TABLET | Freq: Every day | ORAL | 3 refills | Status: AC

## 2023-09-21 NOTE — Progress Notes (Signed)
 ReDS Vest / Clip - 09/21/23 1200       ReDS Vest / Clip   Station Marker B    Ruler Value 33.5    ReDS Value Range Moderate volume overload    ReDS Actual Value 40

## 2023-09-21 NOTE — Patient Instructions (Signed)
 Medication Changes:  START Furosemide 40mg  (2 tabs) daily  START Potassium (1 tab) daily  Lab Work:  Labs done today, your results will be available in MyChart, we will contact you for abnormal readings.   Follow-Up in: Please follow up with the [facility] in 3-4 weeks. This appointment has been made for you.   At the [facility], you and your health needs are our priority. We have a designated team specialized in the treatment of Heart Failure. This Care Team includes your primary Heart Failure Specialized Cardiologist (physician), Advanced Practice Providers (APPs- Physician Assistants and Nurse Practitioners), and Pharmacist who all work together to provide you with the care you need, when you need it.   You may see any of the following providers on your designated Care Team at your next follow up:  Dr. Arvilla Meres Dr. Marca Ancona Dr. Dorthula Nettles Dr. Theresia Bough Tonye Becket, NP Robbie Lis, [place] [facility] [place], [place] Brynda Peon, NP [place] Lee, NP Karle Plumber, PharmD   Please be sure to bring in all your medications bottles to every appointment.   Need to Contact us:  If you have any questions or concerns before your next appointment please send [place] a message through [place] or call our office at [phone].    TO LEAVE A MESSAGE FOR THE NURSE SELECT OPTION 2, PLEASE LEAVE A MESSAGE INCLUDING: YOUR NAME DATE OF BIRTH CALL BACK NUMBER REASON FOR CALL**this is important as we prioritize the call backs  YOU WILL RECEIVE A CALL BACK THE SAME DAY AS LONG AS YOU CALL BEFORE 4:00 PM

## 2023-09-28 ENCOUNTER — Encounter: Payer: Self-pay | Admitting: Cardiology

## 2023-09-28 LAB — CUP PACEART REMOTE DEVICE CHECK
Battery Remaining Longevity: 162 mo
Battery Remaining Percentage: 100 %
Brady Statistic RV Percent Paced: 0 %
Date Time Interrogation Session: 20250321080200
HighPow Impedance: 68 Ohm
Implantable Lead Connection Status: 753985
Implantable Lead Implant Date: 20230222
Implantable Lead Location: 753860
Implantable Lead Model: 137
Implantable Lead Serial Number: 301096
Implantable Pulse Generator Implant Date: 20230222
Lead Channel Setting Pacing Amplitude: 2.5 V
Lead Channel Setting Pacing Pulse Width: 0.4 ms
Lead Channel Setting Sensing Sensitivity: 0.5 mV
Pulse Gen Serial Number: 215988

## 2023-10-15 ENCOUNTER — Encounter: Payer: Self-pay | Admitting: Internal Medicine

## 2023-10-18 ENCOUNTER — Telehealth (HOSPITAL_COMMUNITY): Payer: Self-pay | Admitting: *Deleted

## 2023-10-18 NOTE — Telephone Encounter (Signed)
 Called to confirm/remind patient of their appointment at the [facility] on 10/07/23***.   Appointment:   [x] Confirmed  [] Left mess   [] No answer/No voice mail  [] Phone not in service  Patient reminded to bring all medications and/or complete list.  Confirmed patient has transportation. Gave directions, instructed to utilize valet parking.

## 2023-10-18 NOTE — Telephone Encounter (Signed)
 Attempted to call patient and remind her of appointment tomorrow in HF APP Clinic. No answer, unable to leave message - no VM set up.

## 2023-10-19 ENCOUNTER — Encounter (HOSPITAL_COMMUNITY)

## 2023-10-26 ENCOUNTER — Other Ambulatory Visit (HOSPITAL_COMMUNITY): Payer: Self-pay | Admitting: Cardiology

## 2023-11-04 NOTE — Progress Notes (Signed)
 Remote ICD transmission.

## 2023-11-04 NOTE — Addendum Note (Signed)
 Addended by: Lott Rouleau A on: 11/04/2023 11:48 AM   Modules accepted: Orders

## 2023-12-21 ENCOUNTER — Ambulatory Visit (INDEPENDENT_AMBULATORY_CARE_PROVIDER_SITE_OTHER): Payer: Commercial Managed Care - HMO

## 2023-12-21 DIAGNOSIS — I255 Ischemic cardiomyopathy: Secondary | ICD-10-CM

## 2023-12-22 LAB — CUP PACEART REMOTE DEVICE CHECK
Battery Remaining Longevity: 156 mo
Battery Remaining Percentage: 100 %
Brady Statistic RV Percent Paced: 0 %
Date Time Interrogation Session: 20250618035100
HighPow Impedance: 72 Ohm
Implantable Lead Connection Status: 753985
Implantable Lead Implant Date: 20230222
Implantable Lead Location: 753860
Implantable Lead Model: 137
Implantable Lead Serial Number: 301096
Implantable Pulse Generator Implant Date: 20230222
Lead Channel Setting Pacing Amplitude: 2.5 V
Lead Channel Setting Pacing Pulse Width: 0.4 ms
Lead Channel Setting Sensing Sensitivity: 0.5 mV
Pulse Gen Serial Number: 215988

## 2023-12-23 ENCOUNTER — Ambulatory Visit: Payer: Self-pay | Admitting: Cardiology

## 2024-01-02 NOTE — Progress Notes (Signed)
 Electrophysiology Office Note:   Date:  01/03/2024  ID:  Sheila Estes, DOB 1991-02-12, MRN [id number]  Primary Cardiologist: None Electrophysiologist: Fonda Kitty, MD      History of Present Illness:   Sheila Estes is a 33 y.o. 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  Discussed the use of AI scribe software for clinical note transcription with the patient, who gave verbal consent to proceed.  History of Present Illness Sheila Estes is a 33 year old female with an implantable cardioverter defibrillator who presents for follow-up. She was previously under the care of Dr. Fernande, who is retiring, and is now being reassigned to a new provider. She has an implantable cardioverter defibrillator (ICD) and is enrolled in remote monitoring. There have been no issues with the device. She has not experienced any ICD therapies and has had no abnormal rhythms according to the device. She follows up with the heart failure clinic regularly and her medication regimen is stable and effective. She has not experienced any symptoms of fluid retention, such as shortness of breath or peripheral edema, and her breathing is fine. Overall, she feels well with no new symptoms or concerns.  Review of systems complete and found to be negative unless listed in HPI.   EP Information / Studies Reviewed:    EKG is ordered today. Personal review as below.  EKG Interpretation Date/Time:  Tuesday January 03 2024 10:23:59 EDT Ventricular Rate:  66 PR Interval:  164 QRS Duration:  88 QT Interval:  418 QTC Calculation: 438 R Axis:   -85  Text Interpretation: Normal sinus rhythm Possible Left atrial enlargement Left axis deviation Lateral infarct , age undetermined Inferior infarct , age undetermined When compared with ECG of 19-Aug-2023 11:07, No  significant change since Confirmed by Kitty Fonda [phone]) on 01/03/2024 10:33:52 AM   Echo 06/2023:   1. Left ventricular ejection fraction, by estimation, is 20 to 25%. The  left ventricle has severely decreased function. The left ventricle  demonstrates regional wall motion abnormalities (see scoring  diagram/findings for description). The left  ventricular internal cavity size was moderately dilated. Left ventricular  diastolic parameters are consistent with Grade III diastolic dysfunction  (restrictive).   2. Right ventricular systolic function is normal. The right ventricular  size is normal. There is moderately elevated pulmonary artery systolic  pressure. The estimated right ventricular systolic pressure is 47.6 mmHg.   3. Left atrial size was moderately dilated.   4. The mitral valve is normal in structure. No evidence of mitral valve  regurgitation. No evidence of mitral stenosis.   5. The aortic valve is normal in structure. Aortic valve regurgitation is  not visualized. No aortic stenosis is present.   6. Pulmonic valve regurgitation is moderate to severe.   7. The inferior vena cava is normal in size with greater than 50%  respiratory variability, suggesting right atrial pressure of 3 mmHg.    Physical Exam:   VS:  BP 110/64   Pulse 68   Ht 5' 1 (1.549 m)   Wt 220 lb (99.8 kg)   SpO2 99%   BMI 41.57 kg/m    Wt Readings from Last 3 Encounters:  01/03/24 220 lb (99.8 kg)  09/21/23 220 lb 12.8 oz (100.2 kg)  08/19/23 215 lb (97.5 kg)     GEN: Well nourished,  well developed in no acute distress NECK: No JVD CARDIAC: Normal rate, regular rhythm. Well healed left chest ICD pocket. RESPIRATORY:  Clear to auscultation without rales, wheezing or rhonchi  ABDOMEN: Soft, non-distended EXTREMITIES:  No edema; No deformity   ASSESSMENT AND PLAN:    #. S/p VVI ICD: Primary prevention. - In clinic device interrogation was performed today.  Appropriate device function and  stable lead parameters.  Presenting rhythm is ventricular sensed.  No arrhythmias or treated episodes.  Estimated longevity 13 years. - Continue remote monitoring.  #.  Chronic systolic heart failure: NYHA class II.  Well compensated on exam. #.  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  Continue close follow-up with our heart failure colleagues.  #. H/o LV thrombus:  - Eliquis  managed by our heart failure colleagues.  Follow up with Dr. Kennyth in 12 months  Signed, Fonda Kennyth, MD

## 2024-01-03 ENCOUNTER — Ambulatory Visit: Attending: Cardiology | Admitting: Cardiology

## 2024-01-03 ENCOUNTER — Encounter: Payer: Self-pay | Admitting: Cardiology

## 2024-01-03 VITALS — BP 110/64 | HR 68 | Ht 61.0 in | Wt 220.0 lb

## 2024-01-03 DIAGNOSIS — Z95810 Presence of automatic (implantable) cardiac defibrillator: Secondary | ICD-10-CM | POA: Insufficient documentation

## 2024-01-03 DIAGNOSIS — I5022 Chronic systolic (congestive) heart failure: Secondary | ICD-10-CM | POA: Insufficient documentation

## 2024-01-03 DIAGNOSIS — I255 Ischemic cardiomyopathy: Secondary | ICD-10-CM | POA: Diagnosis not present

## 2024-01-03 DIAGNOSIS — I2542 Coronary artery dissection: Secondary | ICD-10-CM | POA: Insufficient documentation

## 2024-01-03 NOTE — Patient Instructions (Signed)

## 2024-01-11 ENCOUNTER — Ambulatory Visit (HOSPITAL_COMMUNITY)
Admission: RE | Admit: 2024-01-11 | Discharge: 2024-01-11 | Disposition: A | Discharge status: Home / Self Care | Source: Ambulatory Visit | Attending: Family Medicine | Admitting: Family Medicine

## 2024-01-11 ENCOUNTER — Encounter (HOSPITAL_COMMUNITY): Payer: Self-pay

## 2024-01-11 ENCOUNTER — Ambulatory Visit (HOSPITAL_COMMUNITY): Payer: Self-pay | Admitting: Family Medicine

## 2024-01-11 VITALS — BP 106/70 | HR 71 | Wt 217.0 lb

## 2024-01-11 DIAGNOSIS — Z6841 Body Mass Index (BMI) 40.0 and over, adult: Secondary | ICD-10-CM | POA: Diagnosis not present

## 2024-01-11 DIAGNOSIS — J45909 Unspecified asthma, uncomplicated: Secondary | ICD-10-CM | POA: Insufficient documentation

## 2024-01-11 DIAGNOSIS — Z7901 Long term (current) use of anticoagulants: Secondary | ICD-10-CM | POA: Insufficient documentation

## 2024-01-11 DIAGNOSIS — Z79899 Other long term (current) drug therapy: Secondary | ICD-10-CM | POA: Insufficient documentation

## 2024-01-11 DIAGNOSIS — R29818 Other symptoms and signs involving the nervous system: Secondary | ICD-10-CM | POA: Diagnosis not present

## 2024-01-11 DIAGNOSIS — I236 Thrombosis of atrium, auricular appendage, and ventricle as current complications following acute myocardial infarction: Secondary | ICD-10-CM | POA: Diagnosis not present

## 2024-01-11 DIAGNOSIS — E669 Obesity, unspecified: Secondary | ICD-10-CM | POA: Insufficient documentation

## 2024-01-11 DIAGNOSIS — R200 Anesthesia of skin: Secondary | ICD-10-CM | POA: Insufficient documentation

## 2024-01-11 DIAGNOSIS — Z7984 Long term (current) use of oral hypoglycemic drugs: Secondary | ICD-10-CM | POA: Diagnosis not present

## 2024-01-11 DIAGNOSIS — I255 Ischemic cardiomyopathy: Secondary | ICD-10-CM | POA: Insufficient documentation

## 2024-01-11 DIAGNOSIS — F419 Anxiety disorder, unspecified: Secondary | ICD-10-CM | POA: Diagnosis not present

## 2024-01-11 DIAGNOSIS — I5022 Chronic systolic (congestive) heart failure: Secondary | ICD-10-CM | POA: Diagnosis not present

## 2024-01-11 DIAGNOSIS — I2510 Atherosclerotic heart disease of native coronary artery without angina pectoris: Secondary | ICD-10-CM | POA: Insufficient documentation

## 2024-01-11 DIAGNOSIS — F319 Bipolar disorder, unspecified: Secondary | ICD-10-CM | POA: Diagnosis not present

## 2024-01-11 DIAGNOSIS — Z86718 Personal history of other venous thrombosis and embolism: Secondary | ICD-10-CM | POA: Diagnosis not present

## 2024-01-11 LAB — BASIC METABOLIC PANEL WITH GFR
Anion gap: 9 (ref 5–15)
BUN: 10 mg/dL (ref 6–20)
CO2: 22 mmol/L (ref 22–32)
Calcium: 9.4 mg/dL (ref 8.9–10.3)
Chloride: 108 mmol/L (ref 98–111)
Creatinine, Ser: 0.82 mg/dL (ref 0.44–1.00)
GFR, Estimated: >60 mL/min (ref >60)
Glucose, Bld: 104 mg/dL — ABNORMAL HIGH (ref 70–99)
Potassium: 3.8 mmol/L (ref 3.5–5.1)
Sodium: 139 mmol/L (ref 135–145)

## 2024-01-11 LAB — BRAIN NATRIURETIC PEPTIDE: B Natriuretic Peptide: 195.9 pg/mL — ABNORMAL HIGH (ref 0.0–100.0)

## 2024-01-11 NOTE — Progress Notes (Signed)
 Date:  01/11/2024   ID:  Sheila Estes, DOB 11-14-1990, MRN [id number]   Provider location: [facility] Type of Visit: Established patient   PCP:  Sheila Rosaline SQUIBB, NP  HF Cardiologist:  Dr. Rolan   History of Present Illness: Sheila Estes is a 33 y.o. with a history of history of obesity, anxiety, asthma, bipolar disorder, depression, and CAD, chronic systolic heart failure.  Admitted 2/23-09/04/18 with pulmonary edema. She had been short of breath for about a month, which started after drinking 2 Red Bulls. She had been seen at Urgent Care and [facility] and was treated for asthma and for PNA. Echo at 2/20 admission showed newly reduced EF 25-30% with peri-apical akinesis and apical thrombus. Transferred to [facility] and had LHC/RHC, which showed normal filling pressures and preserved cardiac output.  The proximal LAD was subtotally occluded, it looked like a SCAD event. Cardiac MRI suggested that LAD territory was infarcted and unlikely to be viable with restoration of perfusion. HF medications were optimized and she was set up with LifeVest for discharge. She was started on coumadin  for LV thrombus. DC weight: 198 lbs.  Seen in the [facility] 02/2019. At that time she had self discontinued her Lifevest and had returned it. Echo was repeated at visit and EF remained severely reduced at 20-25% and RV was moderately reduced.   On coumadin  for LV thrombus. INRs followed by [facility] coumadin  clinic.   Echo 12/22 EF 25-30%, peri-apical akinesis, no LV thrombus, mildly decreased RV systolic function, normal IVC.   S/p [employer] ICD 2/23.  Echo 12/24 EF 20-25%, moderate LV dilation, no LV thrombus, normal RV, moderate PI, IVC normal.   Admitted 2/25 with stroke-like symptoms. CT head/neck with no acute abnormalities. She refused MRI and left AMA.  Today she returns for HF follow up. Overall feeling fine. Main complaint if left big numb x 1 week. She is worried it could  be clot. Occasionally has lower abdominal pain. She has SOB walking up steps quickly. Denies palpitations, abnormal bleeding, CP, dizziness, edema, or PND/Orthopnea. Appetite ok. She does not weigh at home. Taking all medications, has been missing PM doses but trying to do better. No ETOH, drug or tobacco use. Not using contraception.  [employer] ICD interrogation (personally reviewed): HL score 7, thoracic impedence ok,no VT, average HR 70 bpm, 1.6 hr/day activity.  ECG (personally reviewed): none ordered today.  Labs (4/23): K 4.1, creatinine 0.78, LDL 74 Labs (8/24): K 3.8, creatinine 0.73, LDL 89, BNP 746 Labs (2/25): LDL 119, normal LFTs, K 3.6, creatinine 0.64 Labs (3/25): K 3.7, creatinine 0.61   PMH: 1. Depression 2. Anxiety 3. Asthma 4. Chronic systolic CHF: Ischemic cardiomyopathy.   - Echo (2/20) with EF 25-30%, peri-apical akinesis, LV thrombus.  - LHC/RHC (2/20): Subtotal LAD occlusion after S1.  Appearance of LAD suggests SCAD.  Mean RA 1, PA 36/8 mean 23, mean PCWP 12, CI 2.46.  - Cardiac MRI (2/20): Mildly dilated LV with EF 27%, delayed enhancement images suggest a dense LAD-territory infarct (myocardium unlikely to be viable), LV apical thrombus, RV EF 31%.  - Echo (12/22): EF 25-30%, peri-apical akinesis, no LV thrombus, mildly decreased RV systolic function, normal IVC.  - s/p [employer] ICD 2/23. - Echo (12/24): EF 20-25%, moderate LV dilation, no LV thrombus, normal RV, moderate PI, IVC normal. 5. CAD: LHC (2/20) with subtotal LAD occlusion after S1.  Appearance of LAD suggests SCAD.  Current  Outpatient Medications  Medication Sig Dispense Refill   albuterol  (VENTOLIN  HFA) 108 (90 Base) MCG/ACT inhaler Inhale 2 puffs into the lungs every 6 (six) hours as needed for wheezing or shortness of breath. 1 each 2   apixaban  (ELIQUIS ) 5 MG TABS tablet Take 1 tablet (5 mg total) by mouth 2 (two) times daily. 60 tablet 6   BIDIL  20-37.5 MG tablet TAKE 1/2 TABLET  BY MOUTH THREE TIMES DAILY 45 tablet 1   carvedilol  (COREG ) 25 MG tablet Take 1 tablet (25 mg total) by mouth 2 (two) times daily with a meal. 180 tablet 3   dapagliflozin  propanediol (FARXIGA ) 10 MG TABS tablet TAKE 1 TABLET(10 MG) BY MOUTH DAILY BEFORE BREAKFAST 90 tablet 3   furosemide  (LASIX ) 20 MG tablet Take 2 tablets (40 mg total) by mouth daily. 60 tablet 3   potassium chloride  SA (KLOR-CON  M) 20 MEQ tablet Take 1 tablet (20 mEq total) by mouth daily. With Lasix  90 tablet 3   rosuvastatin  (CRESTOR ) 40 MG tablet Take 1 tablet (40 mg total) by mouth daily. 90 tablet 3   sacubitril -valsartan  (ENTRESTO ) 97-103 MG TAKE 1 TABLET BY MOUTH TWICE DAILY 60 tablet 10   spironolactone  (ALDACTONE ) 25 MG tablet Take 1 tablet (25 mg total) by mouth daily. 90 tablet 3   No current facility-administered medications for this encounter.   Allergies:   Gluten meal and Other   Social History:  The patient  reports that she has never smoked. She has never used smokeless tobacco. She reports current alcohol use. She reports that she does not use drugs.   Family History:  The patient's family history includes Diabetes in her maternal grandmother; Hypertension in her father, maternal grandfather, and mother.   ROS:  Please see the history of present illness.   All other systems are personally reviewed and negative.   Wt Readings from Last 3 Encounters:  01/11/24 98.4 kg (217 lb)  01/03/24 99.8 kg (220 lb)  09/21/23 100.2 kg (220 lb 12.8 oz)    BP 106/70   Pulse 71   Wt 98.4 kg (217 lb)   SpO2 97%   BMI 41.00 kg/m  Physical Exam General:  NAD. No resp difficulty, walked into clinic HEENT: Normal Neck: Supple. No JVD. Cor: Regular rate & rhythm. No rubs, gallops or murmurs. Lungs: Clear Abdomen: Soft, obese, nontender, nondistended.  Extremities: No cyanosis, clubbing, rash, edema Neuro: Alert & oriented x 3, moves all 4 extremities w/o difficulty. Affect pleasant.  ASSESSMENT AND PLAN: 1.  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  RHC in 2/20 showed normal filling pressures and preserved cardiac output.  Cardiac MRI in 2/20 suggested that LAD territory was infarcted and unlikely to be viable with restoration of perfusion. Repeat Echo 02/2019, EF 20-25% RV was moderately reduced.  Echo 12/22 EF still 25-30% with peri-apical akinesis.  S/p [employer] ICD implant 2/23.  Echo 12/24 showed  EF 20-25%, moderate LV dilation, no LV thrombus, normal RV, moderate PI, IVC normal.  NYHA class I-II symptoms, she is not volume overloaded by exam nor device interrogation. - Continue Lasix  40 mg daily + 20 KCL daily. BMET and BNP today. - Continue Entresto  97/103 bid.   - Continue Farxiga  10 mg daily.  - Continue spironolactone  25 mg daily.  - Continue Coreg  25 mg bid.  - Continue Bidil  1/2 tab tid.  - Discussed importance of  taking GDMT consisently and to notifiy clinic if experiencing side effects. - She needs to avoid pregnancy given teratogenic potentional of cardiac meds. She is not using contraception. She understands she need to contact HF clinic if she is gets pregnant so we can adjust HF medications. She would be high risk with pregnancy due to chronically low EF.  - LAD territory is unlikely to be viable based on MRI, so would not pursue CTO procedure or CABG.  - Referred to cardiac rehab, she is agreeable to go and will call and arrange. - Discussed CPX today, she declines. - She may eventually need advanced therapies.  2. H/o LV thrombus: Previously on warfarin, now switched to Eliquis . No thrombus seen on most recent echo. 3. 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 No chest pain.  - On AC, so no ASA.  - Goal LDL < 70, continue Crestor  40 mg daily. She has not been taking faithfully, so will repeat lipids at next visit. - LAD territory  is unlikely to be viable based on MRI, so would not pursue CTO procedure or CABG. 4. Obesity: Body mass index is 41 kg/m.  Working on weight loss.  - Insurance does not cover Ozempic or [id number]. 5. CVA vs TIA: Recent admission for CVA vs TIA work up. She declined MRI and left AMA, felt over-whelmed. She tells me her symptoms were  the worst HA of my life.No further events since admission. - I asked her to follow up with her PCP regarding MRI brain. ? Compliance with Eliquis . 6. Toe numbness: DP/PT ok, no trauma or swelling to area. Wears open-toed shoes. - I asked her to follow up with PCP  Follow up in 3 months with Dr. Rolan   Signed, Harlene CHRISTELLA Gainer, FNP  01/11/2024  [facility] [address] [facility] [place] [address] [phone] (office) [phone] (fax)

## 2024-01-11 NOTE — Patient Instructions (Addendum)
 Thank you for coming in today  If you had labs drawn today, any labs that are abnormal the clinic will call you No news is good news  Please call your Primary Care Provider for your Left Great Toe Pain  Call Cardiac Rehab  Medications: Take Crestor  in the morning   Follow up appointments:  Your physician recommends that you schedule a follow-up appointment in:  3 months With Dr. Rolan Please call our office to schedule the follow-up appointment in August for October 2025.    Do the following things EVERYDAY: Weigh yourself in the morning before breakfast. Write it down and keep it in a log. Take your medicines as prescribed Eat low salt foods--Limit salt (sodium) to 2000 mg per day.  Stay as active as you can everyday Limit all fluids for the day to less than 2 liters   At the [facility], you and your health needs are our priority. As part of our continuing mission to provide you with exceptional heart care, we have created designated Provider Care Teams. These Care Teams include your primary Cardiologist (physician) and Advanced Practice Providers (APPs- Physician Assistants and Nurse Practitioners) who all work together to provide you with the care you need, when you need it.   You may see any of the following providers on your designated Care Team at your next follow up: Dr Toribio Fuel Dr Ezra Rolan Dr. Ria Gardenia Greig Lenetta, NP Caffie Shed, [place] [facility] [place], [place] Beckey Coe, NP Tinnie Redman, PharmD   Please be sure to bring in all your medications bottles to every appointment.    Thank you for choosing [facility]  If you have any questions or concerns before your next appointment please send us  a message through [place] or call our office at [phone].    TO LEAVE A MESSAGE FOR THE NURSE SELECT OPTION 2, PLEASE LEAVE A MESSAGE INCLUDING: YOUR NAME DATE OF BIRTH CALL  BACK NUMBER REASON FOR CALL**this is important as we prioritize the call backs  YOU WILL RECEIVE A CALL BACK THE SAME DAY AS LONG AS YOU CALL BEFORE 4:00 PM

## 2024-01-12 DIAGNOSIS — F411 Generalized anxiety disorder: Secondary | ICD-10-CM | POA: Diagnosis not present

## 2024-01-12 DIAGNOSIS — F4321 Adjustment disorder with depressed mood: Secondary | ICD-10-CM | POA: Diagnosis not present

## 2024-01-12 LAB — HEMOGLOBIN A1C
Hgb A1c MFr Bld: 5.1 % (ref 4.8–5.6)
Mean Plasma Glucose: 100 mg/dL

## 2024-03-01 NOTE — Addendum Note (Signed)
 Addended by: VICCI SELLER A on: 03/01/2024 08:23 AM   Modules accepted: Orders

## 2024-03-01 NOTE — Progress Notes (Signed)
 Remote ICD transmission.

## 2024-03-03 ENCOUNTER — Other Ambulatory Visit (HOSPITAL_COMMUNITY): Payer: Self-pay | Admitting: Cardiology

## 2024-03-07 DIAGNOSIS — F411 Generalized anxiety disorder: Secondary | ICD-10-CM | POA: Diagnosis not present

## 2024-03-07 DIAGNOSIS — F4321 Adjustment disorder with depressed mood: Secondary | ICD-10-CM | POA: Diagnosis not present

## 2024-03-08 ENCOUNTER — Other Ambulatory Visit (HOSPITAL_COMMUNITY): Payer: Self-pay | Admitting: Cardiology

## 2024-03-08 MED ORDER — BIDIL 20-37.5 MG PO TABS: 0.5000 | ORAL_TABLET | Freq: Three times a day (TID) | ORAL | 1 refills | Status: DC

## 2024-03-21 ENCOUNTER — Ambulatory Visit (INDEPENDENT_AMBULATORY_CARE_PROVIDER_SITE_OTHER): Payer: Self-pay

## 2024-03-21 DIAGNOSIS — I255 Ischemic cardiomyopathy: Secondary | ICD-10-CM

## 2024-03-21 LAB — CUP PACEART REMOTE DEVICE CHECK
Battery Remaining Longevity: 156 mo
Battery Remaining Percentage: 100 %
Brady Statistic RV Percent Paced: 0 %
Date Time Interrogation Session: 20250917053600
HighPow Impedance: 67 Ohm
Implantable Lead Connection Status: 753985
Implantable Lead Implant Date: 20230222
Implantable Lead Location: 753860
Implantable Lead Model: 137
Implantable Lead Serial Number: 301096
Implantable Pulse Generator Implant Date: 20230222
Lead Channel Setting Pacing Amplitude: 2.5 V
Lead Channel Setting Pacing Pulse Width: 0.4 ms
Lead Channel Setting Sensing Sensitivity: 0.5 mV
Pulse Gen Serial Number: 215988

## 2024-03-26 ENCOUNTER — Ambulatory Visit: Payer: Self-pay | Admitting: Cardiology

## 2024-03-26 ENCOUNTER — Other Ambulatory Visit (HOSPITAL_COMMUNITY): Payer: Self-pay | Admitting: Family Medicine

## 2024-03-26 DIAGNOSIS — F411 Generalized anxiety disorder: Secondary | ICD-10-CM | POA: Diagnosis not present

## 2024-03-26 NOTE — Progress Notes (Signed)
Remote ICD Transmission.

## 2024-03-27 ENCOUNTER — Other Ambulatory Visit (HOSPITAL_COMMUNITY): Payer: Self-pay

## 2024-03-27 DIAGNOSIS — I5022 Chronic systolic (congestive) heart failure: Secondary | ICD-10-CM

## 2024-03-27 MED ORDER — CARVEDILOL 25 MG PO TABS: 25.0000 mg | ORAL_TABLET | Freq: Two times a day (BID) | ORAL | 3 refills | Status: AC

## 2024-03-30 ENCOUNTER — Other Ambulatory Visit (HOSPITAL_COMMUNITY): Payer: Self-pay

## 2024-04-13 ENCOUNTER — Encounter (HOSPITAL_COMMUNITY): Admitting: Cardiology

## 2024-05-02 ENCOUNTER — Encounter (HOSPITAL_COMMUNITY): Payer: Self-pay | Admitting: Cardiology

## 2024-05-02 ENCOUNTER — Ambulatory Visit (HOSPITAL_COMMUNITY)
Admission: RE | Admit: 2024-05-02 | Discharge: 2024-05-02 | Disposition: A | Discharge status: Home / Self Care | Source: Ambulatory Visit | Attending: Cardiology | Admitting: Cardiology

## 2024-05-02 ENCOUNTER — Ambulatory Visit (HOSPITAL_COMMUNITY): Payer: Self-pay | Admitting: Cardiology

## 2024-05-02 VITALS — BP 130/88 | HR 84 | Ht 61.0 in | Wt 220.4 lb

## 2024-05-02 DIAGNOSIS — Z7901 Long term (current) use of anticoagulants: Secondary | ICD-10-CM | POA: Diagnosis not present

## 2024-05-02 DIAGNOSIS — Z7984 Long term (current) use of oral hypoglycemic drugs: Secondary | ICD-10-CM | POA: Insufficient documentation

## 2024-05-02 DIAGNOSIS — I255 Ischemic cardiomyopathy: Secondary | ICD-10-CM | POA: Diagnosis not present

## 2024-05-02 DIAGNOSIS — I5022 Chronic systolic (congestive) heart failure: Secondary | ICD-10-CM | POA: Diagnosis not present

## 2024-05-02 DIAGNOSIS — E669 Obesity, unspecified: Secondary | ICD-10-CM | POA: Diagnosis not present

## 2024-05-02 DIAGNOSIS — Z79899 Other long term (current) drug therapy: Secondary | ICD-10-CM | POA: Insufficient documentation

## 2024-05-02 DIAGNOSIS — Z86718 Personal history of other venous thrombosis and embolism: Secondary | ICD-10-CM | POA: Diagnosis not present

## 2024-05-02 DIAGNOSIS — J45909 Unspecified asthma, uncomplicated: Secondary | ICD-10-CM | POA: Insufficient documentation

## 2024-05-02 DIAGNOSIS — Z6841 Body Mass Index (BMI) 40.0 and over, adult: Secondary | ICD-10-CM | POA: Insufficient documentation

## 2024-05-02 DIAGNOSIS — Z95810 Presence of automatic (implantable) cardiac defibrillator: Secondary | ICD-10-CM | POA: Diagnosis not present

## 2024-05-02 DIAGNOSIS — I2510 Atherosclerotic heart disease of native coronary artery without angina pectoris: Secondary | ICD-10-CM | POA: Insufficient documentation

## 2024-05-02 DIAGNOSIS — F319 Bipolar disorder, unspecified: Secondary | ICD-10-CM | POA: Insufficient documentation

## 2024-05-02 LAB — CBC
HCT: 43 % (ref 36.0–46.0)
Hemoglobin: 15.1 g/dL — ABNORMAL HIGH (ref 12.0–15.0)
MCH: 32.4 pg (ref 26.0–34.0)
MCHC: 35.1 g/dL (ref 30.0–36.0)
MCV: 92.3 fL (ref 80.0–100.0)
Platelets: 318 K/uL (ref 150–400)
RBC: 4.66 MIL/uL (ref 3.87–5.11)
RDW: 13.6 % (ref 11.5–15.5)
WBC: 7.1 K/uL (ref 4.0–10.5)
nRBC: 0 % (ref 0.0–0.2)

## 2024-05-02 LAB — BASIC METABOLIC PANEL WITH GFR
Anion gap: 9 (ref 5–15)
BUN: 13 mg/dL (ref 6–20)
CO2: 22 mmol/L (ref 22–32)
Calcium: 8.9 mg/dL (ref 8.9–10.3)
Chloride: 106 mmol/L (ref 98–111)
Creatinine, Ser: 0.67 mg/dL (ref 0.44–1.00)
GFR, Estimated: >60 mL/min (ref >60)
Glucose, Bld: 92 mg/dL (ref 70–99)
Potassium: 3.6 mmol/L (ref 3.5–5.1)
Sodium: 137 mmol/L (ref 135–145)

## 2024-05-02 LAB — BRAIN NATRIURETIC PEPTIDE: B Natriuretic Peptide: 274 pg/mL — ABNORMAL HIGH (ref 0.0–100.0)

## 2024-05-02 MED ORDER — BIDIL 20-37.5 MG PO TABS: 1.0000 | ORAL_TABLET | Freq: Three times a day (TID) | ORAL | 5 refills | Status: AC

## 2024-05-02 NOTE — Patient Instructions (Signed)
 Medication Changes:  INCREASE BIDIL  TO 1 TABLET THREE TIMES DAILY   Lab Work:  Labs done today, your results will be available in MyChart, we will contact you for abnormal readings.  Testing/Procedures:  ECHOCARDIOGRAM AS SCHEDULED   Referrals:  YOU HAVE BEEN REFERRED TO PHARMACY CLINIC THEY WILL REACH OUT TO YOU OR CALL TO ARRANGE THIS. PLEASE CALL US  WITH ANY CONCERNS   Follow-Up in: 4 MONTHS WITH APP AS SCHEDULED   At the [facility], you and your health needs are our priority. We have a designated team specialized in the treatment of Heart Failure. This Care Team includes your primary Heart Failure Specialized Cardiologist (physician), Advanced Practice Providers (APPs- Physician Assistants and Nurse Practitioners), and Pharmacist who all work together to provide you with the care you need, when you need it.   You may see any of the following providers on your designated Care Team at your next follow up:  Dr. Toribio Fuel Dr. Ezra Shuck Dr. Odis Brownie Greig Mosses, NP Caffie Shed, [place] [facility], [place] Beckey Coe, NP Jordan Lee, NP Tinnie Redman, PharmD   Please be sure to bring in all your medications bottles to every appointment.   Need to Contact Us :  If you have any questions or concerns before your next appointment please send us  a message through [place] or call our office at [phone].    TO LEAVE A MESSAGE FOR THE NURSE SELECT OPTION 2, PLEASE LEAVE A MESSAGE INCLUDING: YOUR NAME DATE OF BIRTH CALL BACK NUMBER REASON FOR CALL**this is important as we prioritize the call backs  YOU WILL RECEIVE A CALL BACK THE SAME DAY AS LONG AS YOU CALL BEFORE 4:00 PM

## 2024-05-02 NOTE — Progress Notes (Signed)
 Date:  05/02/2024   ID:  Sheila Estes, DOB [birth year], MRN [id number]   Provider location:  [facility] Type of Visit: Established patient   PCP:  Celestia Rosaline SQUIBB, NP  HF Cardiologist:  Dr. Rolan  Chief complaint: CHF   History of Present Illness: Ms Sheila Estes is a 33 y.o. with a history of history of obesity, anxiety, asthma, bipolar disorder, depression, and CAD, chronic systolic heart failure.  Admitted 2/23-09/04/18 with pulmonary edema. She had been short of breath for about a month, which started after drinking 2 Red Bulls. She had been seen at [facility]gent [facility] and [facility] and was treated for asthma and for PNA. Echo at 2/20 admission showed newly reduced EF 25-30% with peri-apical akinesis and apical thrombus. Transferred to [facility] and had LHC/RHC, which showed normal filling pressures and preserved cardiac output.  The proximal LAD was subtotally occluded, it looked like a SCAD event. Cardiac MRI suggested that LAD territory was infarcted and unlikely to be viable with restoration of perfusion. HF medications were optimized and she was set up with LifeVest for discharge. She was started on coumadin  for LV thrombus. DC weight: 198 lbs.  Seen in the [facility] 02/2019. At that time she had self discontinued her Lifevest and had returned it. Echo was repeated at visit and EF remained severely reduced at 20-25% and RV was moderately reduced.   On coumadin  for LV thrombus. INRs followed by [facility] coumadin  clinic.   Echo 12/22 EF 25-30%, peri-apical akinesis, no LV thrombus, mildly decreased RV systolic function, normal IVC.   S/p [employer] ICD 2/23.  Echo 12/24 EF 20-25%, moderate LV dilation, no LV thrombus, normal RV, moderate PI, IVC normal.   Admitted 2/25 with stroke-like symptoms. CT head/neck with no acute abnormalities. She refused MRI and left AMA.  Today she returns for HF follow up. Weight up about 3 lbs.  No significant exertional dyspnea, she  is able to walk up 2 flights of stairs to her 3rd floor apartment without difficulty. No chest pain. No lightheadedness.  No orthopnea/PND.   [employer] ICD interrogation (personally reviewed): HL score 0, no VT.   ECG (personally reviewed): NSR, old ALMI  Labs (4/23): K 4.1, creatinine 0.78, LDL 74 Labs (8/24): K 3.8, creatinine 0.73, LDL 89, BNP 746 Labs (2/25): LDL 119, normal LFTs, K 3.6, creatinine 0.64 Labs (3/25): K 3.7, creatinine 0.61  Labs (7/25): K 3.8, creatinine 0.82, BNP 196  PMH: 1. Depression 2. Anxiety 3. Asthma 4. Chronic systolic CHF: Ischemic cardiomyopathy.   - Echo (2/20) with EF 25-30%, peri-apical akinesis, LV thrombus.  - LHC/RHC (2/20): Subtotal LAD occlusion after S1.  Appearance of LAD suggests SCAD.  Mean RA 1, PA 36/8 mean 23, mean PCWP 12, CI 2.46.  - Cardiac MRI (2/20): Mildly dilated LV with EF 27%, delayed enhancement images suggest a dense LAD-territory infarct (myocardium unlikely to be viable), LV apical thrombus, RV EF 31%.  - Echo (12/22): EF 25-30%, peri-apical akinesis, no LV thrombus, mildly decreased RV systolic function, normal IVC.  - s/p [employer] ICD 2/23. - Echo (12/24): EF 20-25%, moderate LV dilation, no LV thrombus, normal RV, moderate PI, IVC normal. 5. CAD: LHC (2/20) with subtotal LAD occlusion after S1.  Appearance of LAD suggests SCAD.  Current Outpatient Medications  Medication Sig Dispense Refill   albuterol  (VENTOLIN  HFA) 108 (90 Base) MCG/ACT inhaler Inhale 2 puffs into the lungs every 6 (six) hours as needed for  wheezing or shortness of breath. 1 each 2   apixaban  (ELIQUIS ) 5 MG TABS tablet TAKE 1 TABLET(5 MG) BY MOUTH TWICE DAILY 180 tablet 3   carvedilol  (COREG ) 25 MG tablet Take 1 tablet (25 mg total) by mouth 2 (two) times daily with a meal. 180 tablet 3   dapagliflozin  propanediol (FARXIGA ) 10 MG TABS tablet TAKE 1 TABLET(10 MG) BY MOUTH DAILY BEFORE BREAKFAST 90 tablet 3   furosemide  (LASIX ) 20 MG tablet  Take 2 tablets (40 mg total) by mouth daily. 60 tablet 3   potassium chloride  SA (KLOR-CON  M) 20 MEQ tablet Take 1 tablet (20 mEq total) by mouth daily. With Lasix  90 tablet 3   rosuvastatin  (CRESTOR ) 40 MG tablet Take 1 tablet (40 mg total) by mouth daily. 90 tablet 3   sacubitril -valsartan  (ENTRESTO ) 97-103 MG TAKE 1 TABLET BY MOUTH TWICE DAILY 60 tablet 10   spironolactone  (ALDACTONE ) 25 MG tablet Take 1 tablet (25 mg total) by mouth daily. 90 tablet 3   BIDIL  20-37.5 MG tablet Take 1 tablet by mouth 3 (three) times daily. 90 tablet 5   No current facility-administered medications for this encounter.   Allergies:   Gluten meal and Other   Social History:  The patient  reports that she has never smoked. She has never used smokeless tobacco. She reports current alcohol use. She reports that she does not use drugs.   Family History:  The patient's family history includes Diabetes in her maternal grandmother; Hypertension in her father, maternal grandfather, and mother.   ROS:  Please see the history of present illness.   All other systems are personally reviewed and negative.   Wt Readings from Last 3 Encounters:  05/02/24 100 kg (220 lb 6.4 oz)  01/11/24 98.4 kg (217 lb)  01/03/24 99.8 kg (220 lb)    BP 130/88   Pulse 84   Ht 5' 1 (1.549 m)   Wt 100 kg (220 lb 6.4 oz)   SpO2 97%   BMI 41.64 kg/m  General: NAD Neck: No JVD, no thyromegaly or thyroid nodule.  Lungs: Clear to auscultation bilaterally with normal respiratory effort. CV: Nondisplaced PMI.  Heart regular S1/S2, no S3/S4, no murmur.  No peripheral edema.  No carotid bruit.  Normal pedal pulses.  Abdomen: Soft, nontender, no hepatosplenomegaly, no distention.  Skin: Intact without lesions or rashes.  Neurologic: Alert and oriented x 3.  Psych: Normal affect. Extremities: No clubbing or cyanosis.  HEENT: Normal.   ASSESSMENT AND PLAN: 1. 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  RHC in 2/20 showed normal filling pressures and preserved cardiac output.  Cardiac MRI in 2/20 suggested that LAD territory was infarcted and unlikely to be viable with restoration of perfusion. Repeat Echo 02/2019, EF 20-25% RV was moderately reduced.  Echo 12/22 EF still 25-30% with peri-apical akinesis.  S/p [employer] ICD implant 2/23.  Echo 12/24 showed  EF 20-25%, moderate LV dilation, no LV thrombus, normal RV, moderate PI, IVC normal.  NYHA class I symptoms, not volume overloaded by exam or Optivol.  - Continue Lasix  40 mg daily + 20 KCL daily. BMET and BNP today. - Continue Entresto  97/103 bid.   - Continue Farxiga  10 mg daily.  - Continue spironolactone  25 mg daily.  - Continue Coreg  25 mg bid.  - Increase Bidil  to 1 tab tid.   - She needs to avoid  pregnancy given teratogenic potentional of cardiac meds. She is not using contraception. She understands she need to contact HF clinic if she is gets pregnant so we can adjust HF medications. She would be high risk with pregnancy due to chronically low EF.  - LAD territory is unlikely to be viable based on MRI, so would not pursue CTO procedure or CABG.  2. H/o LV thrombus: Previously on warfarin, now switched to Eliquis . No thrombus seen on most recent echo. 3. 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 No chest pain.  - On Eliquis , so no ASA.  - Goal LDL < 70, continue Crestor  40 mg daily.  - LAD territory is unlikely to be viable based on MRI, so would not pursue CTO procedure or CABG. 4. Obesity: Body mass index is 41.64 kg/m.  Working on weight loss.  - Refer to pharmacy clinic to see if she can get semaglutide or tirzepatide.   Follow up in 3 months with APP.   I spent 32 minutes reviewing records, interviewing/examining patient, directing echo, and managing orders.    Signed, Ezra Shuck, MD  05/02/2024  [facility] [place] [address] [facility] [place] [address] [phone] (office) [phone] (fax)

## 2024-05-09 ENCOUNTER — Ambulatory Visit: Attending: Cardiology | Admitting: Pharmacist Clinician (PhC)/ Clinical Pharmacy Specialist

## 2024-05-09 VITALS — Ht 61.0 in | Wt 224.0 lb

## 2024-05-09 DIAGNOSIS — I5022 Chronic systolic (congestive) heart failure: Secondary | ICD-10-CM | POA: Insufficient documentation

## 2024-05-09 DIAGNOSIS — Z79899 Other long term (current) drug therapy: Secondary | ICD-10-CM | POA: Insufficient documentation

## 2024-05-09 DIAGNOSIS — Z6841 Body Mass Index (BMI) 40.0 and over, adult: Secondary | ICD-10-CM | POA: Insufficient documentation

## 2024-05-09 DIAGNOSIS — Z713 Dietary counseling and surveillance: Secondary | ICD-10-CM | POA: Insufficient documentation

## 2024-05-09 NOTE — Progress Notes (Unsigned)
 Office Visit    Patient Name: Sheila Estes Date of Encounter: 05/11/2024  Primary Care Provider:  Celestia Rosaline SQUIBB, NP Primary Cardiologist:  None  Chief Complaint    Weight management  Significant Past Medical History   MI 2020 at age 33 - occurred prior to hospitalization  CHF Dx 2020 EF 25-30% w/ peri-apical akinesis   LV thrombus Found at time of MI/CHF hospitalization; now on Eliquis   SCAD 2020       Allergies  Allergen Reactions   Gluten Meal Shortness Of Breath and Other (See Comments)    Wheezing    Other Shortness Of Breath    Environmental allergies    History of Present Illness    Sheila Estes is a 33 y.o. female patient of Dr Rolan, in the office today to discuss options for weight management.    Current weight management medications: none  Previously tried meds: none  Current meds that may affect weight: none  Baseline weight/BMI: 101.6 kg // 42.35  Insurance payor: Medicaid  Diet: eats lunch, but tends to graze most of the rest of the day (has fluid overload easily) variety of protein daily; sausage burrito and fries ; likes sweets, cookies, cheescake, ice cream; no vegetables  Exercise: lives on 3rd floor  Confirmed patient not pregnant and no personal or family history of medullary thyroid carcinoma (MTC) or Multiple Endocrine Neoplasia syndrome type 2 (MEN 2).   Accessory Clinical Findings    Lab Results  Component Value Date   CREATININE 0.67 05/02/2024   BUN 13 05/02/2024   NA 137 05/02/2024   K 3.6 05/02/2024   CL 106 05/02/2024   CO2 22 05/02/2024   Lab Results  Component Value Date   ALT 16 08/24/2023   AST 17 08/24/2023   ALKPHOS 31 (L) 08/24/2023   BILITOT 0.6 08/24/2023   Lab Results  Component Value Date   HGBA1C 5.1 01/11/2024      Home Medications/Allergies    Current Outpatient Medications  Medication Sig Dispense Refill   albuterol  (VENTOLIN  HFA) 108 (90 Base) MCG/ACT inhaler Inhale 2 puffs  into the lungs every 6 (six) hours as needed for wheezing or shortness of breath. 1 each 2   apixaban  (ELIQUIS ) 5 MG TABS tablet TAKE 1 TABLET(5 MG) BY MOUTH TWICE DAILY 180 tablet 3   BIDIL  20-37.5 MG tablet Take 1 tablet by mouth 3 (three) times daily. 90 tablet 5   carvedilol  (COREG ) 25 MG tablet Take 1 tablet (25 mg total) by mouth 2 (two) times daily with a meal. 180 tablet 3   dapagliflozin  propanediol (FARXIGA ) 10 MG TABS tablet TAKE 1 TABLET(10 MG) BY MOUTH DAILY BEFORE BREAKFAST 90 tablet 3   furosemide  (LASIX ) 20 MG tablet Take 2 tablets (40 mg total) by mouth daily. 60 tablet 3   potassium chloride  SA (KLOR-CON  M) 20 MEQ tablet Take 1 tablet (20 mEq total) by mouth daily. With Lasix  90 tablet 3   rosuvastatin  (CRESTOR ) 40 MG tablet Take 1 tablet (40 mg total) by mouth daily. 90 tablet 3   sacubitril -valsartan  (ENTRESTO ) 97-103 MG TAKE 1 TABLET BY MOUTH TWICE DAILY 60 tablet 10   spironolactone  (ALDACTONE ) 25 MG tablet Take 1 tablet (25 mg total) by mouth daily. 90 tablet 3   No current facility-administered medications for this visit.     Allergies  Allergen Reactions   Gluten Meal Shortness Of Breath and Other (See Comments)    Wheezing    Other  Shortness Of Breath    Environmental allergies    Assessment & Plan    BMI 40.0-44.9, adult [facility])  Patient has not met goal of at least 5% of body weight loss with comprehensive lifestyle modifications alone in the past 3-6 months. Pharmacotherapy is appropriate to pursue as augmentation. Will start Wegovy.   Confirmed patient not pregnant and no personal or family history of medullary thyroid carcinoma (MTC) or Multiple Endocrine Neoplasia syndrome type 2 (MEN 2).   Advised patient on common side effects including nausea, diarrhea, dyspepsia, decreased appetite, and fatigue. Counseled patient on reducing meal size and how to titrate medication to minimize side effects. Patient aware to call if intolerable side effects or if  experiencing dehydration, abdominal pain, or dizziness. Patient will adhere to dietary modifications and will target at least 150 minutes of moderate intensity exercise weekly, plus resistance training twice a week (as recommended by the American Heart Association). This resistance training--such as weightlifting, bodyweight exercises, or using resistance bands, adapted to the patient's ability--will help prevent muscle loss.  Injection technique reviewed at today's visit.    Follow up in 1-2 days regarding coverage of Wegovy . If therapy is initiated, phone or MyChart follow-ups will be conducted every 4 weeks for dose titration until the patient reaches the effective therapeutic dose and target weight.   Lyle Leisner PharmD CPP [facility]  [address] [place], [place] [address] [phone]

## 2024-05-09 NOTE — Patient Instructions (Signed)
 We will start the prior authorization process to get Wegovy covered by your insurance.   TIPS FOR SUCCESS Write down the reasons why you want to lose weight and post it in a place where you'll see it often. Start small and work your way up. Keep in mind that it takes time to achieve goals, and small steps add up. Any additional movements help to burn calories. Taking the stairs rather than the elevator and parking at the far end of your parking lot are easy ways to start. Brisk walking for at least 30 minutes 4 or more days of the week is an excellent goal to work toward  [employer] WHAT IT MEANS TO FEEL FULL Did you know that it can take 15 minutes or more for your brain to receive the message that you've eaten? That means that, if you eat less food, but consume it slower, you may still feel satisfied. Eating a lot of fruits and vegetables can also help you feel fuller. Eat off of smaller plates so that moderate portions don't seem too small  TITRATION PLAN Will plan to follow the titration plan as below, pending patient is tolerating each dose before increasing to the next. Can slow titration if needed for tolerability.    We will try to get Wegovy approved by the insurance company.  If so, you will start with 0.25 mg once weekly, injected into the abdomen  If you have any questions or concerns, please reach out to us  via MyChart  THANK YOU FOR CHOOSING [facility]

## 2024-05-11 ENCOUNTER — Other Ambulatory Visit (HOSPITAL_COMMUNITY): Payer: Self-pay

## 2024-05-11 ENCOUNTER — Telehealth: Payer: Self-pay | Admitting: Pharmacist Clinician (PhC)/ Clinical Pharmacy Specialist

## 2024-05-11 ENCOUNTER — Telehealth: Payer: Self-pay | Admitting: Pharmacy Technician

## 2024-05-11 ENCOUNTER — Encounter: Payer: Self-pay | Admitting: Pharmacist Clinician (PhC)/ Clinical Pharmacy Specialist

## 2024-05-11 NOTE — Telephone Encounter (Signed)
       Sent message to provider she has to be 35 yrs old or older to qualify

## 2024-05-11 NOTE — Assessment & Plan Note (Signed)
  Patient has not met goal of at least 5% of body weight loss with comprehensive lifestyle modifications alone in the past 3-6 months. Pharmacotherapy is appropriate to pursue as augmentation. Will start Wegovy.   Confirmed patient not pregnant and no personal or family history of medullary thyroid carcinoma (MTC) or Multiple Endocrine Neoplasia syndrome type 2 (MEN 2).   Advised patient on common side effects including nausea, diarrhea, dyspepsia, decreased appetite, and fatigue. Counseled patient on reducing meal size and how to titrate medication to minimize side effects. Patient aware to call if intolerable side effects or if experiencing dehydration, abdominal pain, or dizziness. Patient will adhere to dietary modifications and will target at least 150 minutes of moderate intensity exercise weekly, plus resistance training twice a week (as recommended by the American Heart Association). This resistance training--such as weightlifting, bodyweight exercises, or using resistance bands, adapted to the patient's ability--will help prevent muscle loss.  Injection technique reviewed at today's visit.    Follow up in 1-2 days regarding coverage of Wegovy . If therapy is initiated, phone or MyChart follow-ups will be conducted every 4 weeks for dose titration until the patient reaches the effective therapeutic dose and target weight.

## 2024-05-11 NOTE — Telephone Encounter (Signed)
 Please do PA for [facility].  History of MI

## 2024-05-16 DIAGNOSIS — F411 Generalized anxiety disorder: Secondary | ICD-10-CM | POA: Diagnosis not present

## 2024-05-21 ENCOUNTER — Telehealth (INDEPENDENT_AMBULATORY_CARE_PROVIDER_SITE_OTHER): Payer: Self-pay | Admitting: Primary Care

## 2024-05-21 NOTE — Telephone Encounter (Signed)
 Called pt to confirm appt. Pt will be present.

## 2024-05-22 ENCOUNTER — Other Ambulatory Visit (HOSPITAL_COMMUNITY)
Admission: RE | Admit: 2024-05-22 | Discharge: 2024-05-22 | Disposition: A | Discharge status: Home / Self Care | Source: Ambulatory Visit | Attending: Primary Care | Admitting: Primary Care

## 2024-05-22 ENCOUNTER — Ambulatory Visit (INDEPENDENT_AMBULATORY_CARE_PROVIDER_SITE_OTHER): Admitting: Primary Care

## 2024-05-22 VITALS — BP 112/77 | HR 70 | Resp 16 | Ht 61.0 in | Wt 216.8 lb

## 2024-05-22 DIAGNOSIS — B9689 Other specified bacterial agents as the cause of diseases classified elsewhere: Secondary | ICD-10-CM | POA: Diagnosis not present

## 2024-05-22 DIAGNOSIS — N760 Acute vaginitis: Secondary | ICD-10-CM

## 2024-05-22 DIAGNOSIS — Z23 Encounter for immunization: Secondary | ICD-10-CM

## 2024-05-22 DIAGNOSIS — Z113 Encounter for screening for infections with a predominantly sexual mode of transmission: Secondary | ICD-10-CM | POA: Diagnosis present

## 2024-05-22 DIAGNOSIS — Z124 Encounter for screening for malignant neoplasm of cervix: Secondary | ICD-10-CM | POA: Diagnosis present

## 2024-05-22 NOTE — Patient Instructions (Addendum)
 Pneumococcal Conjugate Vaccine: What You Need to Know Many vaccine information statements are available in Spanish and other languages. See PromoAge.com.br. 1. Why get vaccinated? Pneumococcal conjugate vaccine can prevent pneumococcal disease. Pneumococcal disease refers to any illness caused by pneumococcal bacteria. These bacteria can cause many types of illnesses, including pneumonia, which is an infection of the lungs. Pneumococcal bacteria are one of the most common causes of pneumonia. Besides pneumonia, pneumococcal bacteria can also cause: Ear infections Sinus infections Meningitis (infection of the tissue covering the brain and spinal cord) Bacteremia (infection of the blood) Anyone can get pneumococcal disease, but children under 10 years old, people with certain medical conditions or other risk factors, and adults 65 years or older are at the highest risk. Most pneumococcal infections are mild. However, some can result in long-term problems, such as brain damage or hearing loss. Meningitis, bacteremia, and pneumonia caused by pneumococcal disease can be fatal. 2. Pneumococcal conjugate vaccine Pneumococcal conjugate vaccine helps protect against bacteria that cause pneumococcal disease. There are three pneumococcal conjugate vaccines (PCV13, PCV15, and PCV20). The different vaccines are recommended for different people based on age and medical status. Your health care provider can help you determine which type of pneumococcal conjugate vaccine, and how many doses, you should receive. Infants and young children usually need 4 doses of pneumococcal conjugate vaccine. These doses are recommended at 2, 4, 6, and 27-54 months of age. Older children and adolescents might need pneumococcal conjugate vaccine depending on their age and medical conditions or other risk factors if they did not receive the recommended doses as infants or young children. Adults 19 through 27 years old with certain  medical conditions or other risk factors who have not already received pneumococcal conjugate vaccine should receive pneumococcal conjugate vaccine. Adults 65 years or older who have not previously received pneumococcal conjugate vaccine should receive pneumococcal conjugate vaccine. Some people with certain medical conditions are also recommended to receive pneumococcal polysaccharide vaccine (a different type of pneumococcal vaccine known as PPSV23). Some adults who have previously received a pneumococcal conjugate vaccine may be recommended to receive another pneumococcal conjugate vaccine. 3. Talk with your health care provider Tell your vaccination provider if the person getting the vaccine: Has had an allergic reaction after a previous dose of any type of pneumococcal conjugate vaccine (PCV13, PCV15, PCV20, or an earlier pneumococcal conjugate vaccine known as PCV7), or to any vaccine containing diphtheria toxoid (for example, DTaP), or has any severe, life-threatening allergies In some cases, your health care provider may decide to postpone pneumococcal conjugate vaccination until a future visit. People with minor illnesses, such as a cold, may be vaccinated. People who are moderately or severely ill should usually wait until they recover. Your health care provider can give you more information. 4. Risks of a vaccine reaction Redness, swelling, pain, or tenderness where the shot is given, and fever, loss of appetite, fussiness (irritability), feeling tired, headache, muscle aches, joint pain, and chills can happen after pneumococcal conjugate vaccination. Young children may be at increased risk for seizures caused by fever after a pneumococcal conjugate vaccine if it is administered at the same time as inactivated influenza vaccine. Ask your health care provider for more information. People sometimes faint after medical procedures, including vaccination. Tell your provider if you feel dizzy or  have vision changes or ringing in the ears. As with any medicine, there is a very remote chance of a vaccine causing a severe allergic reaction, other serious injury, or death. 5.  What if there is a serious problem? An allergic reaction could occur after the vaccinated person leaves the clinic. If you see signs of a severe allergic reaction (hives, swelling of the face and throat, difficulty breathing, a fast heartbeat, dizziness, or weakness), call 9-1-1 and get the person to the nearest hospital. For other signs that concern you, call your health care provider. Adverse reactions should be reported to the Vaccine Adverse Event Reporting System (VAERS). Your health care provider will usually file this report, or you can do it yourself. Visit the VAERS website at www.vaers.LAgents.no or call (936)386-8569. VAERS is only for reporting reactions, and VAERS staff members do not give medical advice. 6. The National Vaccine Injury Compensation Program The [employer] Vaccine Injury Compensation Program (VICP) is a federal program that was created to compensate people who may have been injured by certain vaccines. Claims regarding alleged injury or death due to vaccination have a time limit for filing, which may be as short as two years. Visit the VICP website at SpiritualWord.at or call 985-842-2365 to learn about the program and about filing a claim. 7. How can I learn more? Ask your health care provider. Call your local or state health department. Visit the website of the Food and Drug Administration (FDA) for vaccine package inserts and additional information at FinderList.no. Contact the Centers for Disease Control and Prevention (CDC): Call 803-607-0692 (1-800-CDC-INFO) or Visit CDC's website at PicCapture.uy. Source: CDC Vaccine Information Statement (Interim) Pneumococcal Conjugate Vaccine (11/13/2021) This same material is available at  FootballExhibition.com.br for no charge. This information is not intended to replace advice given to you by your health care provider. Make sure you discuss any questions you have with your health care provider. Document Revised: 10/06/2022 Document Reviewed: 07/12/2022 Elsevier Patient Education  2024 Elsevier Inc.  Influenza Vaccine Injection What is this medication? INFLUENZA VACCINE (in floo EN zuh vak SEEN) reduces the risk of the influenza (flu). It does not treat influenza. It is still possible to get influenza after receiving this vaccine, but the symptoms may be less severe or not last as long. It works by helping your immune system learn how to fight off a future infection. This medicine may be used for other purposes; ask your health care provider or pharmacist if you have questions. COMMON BRAND NAME(S): Afluria Trivalent, FLUAD Trivalent, Fluarix Trivalent, Flublok Trivalent, FLUCELVAX Trivalent, Flulaval Trivalent, Fluzone Trivalent What should I tell my care team before I take this medication? They need to know if you have any of these conditions: Bleeding disorder like hemophilia Fever or infection Guillain-Barre syndrome or other neurological problems Immune system problems Infection with the human immunodeficiency virus (HIV) or AIDS Low blood platelet counts Multiple sclerosis An unusual or allergic reaction to influenza virus vaccine, latex, other medications, foods, dyes, or preservatives. Different brands of vaccines contain different allergens. Some may contain latex or eggs. Talk to your care team about your allergies to make sure that you get the right vaccine. Pregnant or trying to get pregnant Breastfeeding How should I use this medication? This vaccine is injected into a muscle or under the skin. It is given by your care team. A copy of Vaccine Information Statements will be given before each vaccination. Be sure to read this sheet carefully each time. This sheet may change  often. Talk to your care team to see which vaccines are right for you. Some vaccines should not be used in all age groups. Overdosage: If you think you have taken too much  of this medicine contact a poison control center or emergency room at once. NOTE: This medicine is only for you. Do not share this medicine with others. What if I miss a dose? This does not apply. What may interact with this medication? Certain medications that lower your immune system, such as etanercept, anakinra, infliximab, adalimumab Certain medications that prevent or treat blood clots, such as warfarin Chemotherapy or radiation therapy Phenytoin Steroid medications, such as prednisone or cortisone Theophylline Vaccines This list may not describe all possible interactions. Give your health care provider a list of all the medicines, herbs, non-prescription drugs, or dietary supplements you use. Also tell them if you smoke, drink alcohol, or use illegal drugs. Some items may interact with your medicine. What should I watch for while using this medication? Report any side effects that do not go away with your care team. Call your care team if any unusual symptoms occur within 6 weeks of receiving this vaccine. You may still catch the flu, but the illness is not usually as bad. You cannot get the flu from the vaccine. The vaccine will not protect against colds or other illnesses that may cause fever. The vaccine is needed every year. What side effects may I notice from receiving this medication? Side effects that you should report to your care team as soon as possible: Allergic reactions--skin rash, itching, hives, swelling of the face, lips, tongue, or throat Side effects that usually do not require medical attention (report these to your care team if they continue or are bothersome): Chills Fatigue Headache Joint pain Loss of appetite Muscle pain Nausea Pain, redness, or irritation at injection site This list may  not describe all possible side effects. Call your doctor for medical advice about side effects. You may report side effects to FDA at 1-800-FDA-1088. Where should I keep my medication? The vaccine is only given by your care team. It will not be stored at home. NOTE: This sheet is a summary. It may not cover all possible information. If you have questions about this medicine, talk to your doctor, pharmacist, or health care provider.  2024 Elsevier/Gold Standard (2021-12-01 00:00:00)

## 2024-05-22 NOTE — Progress Notes (Signed)
 [facility]  WELL-WOMAN PHYSICAL & PAP Patient name: Sheila Estes MRN [id number]  Date of birth: 11-23-1990 Chief Complaint:   New Patient (Initial Visit) (Re-establish//Left/right big toe is numb(informed cardio they wanted her to address with pcp)//Pt is requesting nebulizer and inhaler refills )  History of Present Illness:   Sheila Estes is a 33 y.o. G65P1001 female being seen today for a routine well-woman exam.   CC:  reestablish care and pap   The current method of family planning is none.  Patient's last menstrual period was 04/24/2024. Last pap 11/21.  Last mammogram: 2015. Results were:  Family h/o breast cancer: No Last colonoscopy: n/a.. Family h/o colorectal cancer: No  Health Maintenance  Topic Date Due   Hepatitis C Screening  Never done   Hepatitis B Vaccine (1 of 3 - 19+ 3-dose series) Never done   HPV Vaccine (1 - 3-dose SCDM series) Never done   COVID-19 Vaccine (3 - Pfizer risk series) 11/20/2019   Pap with HPV screening  05/21/2023   DTaP/Tdap/Td vaccine (2 - Td or Tdap) 05/20/2030   Pneumococcal Vaccine  Completed   Flu Shot  Completed   HIV Screening  Completed   Meningitis B Vaccine  Aged Out   Review of Systems:    Denies any headaches, blurred vision, fatigue, shortness of breath, chest pain, abdominal pain, abnormal vaginal discharge/itching/odor/irritation, problems with periods, bowel movements, urination, or intercourse unless otherwise stated above.  Pertinent History Reviewed:   Reviewed past medical,surgical, social and family history.  Reviewed problem list, medications and allergies.  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   She has a past surgical history that includes ARTHROSCOPTIC SHOULDER (Left, 2007); Cervical conization w/bx (N/A, 12/14/2016); RIGHT/LEFT HEART CATH AND CORONARY ANGIOGRAPHY (N/A, 08/30/2018); and ICD IMPLANT (N/A, 08/26/2021).   Her family history includes Diabetes in her maternal grandmother; Hypertension in her father, maternal grandfather, and mother.She reports that she has never smoked. She has never used smokeless tobacco. She reports current alcohol use. She reports that she does not use drugs.  Current Outpatient Medications on File Prior to Visit  Medication Sig Dispense Refill   albuterol  (VENTOLIN  HFA) 108 (90 Base) MCG/ACT inhaler Inhale 2 puffs into the lungs every 6 (six) hours as needed for wheezing or shortness of breath. 1 each 2   apixaban  (ELIQUIS ) 5 MG TABS tablet TAKE 1 TABLET(5 MG) BY MOUTH TWICE DAILY 180 tablet 3   BIDIL  20-37.5 MG tablet Take 1 tablet by mouth 3 (three) times daily. 90 tablet 5   carvedilol  (COREG ) 25 MG tablet Take 1 tablet (25 mg total) by mouth 2 (two) times daily with a meal. 180 tablet 3   dapagliflozin  propanediol (FARXIGA ) 10 MG TABS tablet TAKE 1 TABLET(10 MG) BY MOUTH DAILY BEFORE BREAKFAST 90 tablet 3   furosemide  (LASIX ) 20 MG tablet Take 2 tablets (40 mg total) by mouth daily. 60 tablet 3   potassium chloride  SA (KLOR-CON  M) 20 MEQ tablet Take 1 tablet (20 mEq total) by mouth daily. With Lasix  90 tablet 3   rosuvastatin  (CRESTOR ) 40 MG tablet Take 1 tablet (40 mg total) by mouth daily. 90 tablet  3   sacubitril -valsartan  (ENTRESTO ) 97-103 MG TAKE 1 TABLET BY MOUTH TWICE DAILY 60 tablet 10   spironolactone  (ALDACTONE ) 25 MG tablet Take 1 tablet (25 mg total) by mouth daily. 90 tablet 3   No current facility-administered medications on file prior to visit.    Physical Assessment:   Vitals:   05/22/24 0906  BP:  112/77  Pulse: 70  Resp: 16  SpO2: 98%  Weight: 216 lb 12.8 oz (98.3 kg)  Height: 5' 1 (1.549 m)  Body mass index is 40.96 kg/m.        Physical Examination:  General appearance - well appearing, and in no distress Mental status - alert, oriented to person, place, and time Psych:  She has a normal mood and affect Skin - warm and dry, normal color, no suspicious lesions noted Chest - effort normal, all lung fields clear to auscultation bilaterally Heart - normal rate and regular rhythm Neck:  midline trachea, no thyromegaly or nodules Breasts - breasts appear normal, no suspicious masses, no skin or nipple changes or axillary nodes Educated patient on proper self breast examination and had patient to demonstrate SBE. Abdomen - soft, nontender, nondistended, no masses or organomegaly Pelvic-VULVA: normal appearing vulva with no masses, tenderness or lesions   VAGINA: normal appearing vagina with normal color and discharge, no lesions   CERVIX: normal appearing cervix without discharge or lesions, no CMT UTERUS: uterus is felt to be normal size, shape, consistency and nontender  ADNEXA: No adnexal masses or tenderness noted. Extremities:  No swelling or varicosities noted  No results found for this or any previous visit (from the past 24 hours).   Assessment & Plan:   Sheila Estes was seen today for new patient (initial visit).  Diagnoses and all orders for this visit:  Encounter for immunization -     Pneumococcal conjugate vaccine 20-valent -     Flu vaccine trivalent PF, 6mos and older(Flulaval,Afluria,Fluarix,Fluzone)  Cervical cancer screening -     Cytology - PAP  Screening for STD (sexually transmitted disease) -     Cervicovaginal ancillary only    Orders Placed This Encounter  Procedures   Pneumococcal conjugate vaccine 20-valent   Flu vaccine trivalent PF, 6mos and older(Flulaval,Afluria,Fluarix,Fluzone)    Meds: No orders of the defined types were placed in  this encounter.   Follow-up: annually  This note has been created with Education officer, environmental. Any transcriptional errors are unintentional.   Rosaline SHAUNNA Bohr, NP 05/29/2024, 9:16 AM

## 2024-05-24 ENCOUNTER — Encounter: Payer: Self-pay | Admitting: Cardiology

## 2024-05-25 ENCOUNTER — Telehealth (INDEPENDENT_AMBULATORY_CARE_PROVIDER_SITE_OTHER): Payer: Self-pay

## 2024-05-25 LAB — CERVICOVAGINAL ANCILLARY ONLY
Bacterial Vaginitis (gardnerella): POSITIVE — AB
Candida Glabrata: NEGATIVE
Candida Vaginitis: NEGATIVE
Chlamydia: NEGATIVE
Comment: NEGATIVE
Comment: NEGATIVE
Comment: NEGATIVE
Comment: NEGATIVE
Comment: NEGATIVE
Comment: NORMAL
Neisseria Gonorrhea: NEGATIVE
Trichomonas: NEGATIVE

## 2024-05-25 NOTE — Telephone Encounter (Signed)
 Copied from CRM [id number]. Topic: Clinical - Prescription Issue >> May 25, 2024  2:10 PM Pinkey ORN wrote: Reason for CRM: Prescriptions Not Sent >> May 25, 2024  2:11 PM Pinkey ORN wrote: Patient states she was seen in office on Nov 18th and was told that her inhaler and nebulizer solution would be refilled, but it hasn't. Please follow up with patient.

## 2024-05-29 ENCOUNTER — Other Ambulatory Visit (INDEPENDENT_AMBULATORY_CARE_PROVIDER_SITE_OTHER): Payer: Self-pay | Admitting: Primary Care

## 2024-05-29 DIAGNOSIS — J4540 Moderate persistent asthma, uncomplicated: Secondary | ICD-10-CM

## 2024-05-29 LAB — CYTOLOGY - PAP
Comment: NEGATIVE
Comment: NEGATIVE
Comment: NEGATIVE
Diagnosis: UNDETERMINED — AB
HPV 16: NEGATIVE
HPV 18 / 45: NEGATIVE
High risk HPV: POSITIVE — AB

## 2024-05-29 MED ORDER — ALBUTEROL SULFATE HFA 108 (90 BASE) MCG/ACT IN AERS: 2.0000 | INHALATION_SPRAY | Freq: Four times a day (QID) | RESPIRATORY_TRACT | 2 refills | Status: AC | PRN

## 2024-05-29 MED ORDER — METRONIDAZOLE 500 MG PO TABS: 500.0000 mg | ORAL_TABLET | Freq: Two times a day (BID) | ORAL | 0 refills | Status: DC

## 2024-05-29 NOTE — Telephone Encounter (Signed)
 Will forward to provider

## 2024-06-03 ENCOUNTER — Ambulatory Visit (INDEPENDENT_AMBULATORY_CARE_PROVIDER_SITE_OTHER): Payer: Self-pay | Admitting: Primary Care

## 2024-06-20 ENCOUNTER — Ambulatory Visit: Payer: Self-pay

## 2024-06-20 DIAGNOSIS — I255 Ischemic cardiomyopathy: Secondary | ICD-10-CM

## 2024-06-21 LAB — CUP PACEART REMOTE DEVICE CHECK
Battery Remaining Longevity: 144 mo
Battery Remaining Percentage: 100 %
Brady Statistic RV Percent Paced: 0 %
Date Time Interrogation Session: 20251217042900
HighPow Impedance: 67 Ohm
Implantable Lead Connection Status: 753985
Implantable Lead Implant Date: 20230222
Implantable Lead Location: 753860
Implantable Lead Model: 137
Implantable Lead Serial Number: 301096
Implantable Pulse Generator Implant Date: 20230222
Lead Channel Setting Pacing Amplitude: 2.5 V
Lead Channel Setting Pacing Pulse Width: 0.4 ms
Lead Channel Setting Sensing Sensitivity: 0.5 mV
Pulse Gen Serial Number: 215988

## 2024-06-21 NOTE — Progress Notes (Signed)
 Remote ICD Transmission

## 2024-06-22 ENCOUNTER — Ambulatory Visit: Payer: Self-pay | Admitting: Cardiology

## 2024-07-09 ENCOUNTER — Other Ambulatory Visit: Payer: Self-pay

## 2024-07-09 ENCOUNTER — Inpatient Hospital Stay (HOSPITAL_BASED_OUTPATIENT_CLINIC_OR_DEPARTMENT_OTHER)
Admission: EM | Admit: 2024-07-09 | Discharge: 2024-07-12 | DRG: 871 | Disposition: A | Discharge status: Home / Self Care | Attending: Family Medicine | Admitting: Family Medicine

## 2024-07-09 ENCOUNTER — Ambulatory Visit (HOSPITAL_COMMUNITY)
Admission: RE | Admit: 2024-07-09 | Discharge: 2024-07-09 | Disposition: A | Discharge status: Home / Self Care | Source: Ambulatory Visit | Attending: Cardiology | Admitting: Cardiology

## 2024-07-09 ENCOUNTER — Emergency Department (HOSPITAL_BASED_OUTPATIENT_CLINIC_OR_DEPARTMENT_OTHER)

## 2024-07-09 ENCOUNTER — Encounter (HOSPITAL_BASED_OUTPATIENT_CLINIC_OR_DEPARTMENT_OTHER): Payer: Self-pay | Admitting: *Deleted

## 2024-07-09 DIAGNOSIS — J121 Respiratory syncytial virus pneumonia: Principal | ICD-10-CM | POA: Diagnosis present

## 2024-07-09 DIAGNOSIS — A4189 Other specified sepsis: Principal | ICD-10-CM | POA: Diagnosis present

## 2024-07-09 DIAGNOSIS — J45909 Unspecified asthma, uncomplicated: Secondary | ICD-10-CM | POA: Diagnosis present

## 2024-07-09 DIAGNOSIS — F319 Bipolar disorder, unspecified: Secondary | ICD-10-CM | POA: Diagnosis present

## 2024-07-09 DIAGNOSIS — J9601 Acute respiratory failure with hypoxia: Secondary | ICD-10-CM | POA: Diagnosis present

## 2024-07-09 DIAGNOSIS — Z1152 Encounter for screening for COVID-19: Secondary | ICD-10-CM

## 2024-07-09 DIAGNOSIS — I428 Other cardiomyopathies: Secondary | ICD-10-CM | POA: Diagnosis not present

## 2024-07-09 DIAGNOSIS — Z8249 Family history of ischemic heart disease and other diseases of the circulatory system: Secondary | ICD-10-CM

## 2024-07-09 DIAGNOSIS — J205 Acute bronchitis due to respiratory syncytial virus: Secondary | ICD-10-CM | POA: Diagnosis present

## 2024-07-09 DIAGNOSIS — Z8679 Personal history of other diseases of the circulatory system: Secondary | ICD-10-CM

## 2024-07-09 DIAGNOSIS — I255 Ischemic cardiomyopathy: Secondary | ICD-10-CM | POA: Diagnosis present

## 2024-07-09 DIAGNOSIS — I371 Nonrheumatic pulmonary valve insufficiency: Secondary | ICD-10-CM | POA: Insufficient documentation

## 2024-07-09 DIAGNOSIS — I5022 Chronic systolic (congestive) heart failure: Secondary | ICD-10-CM | POA: Diagnosis not present

## 2024-07-09 DIAGNOSIS — E876 Hypokalemia: Secondary | ICD-10-CM | POA: Diagnosis present

## 2024-07-09 DIAGNOSIS — Z95810 Presence of automatic (implantable) cardiac defibrillator: Secondary | ICD-10-CM | POA: Insufficient documentation

## 2024-07-09 DIAGNOSIS — I272 Pulmonary hypertension, unspecified: Secondary | ICD-10-CM | POA: Insufficient documentation

## 2024-07-09 DIAGNOSIS — Z9109 Other allergy status, other than to drugs and biological substances: Secondary | ICD-10-CM

## 2024-07-09 DIAGNOSIS — Z91199 Patient's noncompliance with other medical treatment and regimen due to unspecified reason: Secondary | ICD-10-CM

## 2024-07-09 DIAGNOSIS — T45516A Underdosing of anticoagulants, initial encounter: Secondary | ICD-10-CM | POA: Diagnosis present

## 2024-07-09 DIAGNOSIS — R6520 Severe sepsis without septic shock: Secondary | ICD-10-CM | POA: Diagnosis present

## 2024-07-09 DIAGNOSIS — Z79899 Other long term (current) drug therapy: Secondary | ICD-10-CM

## 2024-07-09 DIAGNOSIS — J188 Other pneumonia, unspecified organism: Secondary | ICD-10-CM | POA: Diagnosis present

## 2024-07-09 DIAGNOSIS — I110 Hypertensive heart disease with heart failure: Secondary | ICD-10-CM | POA: Diagnosis present

## 2024-07-09 DIAGNOSIS — Z91018 Allergy to other foods: Secondary | ICD-10-CM

## 2024-07-09 DIAGNOSIS — E785 Hyperlipidemia, unspecified: Secondary | ICD-10-CM | POA: Diagnosis present

## 2024-07-09 DIAGNOSIS — I420 Dilated cardiomyopathy: Secondary | ICD-10-CM | POA: Diagnosis present

## 2024-07-09 DIAGNOSIS — I959 Hypotension, unspecified: Secondary | ICD-10-CM | POA: Diagnosis present

## 2024-07-09 DIAGNOSIS — Z7901 Long term (current) use of anticoagulants: Secondary | ICD-10-CM

## 2024-07-09 DIAGNOSIS — Z833 Family history of diabetes mellitus: Secondary | ICD-10-CM

## 2024-07-09 LAB — ECHOCARDIOGRAM COMPLETE
Area-P 1/2: 6.54 cm2
Calc EF: 27.8 %
S' Lateral: 4.7 cm
Single Plane A2C EF: 17.6 %
Single Plane A4C EF: 38.9 %

## 2024-07-09 LAB — CBC WITH DIFFERENTIAL/PLATELET
Abs Immature Granulocytes: 0.03 K/uL (ref 0.00–0.07)
Basophils Absolute: 0 K/uL (ref 0.0–0.1)
Basophils Relative: 1 %
Eosinophils Absolute: 0 K/uL (ref 0.0–0.5)
Eosinophils Relative: 1 %
HCT: 42.7 % (ref 36.0–46.0)
Hemoglobin: 14.9 g/dL (ref 12.0–15.0)
Immature Granulocytes: 0 %
Lymphocytes Relative: 11 %
Lymphs Abs: 0.9 K/uL (ref 0.7–4.0)
MCH: 31.8 pg (ref 26.0–34.0)
MCHC: 34.9 g/dL (ref 30.0–36.0)
MCV: 91.2 fL (ref 80.0–100.0)
Monocytes Absolute: 0.6 K/uL (ref 0.1–1.0)
Monocytes Relative: 7 %
Neutro Abs: 6.5 K/uL (ref 1.7–7.7)
Neutrophils Relative %: 80 %
Platelets: 206 K/uL (ref 150–400)
RBC: 4.68 MIL/uL (ref 3.87–5.11)
RDW: 13.2 % (ref 11.5–15.5)
WBC: 8.1 K/uL (ref 4.0–10.5)
nRBC: 0 % (ref 0.0–0.2)

## 2024-07-09 LAB — TROPONIN T, HIGH SENSITIVITY: Troponin T High Sensitivity: <15 ng/L (ref 0–19)

## 2024-07-09 LAB — HCG, SERUM, QUALITATIVE: Preg, Serum: NEGATIVE

## 2024-07-09 LAB — RESP PANEL BY RT-PCR (RSV, FLU A&B, COVID)  RVPGX2
Influenza A by PCR: NEGATIVE
Influenza B by PCR: NEGATIVE
Resp Syncytial Virus by PCR: POSITIVE — AB
SARS Coronavirus 2 by RT PCR: NEGATIVE

## 2024-07-09 LAB — BASIC METABOLIC PANEL WITH GFR
Anion gap: 16 — ABNORMAL HIGH (ref 5–15)
BUN: 11 mg/dL (ref 6–20)
CO2: 22 mmol/L (ref 22–32)
Calcium: 9.5 mg/dL (ref 8.9–10.3)
Chloride: 99 mmol/L (ref 98–111)
Creatinine, Ser: 0.71 mg/dL (ref 0.44–1.00)
GFR, Estimated: >60 mL/min
Glucose, Bld: 102 mg/dL — ABNORMAL HIGH (ref 70–99)
Potassium: 3.4 mmol/L — ABNORMAL LOW (ref 3.5–5.1)
Sodium: 136 mmol/L (ref 135–145)

## 2024-07-09 LAB — LACTIC ACID, PLASMA: Lactic Acid, Venous: 0.9 mmol/L (ref 0.5–1.9)

## 2024-07-09 LAB — PRO BRAIN NATRIURETIC PEPTIDE: Pro Brain Natriuretic Peptide: 451 pg/mL — ABNORMAL HIGH

## 2024-07-09 MED ORDER — ALBUTEROL SULFATE (2.5 MG/3ML) 0.083% IN NEBU
INHALATION_SOLUTION | RESPIRATORY_TRACT | Status: AC
  Filled 2024-07-09: qty 6

## 2024-07-09 MED ORDER — PERFLUTREN LIPID MICROSPHERE
1.0000 mL | INTRAVENOUS | Status: AC | PRN
  Administered 2024-07-09: 3 mL via INTRAVENOUS

## 2024-07-09 MED ORDER — IPRATROPIUM BROMIDE 0.02 % IN SOLN
0.5000 mg | Freq: Once | RESPIRATORY_TRACT | Status: AC
  Administered 2024-07-09: 0.5 mg via RESPIRATORY_TRACT

## 2024-07-09 MED ORDER — IOHEXOL 350 MG/ML SOLN
75.0000 mL | Freq: Once | INTRAVENOUS | Status: AC | PRN
  Administered 2024-07-10: 75 mL via INTRAVENOUS

## 2024-07-09 MED ORDER — ALBUTEROL SULFATE (2.5 MG/3ML) 0.083% IN NEBU
5.0000 mg | INHALATION_SOLUTION | Freq: Once | RESPIRATORY_TRACT | Status: AC
  Administered 2024-07-09: 5 mg via RESPIRATORY_TRACT

## 2024-07-09 NOTE — Progress Notes (Signed)
" °  2D Echocardiogram has been performed.  Cassanda Walmer 07/09/2024, 10:16 AM "

## 2024-07-09 NOTE — ED Notes (Signed)
 Pt just drove back from [place] , hx of blood clots and is supposed to be on Eliquis  but has missed a weeks worth of this med.

## 2024-07-09 NOTE — ED Notes (Signed)
 PA in room for MSE

## 2024-07-09 NOTE — ED Triage Notes (Signed)
 Pt with hx of CHF and has a ICD is here due to URI with cough and sob.  Pt states that she is unable to lay down.  Temp 100.24F in triage.  Spo2, spo2 92% on RA

## 2024-07-10 ENCOUNTER — Encounter (HOSPITAL_BASED_OUTPATIENT_CLINIC_OR_DEPARTMENT_OTHER): Payer: Self-pay | Admitting: Internal Medicine

## 2024-07-10 DIAGNOSIS — Z79899 Other long term (current) drug therapy: Secondary | ICD-10-CM | POA: Diagnosis not present

## 2024-07-10 DIAGNOSIS — Z91018 Allergy to other foods: Secondary | ICD-10-CM | POA: Diagnosis not present

## 2024-07-10 DIAGNOSIS — J205 Acute bronchitis due to respiratory syncytial virus: Secondary | ICD-10-CM | POA: Diagnosis present

## 2024-07-10 DIAGNOSIS — F319 Bipolar disorder, unspecified: Secondary | ICD-10-CM | POA: Diagnosis present

## 2024-07-10 DIAGNOSIS — I959 Hypotension, unspecified: Secondary | ICD-10-CM | POA: Diagnosis present

## 2024-07-10 DIAGNOSIS — Z1152 Encounter for screening for COVID-19: Secondary | ICD-10-CM | POA: Diagnosis not present

## 2024-07-10 DIAGNOSIS — I420 Dilated cardiomyopathy: Secondary | ICD-10-CM | POA: Diagnosis present

## 2024-07-10 DIAGNOSIS — Z833 Family history of diabetes mellitus: Secondary | ICD-10-CM | POA: Diagnosis not present

## 2024-07-10 DIAGNOSIS — Z9109 Other allergy status, other than to drugs and biological substances: Secondary | ICD-10-CM | POA: Diagnosis not present

## 2024-07-10 DIAGNOSIS — J121 Respiratory syncytial virus pneumonia: Secondary | ICD-10-CM | POA: Diagnosis present

## 2024-07-10 DIAGNOSIS — I255 Ischemic cardiomyopathy: Secondary | ICD-10-CM | POA: Diagnosis present

## 2024-07-10 DIAGNOSIS — J188 Other pneumonia, unspecified organism: Secondary | ICD-10-CM | POA: Diagnosis not present

## 2024-07-10 DIAGNOSIS — Z8679 Personal history of other diseases of the circulatory system: Secondary | ICD-10-CM | POA: Diagnosis not present

## 2024-07-10 DIAGNOSIS — J45909 Unspecified asthma, uncomplicated: Secondary | ICD-10-CM | POA: Diagnosis present

## 2024-07-10 DIAGNOSIS — I5022 Chronic systolic (congestive) heart failure: Secondary | ICD-10-CM | POA: Diagnosis present

## 2024-07-10 DIAGNOSIS — Z91199 Patient's noncompliance with other medical treatment and regimen due to unspecified reason: Secondary | ICD-10-CM | POA: Diagnosis not present

## 2024-07-10 DIAGNOSIS — T45516A Underdosing of anticoagulants, initial encounter: Secondary | ICD-10-CM | POA: Diagnosis present

## 2024-07-10 DIAGNOSIS — I110 Hypertensive heart disease with heart failure: Secondary | ICD-10-CM | POA: Diagnosis present

## 2024-07-10 DIAGNOSIS — R6520 Severe sepsis without septic shock: Secondary | ICD-10-CM | POA: Diagnosis present

## 2024-07-10 DIAGNOSIS — Z8249 Family history of ischemic heart disease and other diseases of the circulatory system: Secondary | ICD-10-CM | POA: Diagnosis not present

## 2024-07-10 DIAGNOSIS — E876 Hypokalemia: Secondary | ICD-10-CM | POA: Diagnosis present

## 2024-07-10 DIAGNOSIS — E785 Hyperlipidemia, unspecified: Secondary | ICD-10-CM | POA: Diagnosis present

## 2024-07-10 DIAGNOSIS — A4189 Other specified sepsis: Secondary | ICD-10-CM | POA: Diagnosis present

## 2024-07-10 DIAGNOSIS — Z7901 Long term (current) use of anticoagulants: Secondary | ICD-10-CM | POA: Diagnosis not present

## 2024-07-10 DIAGNOSIS — J9601 Acute respiratory failure with hypoxia: Secondary | ICD-10-CM | POA: Diagnosis present

## 2024-07-10 LAB — CBC
HCT: 41.3 % (ref 36.0–46.0)
Hemoglobin: 14.7 g/dL (ref 12.0–15.0)
MCH: 32.9 pg (ref 26.0–34.0)
MCHC: 35.6 g/dL (ref 30.0–36.0)
MCV: 92.4 fL (ref 80.0–100.0)
Platelets: 201 K/uL (ref 150–400)
RBC: 4.47 MIL/uL (ref 3.87–5.11)
RDW: 13.3 % (ref 11.5–15.5)
WBC: 9.1 K/uL (ref 4.0–10.5)
nRBC: 0 % (ref 0.0–0.2)

## 2024-07-10 LAB — COMPREHENSIVE METABOLIC PANEL WITH GFR
ALT: 8 U/L (ref 0–44)
AST: 18 U/L (ref 15–41)
Albumin: 4.5 g/dL (ref 3.5–5.0)
Alkaline Phosphatase: 42 U/L (ref 38–126)
Anion gap: 15 (ref 5–15)
BUN: 11 mg/dL (ref 6–20)
CO2: 22 mmol/L (ref 22–32)
Calcium: 9.3 mg/dL (ref 8.9–10.3)
Chloride: 100 mmol/L (ref 98–111)
Creatinine, Ser: 0.72 mg/dL (ref 0.44–1.00)
GFR, Estimated: >60 mL/min
Glucose, Bld: 107 mg/dL — ABNORMAL HIGH (ref 70–99)
Potassium: 3.5 mmol/L (ref 3.5–5.1)
Sodium: 136 mmol/L (ref 135–145)
Total Bilirubin: 2 mg/dL — ABNORMAL HIGH (ref 0.0–1.2)
Total Protein: 8.1 g/dL (ref 6.5–8.1)

## 2024-07-10 LAB — TROPONIN T, HIGH SENSITIVITY: Troponin T High Sensitivity: <15 ng/L (ref 0–19)

## 2024-07-10 MED ORDER — SACUBITRIL-VALSARTAN 97-103 MG PO TABS
1.0000 | ORAL_TABLET | Freq: Two times a day (BID) | ORAL | Status: DC
  Filled 2024-07-10: qty 1

## 2024-07-10 MED ORDER — SODIUM CHLORIDE 0.9 % IV SOLN
1.0000 g | Freq: Once | INTRAVENOUS | Status: AC
  Administered 2024-07-10: 1 g via INTRAVENOUS
  Filled 2024-07-10: qty 10

## 2024-07-10 MED ORDER — DAPAGLIFLOZIN PROPANEDIOL 10 MG PO TABS
10.0000 mg | ORAL_TABLET | Freq: Every day | ORAL | Status: DC
  Filled 2024-07-10: qty 1

## 2024-07-10 MED ORDER — AZITHROMYCIN 250 MG PO TABS
500.0000 mg | ORAL_TABLET | Freq: Every day | ORAL | Status: DC
  Administered 2024-07-11: 500 mg via ORAL
  Filled 2024-07-10: qty 2

## 2024-07-10 MED ORDER — IPRATROPIUM-ALBUTEROL 0.5-2.5 (3) MG/3ML IN SOLN
3.0000 mL | Freq: Four times a day (QID) | RESPIRATORY_TRACT | Status: DC | PRN
  Administered 2024-07-10 (×2): 3 mL via RESPIRATORY_TRACT
  Filled 2024-07-10: qty 3

## 2024-07-10 MED ORDER — ACETAMINOPHEN 325 MG PO TABS
650.0000 mg | ORAL_TABLET | Freq: Four times a day (QID) | ORAL | Status: DC | PRN
  Administered 2024-07-10 (×2): 650 mg via ORAL
  Filled 2024-07-10 (×2): qty 2

## 2024-07-10 MED ORDER — CARVEDILOL 25 MG PO TABS
25.0000 mg | ORAL_TABLET | Freq: Two times a day (BID) | ORAL | Status: DC
  Administered 2024-07-10 – 2024-07-12 (×4): 25 mg via ORAL
  Filled 2024-07-10: qty 1
  Filled 2024-07-10: qty 2
  Filled 2024-07-10 (×3): qty 1

## 2024-07-10 MED ORDER — ONDANSETRON HCL 4 MG PO TABS: 4.0000 mg | ORAL_TABLET | Freq: Four times a day (QID) | ORAL | Status: DC | PRN

## 2024-07-10 MED ORDER — APIXABAN 5 MG PO TABS
5.0000 mg | ORAL_TABLET | Freq: Two times a day (BID) | ORAL | Status: DC
  Administered 2024-07-10 – 2024-07-12 (×6): 5 mg via ORAL
  Filled 2024-07-10 (×6): qty 1

## 2024-07-10 MED ORDER — IPRATROPIUM-ALBUTEROL 0.5-2.5 (3) MG/3ML IN SOLN
3.0000 mL | Freq: Three times a day (TID) | RESPIRATORY_TRACT | Status: DC
  Administered 2024-07-11 – 2024-07-12 (×3): 3 mL via RESPIRATORY_TRACT
  Filled 2024-07-10 (×4): qty 3

## 2024-07-10 MED ORDER — ONDANSETRON HCL 4 MG/2ML IJ SOLN
4.0000 mg | Freq: Once | INTRAMUSCULAR | Status: AC
  Administered 2024-07-10: 4 mg via INTRAVENOUS

## 2024-07-10 MED ORDER — SPIRONOLACTONE 25 MG PO TABS
25.0000 mg | ORAL_TABLET | Freq: Every day | ORAL | Status: DC
  Administered 2024-07-10: 25 mg via ORAL
  Filled 2024-07-10: qty 2

## 2024-07-10 MED ORDER — TRAZODONE HCL 50 MG PO TABS: 25.0000 mg | ORAL_TABLET | Freq: Every evening | ORAL | Status: DC | PRN

## 2024-07-10 MED ORDER — IPRATROPIUM-ALBUTEROL 0.5-2.5 (3) MG/3ML IN SOLN
RESPIRATORY_TRACT | Status: AC
  Administered 2024-07-10: 3 mL via RESPIRATORY_TRACT
  Filled 2024-07-10: qty 3

## 2024-07-10 MED ORDER — FUROSEMIDE 40 MG PO TABS
40.0000 mg | ORAL_TABLET | Freq: Every day | ORAL | Status: DC
  Administered 2024-07-10: 40 mg via ORAL
  Filled 2024-07-10: qty 1

## 2024-07-10 MED ORDER — SODIUM CHLORIDE 0.9 % IV BOLUS
500.0000 mL | Freq: Once | INTRAVENOUS | Status: AC
  Administered 2024-07-10: 500 mL via INTRAVENOUS

## 2024-07-10 MED ORDER — GUAIFENESIN-DM 100-10 MG/5ML PO SYRP
5.0000 mL | ORAL_SOLUTION | ORAL | Status: DC | PRN
  Administered 2024-07-10 – 2024-07-12 (×4): 5 mL via ORAL
  Filled 2024-07-10 (×4): qty 5

## 2024-07-10 MED ORDER — POTASSIUM CHLORIDE CRYS ER 20 MEQ PO TBCR
20.0000 meq | EXTENDED_RELEASE_TABLET | Freq: Every day | ORAL | Status: DC
  Administered 2024-07-10: 20 meq via ORAL
  Filled 2024-07-10 (×3): qty 1

## 2024-07-10 MED ORDER — SODIUM CHLORIDE 0.9 % IV SOLN
2.0000 g | INTRAVENOUS | Status: DC
  Administered 2024-07-10 – 2024-07-12 (×3): 2 g via INTRAVENOUS
  Filled 2024-07-10 (×3): qty 20

## 2024-07-10 MED ORDER — ONDANSETRON HCL 4 MG/2ML IJ SOLN: 4.0000 mg | Freq: Four times a day (QID) | INTRAMUSCULAR | Status: DC | PRN

## 2024-07-10 MED ORDER — SODIUM CHLORIDE 0.9 % IV SOLN
500.0000 mg | Freq: Once | INTRAVENOUS | Status: AC
  Administered 2024-07-10: 500 mg via INTRAVENOUS
  Filled 2024-07-10: qty 5

## 2024-07-10 NOTE — Plan of Care (Addendum)
 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   Patient took trip to [place]  over the weekend returned yesterday and being noncompliant with Eliquis .  Patient reported her daughter is also sick.  Presentation to ED patient has low-grade fever, tachycardic, and tachypnea.  In the ED O2 sat dropped to low 80s at room air currently 93 to 94% on 2 L oxygen .  Respiratory panel positive with RSV. Troponin x 2 within normal range.  Blood cultures are in process.  Elevated proBNP 451.  BMP showed low potassium 3.4  and elevated anion gap 16.  EKG showed normal sinus rhythm heart rate 98 and right ventricular hypertrophy pattern.  Chest x-ray showing: Cardiomegaly/pneumonia/atelectasis/pulmonary vascular congestion. IMPRESSION: 1. Hazy opacity at the right lung base, which may represent atelectasis, infection, or asymmetric edema. 2. Cardiomegaly with prominent central pulmonary vessels and left AICD in place.  CTA chest ruled out PE.: IMPRESSION: 1. No evidence of pulmonary embolism. 2. Patchy central airspace opacities in the lungs, less prominent than on the prior study, likely representing multifocal pneumonia. This could represent recurrent acute multifocal pneumonia or a recurrent chronic inflammatory process. 3. Cardiomegaly with minimal pericardial effusion.    In the ED patient received albuterol , azithromycin , ceftriaxone , DuoNeb and Zofran .   Hospitalist consulted for further emergent management of multifocal pneumonia and RSV infection and acute hypoxic respiratory failure in setting of pneumonia.  [facility] will assume care on arrival to accepting facility. Until arrival, care as  per EDP. However, [facility] available 24/7 for questions and assistance. Check [email or web address] for on-call coverage. Nursing staff, please call [facility] Admits & Consults System-Wide number under Amion on patient's arrival so appropriate admitting provider can evaluate the pt.   Author: Bryonna Sundby, MD  [facility]

## 2024-07-10 NOTE — ED Notes (Signed)
 Feeling better after breathing treatment. CT called to attempt scan. Made aware of RSV status.

## 2024-07-10 NOTE — ED Notes (Signed)
 Pt keeps taking nasal cannula off - advised pt that her oxygen  drops to 85 during coughing fits

## 2024-07-10 NOTE — ED Provider Notes (Signed)
 "  [facility]  Provider Note  CSN: [id number] Arrival date & time: 07/09/24 2240  History Chief Complaint  Patient presents with   URI    Sheila Estes is a 34 y.o. 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 She took a trip to [place]  over the weekend, returned yesterday and began having cough, congestion, wheezing and SOB. She has not had any leg swelling. She reports her daughter has been sick as well. She reports compliance with her other medications.    Home Medications Prior to Admission medications  Medication Sig Start Date End Date Taking? Authorizing Provider  albuterol  (VENTOLIN  HFA) 108 (90 Base) MCG/ACT inhaler Inhale 2 puffs into the lungs every 6 (six) hours as needed for wheezing or shortness of breath. 05/29/24   Celestia Rosaline SQUIBB, NP  apixaban  (ELIQUIS ) 5 MG TABS tablet TAKE 1 TABLET(5 MG) BY MOUTH TWICE DAILY 03/06/24   McLean, Dalton S, MD  BIDIL  20-37.5 MG tablet Take 1 tablet by mouth 3 (three) times daily. 05/02/24   Rolan Ezra RAMAN, MD  carvedilol  (COREG ) 25 MG tablet Take 1 tablet (25 mg total) by mouth 2 (two) times daily with a meal. 03/27/24   Rolan Ezra RAMAN, MD  dapagliflozin  propanediol (FARXIGA ) 10 MG TABS tablet TAKE 1 TABLET(10 MG) BY MOUTH DAILY BEFORE BREAKFAST 03/28/24   [place]ford, Harlene HERO, FNP  furosemide  (LASIX ) 20 MG tablet Take 2 tablets (40 mg total) by mouth daily. 09/21/23   [place], Harlene HERO, FNP  metroNIDAZOLE  (FLAGYL ) 500 MG tablet Take 1 tablet (500 mg total) by mouth 2 (two) times daily. 05/29/24   Celestia Rosaline SQUIBB, NP  potassium chloride  SA (KLOR-CON  M) 20 MEQ tablet Take 1 tablet (20 mEq total) by mouth daily. With Lasix  09/21/23   Glena Harlene HERO, FNP  rosuvastatin  (CRESTOR ) 40 MG tablet Take 1 tablet (40 mg total) by mouth daily. 06/23/23 01/10/25  Rolan Ezra RAMAN, MD   sacubitril -valsartan  (ENTRESTO ) 97-103 MG TAKE 1 TABLET BY MOUTH TWICE DAILY 06/28/23   Rolan Ezra RAMAN, MD  spironolactone  (ALDACTONE ) 25 MG tablet Take 1 tablet (25 mg total) by mouth daily. 06/06/23   Rolan Ezra RAMAN, MD     Allergies    Gluten meal and Other   Review of Systems   Review of Systems Please see HPI for pertinent positives and negatives  Physical Exam BP 126/72   Pulse 100   Temp (!) 100.5 F (38.1 C) (Oral)   Resp 14   SpO2 93%   Physical Exam Vitals and nursing note reviewed.  Constitutional:      Appearance: Normal appearance.  HENT:     Head: Normocephalic and atraumatic.     Nose: Nose normal.     Mouth/Throat:     Mouth: Mucous membranes are moist.  Eyes:     Extraocular Movements: Extraocular movements intact.     Conjunctiva/sclera: Conjunctivae normal.  Cardiovascular:     Rate and Rhythm: Tachycardia present.  Pulmonary:     Effort: Pulmonary effort is normal.     Breath sounds: Wheezing and rales present.  Abdominal:     General: Abdomen is flat.     Palpations: Abdomen is soft.     Tenderness: There is no abdominal tenderness.  Musculoskeletal:        General: No swelling. Normal range of motion.  Cervical back: Neck supple.     Right lower leg: No edema.     Left lower leg: No edema.  Skin:    General: Skin is warm and dry.  Neurological:     General: No focal deficit present.     Mental Status: She is alert.  Psychiatric:        Mood and Affect: Mood normal.     ED Results / Procedures / Treatments   EKG None  Procedures .Critical Care  Performed by: Roselyn Carlin NOVAK, MD Authorized by: Roselyn Carlin NOVAK, MD   Critical care provider statement:    Critical care time (minutes):  40   Critical care time was exclusive of:  Separately billable procedures and treating other patients   Critical care was necessary to treat or prevent imminent or life-threatening deterioration of the following conditions:  Respiratory  failure and sepsis   Critical care was time spent personally by me on the following activities:  Development of treatment plan with patient or surrogate, discussions with consultants, evaluation of patient's response to treatment, examination of patient, ordering and review of laboratory studies, ordering and review of radiographic studies, ordering and performing treatments and interventions, pulse oximetry, re-evaluation of patient's condition and review of old charts   Care discussed with: admitting provider     Medications Ordered in the ED Medications  albuterol  (PROVENTIL ) (2.5 MG/3ML) 0.083% nebulizer solution (  Not Given 07/09/24 2351)  apixaban  (ELIQUIS ) tablet 5 mg (has no administration in time range)  carvedilol  (COREG ) tablet 25 mg (has no administration in time range)  dapagliflozin  propanediol (FARXIGA ) tablet 10 mg (has no administration in time range)  furosemide  (LASIX ) tablet 40 mg (has no administration in time range)  potassium chloride  SA (KLOR-CON  M) CR tablet 20 mEq (has no administration in time range)  sacubitril -valsartan  (ENTRESTO ) 97-103 mg per tablet (has no administration in time range)  spironolactone  (ALDACTONE ) tablet 25 mg (has no administration in time range)  albuterol  (PROVENTIL ) (2.5 MG/3ML) 0.083% nebulizer solution 5 mg (5 mg Nebulization Given 07/09/24 2351)  ipratropium (ATROVENT ) nebulizer solution 0.5 mg (0.5 mg Nebulization Given 07/09/24 2350)  iohexol  (OMNIPAQUE ) 350 MG/ML injection 75 mL (75 mLs Intravenous Contrast Given 07/10/24 0018)  cefTRIAXone  (ROCEPHIN ) 1 g in sodium chloride  0.9 % 100 mL IVPB (1 g Intravenous New Bag/Given 07/10/24 0114)  azithromycin  (ZITHROMAX ) 500 mg in sodium chloride  0.9 % 250 mL IVPB (500 mg Intravenous New Bag/Given 07/10/24 0103)  ondansetron  (ZOFRAN ) injection 4 mg (4 mg Intravenous Given 07/10/24 0133)    Initial Impression and Plan  Patient with complex cardiac history as above, here with fever, tachycardia, low SpO2 and  cough. She was given neb with some improvement prior to my evaluation. She had labs showing normal CBC, BMP is unremarkable. Trop is neg. Lactic acid is normal. HGC is neg. BNP only mildly elevated. Covid/Flu/RSV swab is positive for RSV. She had an outpatient echo earlier in the day with reduced EF similar to previous, no mention of persistent thrombus. Will check CTA for PE given travel and clot history, although not specifically VTE.  ED Course   Clinical Course as of 07/10/24 0253  Tue Jul 10, 2024  0049 I personally viewed the images from radiology studies and agree with radiologist interpretation: CTA neg for PE, does show signs of multifocal pneumonia. Initial presentation and labs more consistent with viral etiology and so Abx not given at that time. Will add cultures and begin Abx now.  [CS]  0200 Patient remains on oxygen , discussed admission and she is amenable. Will consult hospitalist.  [CS]  [phone] Spoke with Dr. Sundil, hospitalist, who will accept for admission.  [CS]    Clinical Course User Index [CS] Roselyn Carlin NOVAK, MD     MDM Rules/Calculators/A&P Medical Decision Making Problems Addressed: Acute respiratory failure with hypoxia [facility]): acute illness or injury Multifocal pneumonia: acute illness or injury Pneumonia due to respiratory syncytial virus (RSV): acute illness or injury  Amount and/or Complexity of Data Reviewed Labs: ordered. Decision-making details documented in ED Course. Radiology: ordered and independent interpretation performed. Decision-making details documented in ED Course. ECG/medicine tests: ordered and independent interpretation performed. Decision-making details documented in ED Course.  Risk Prescription drug management. Decision regarding hospitalization.     Final Clinical Impression(s) / ED Diagnoses Final diagnoses:  Pneumonia due to respiratory syncytial virus (RSV)  Multifocal pneumonia  Acute respiratory failure with hypoxia  [facility]    Rx / DC Orders ED Discharge Orders     None        Roselyn Carlin NOVAK, MD 07/10/24 [phone]  "

## 2024-07-10 NOTE — Plan of Care (Signed)

## 2024-07-10 NOTE — H&P (Signed)
 " History and Physical  Sheila Estes FMW:[id number] DOB: 01/07/1991 DOA: 07/09/2024  PCP: Celestia Rosaline SQUIBB, NP   Chief Complaint: Cough, shortness of breath  HPI: Sheila Estes is a 34 y.o. 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  Patient states that she has been having cough and shortness of breath for the last 4 days or so, she has also been having some fevers which started today in the emergency department.  Cough is productive of yellow sputum.  She denies any chest pain, she does have some abdominal fullness which is usually a sign of her heart failure, although it is not severe at all.  She is also having a little bit of orthopnea the last couple of days which is not normal for her.  Review of Systems: Please see HPI for pertinent positives and negatives. A complete 10 system review of systems are otherwise negative.  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   Past Surgical History:  Procedure Laterality Date   ARTHROSCOPTIC SHOULDER Left 2007   CERVICAL CONIZATION W/BX N/A 12/14/2016   Procedure: COLD KNIFE CONIZATION, REMOVAL OF BODY JEWELRY;  Surgeon: Izell Harari, MD;  Location: [facility];  Service: Gynecology;  Laterality: N/A;   ICD IMPLANT N/A  08/26/2021   Procedure: ICD IMPLANT;  Surgeon: Fernande Elspeth BROCKS, MD;  Location: [facility] INVASIVE CV LAB;  Service: Cardiovascular;  Laterality: N/A;   RIGHT/LEFT HEART CATH AND CORONARY ANGIOGRAPHY N/A 08/30/2018   Procedure: RIGHT/LEFT HEART CATH AND CORONARY ANGIOGRAPHY;  Surgeon: Rolan Ezra RAMAN, MD;  Location: [facility] INVASIVE CV LAB;  Service: Cardiovascular;  Laterality: N/A;   Social History:  reports that she has never smoked. She has never been exposed to tobacco smoke. She has never used smokeless tobacco. She reports current alcohol use. She reports that she does not use drugs.  Allergies[1]  Family History  Problem Relation Age of Onset   Hypertension Mother    Hypertension Father    Diabetes Maternal Grandmother    Hypertension Maternal Grandfather      Prior to Admission medications  Medication Sig Start Date End Date Taking? Authorizing Provider  albuterol  (VENTOLIN  HFA) 108 (90 Base) MCG/ACT inhaler Inhale 2 puffs into the lungs every 6 (six) hours as needed for wheezing or shortness of breath. 05/29/24   Celestia Rosaline SQUIBB, NP  apixaban  (ELIQUIS ) 5 MG TABS tablet TAKE 1 TABLET(5 MG) BY MOUTH TWICE DAILY 03/06/24   McLean, Dalton S, MD  BIDIL  20-37.5 MG tablet Take 1 tablet by mouth 3 (three) times daily. 05/02/24   Rolan Ezra RAMAN, MD  carvedilol  (COREG ) 25 MG tablet Take 1 tablet (25 mg total) by mouth 2 (two) times daily with  a meal. 03/27/24   Rolan Ezra RAMAN, MD  dapagliflozin  propanediol (FARXIGA ) 10 MG TABS tablet TAKE 1 TABLET(10 MG) BY MOUTH DAILY BEFORE BREAKFAST 03/28/24   Milford, Harlene HERO, FNP  furosemide  (LASIX ) 20 MG tablet Take 2 tablets (40 mg total) by mouth daily. 09/21/23   Milford, Harlene HERO, FNP  metroNIDAZOLE  (FLAGYL ) 500 MG tablet Take 1 tablet (500 mg total) by mouth 2 (two) times daily. 05/29/24   Celestia Rosaline SQUIBB, NP  potassium chloride  SA (KLOR-CON  M) 20 MEQ tablet Take 1 tablet (20 mEq total) by mouth daily. With Lasix  09/21/23   Glena Harlene HERO, FNP   rosuvastatin  (CRESTOR ) 40 MG tablet Take 1 tablet (40 mg total) by mouth daily. 06/23/23 01/10/25  Rolan Ezra RAMAN, MD  sacubitril -valsartan  (ENTRESTO ) 97-103 MG TAKE 1 TABLET BY MOUTH TWICE DAILY 06/28/23   Rolan Ezra RAMAN, MD  spironolactone  (ALDACTONE ) 25 MG tablet Take 1 tablet (25 mg total) by mouth daily. 06/06/23   Rolan Ezra RAMAN, MD    Physical Exam: BP (!) 95/57 (BP Location: Right Arm)   Pulse 79   Temp 98.5 F (36.9 C) (Oral)   Resp 19   Ht 5' 1 (1.549 m)   Wt 98.3 kg   SpO2 100%   BMI 40.95 kg/m  General:  Alert, oriented, calm, in no acute distress, resting comfortably on 2 L nasal cannula oxygen .  Looks nontoxic, speaking full sentences, no cough. Cardiovascular: RRR, no murmurs or rubs, no peripheral edema  Respiratory: She has good equal bilateral air entry, with some diffuse rhonchi, minimal end expiratory wheezing. Abdomen: soft, nontender, nondistended, normal bowel tones heard  Skin: dry, no rashes  Musculoskeletal: no joint effusions, normal range of motion  Psychiatric: appropriate affect, normal speech  Neurologic: extraocular muscles intact, clear speech, moving all extremities with intact sensorium         Labs on Admission:  Basic Metabolic Panel: Recent Labs  Lab 07/09/24 2302 07/10/24 0309  NA 136 136  K 3.4* 3.5  CL 99 100  CO2 22 22  GLUCOSE 102* 107*  BUN 11 11  CREATININE 0.71 0.72  CALCIUM  9.5 9.3   Liver Function Tests: Recent Labs  Lab 07/10/24 0309  AST 18  ALT 8  ALKPHOS 42  BILITOT 2.0*  PROT 8.1  ALBUMIN 4.5   No results for input(s): LIPASE, AMYLASE in the last 168 hours. No results for input(s): AMMONIA in the last 168 hours. CBC: Recent Labs  Lab 07/09/24 2302 07/10/24 0309  WBC 8.1 9.1  NEUTROABS 6.5  --   HGB 14.9 14.7  HCT 42.7 41.3  MCV 91.2 92.4  PLT 206 201   Cardiac Enzymes: No results for input(s): CKTOTAL, CKMB, CKMBINDEX, TROPONINI in the last 168 hours. BNP (last 3  results) Recent Labs    09/21/23 1152 01/11/24 1129 05/02/24 1609  BNP 256.8* 195.9* 274.0*    ProBNP (last 3 results) Recent Labs    07/09/24 2302  PROBNP 451.0*    CBG: No results for input(s): GLUCAP in the last 168 hours.  Radiological Exams on Admission: CT Angio Chest PE W and/or Wo Contrast Result Date: 07/10/2024 EXAM: CTA CHEST 07/10/2024 12:27:47 AM TECHNIQUE: CTA of the chest was performed without and with the administration of 75 mL of intravenous contrast (iohexol  (OMNIPAQUE ) 350 MG/ML injection 75 mL IOHEXOL  350 MG/ML SOLN). Multiplanar reformatted images are provided for review. MIP images are provided for review. Automated exposure control, iterative reconstruction, and/or weight based adjustment of the mA/kV  was utilized to reduce the radiation dose to as low as reasonably achievable. COMPARISON: Chest radiograph 07/09/2024 and CT chest 08/28/2018. CLINICAL HISTORY: Pulmonary embolism (PE) suspected, high prob; cp. FINDINGS: PULMONARY ARTERIES: There is good opacification of the central and segmental pulmonary arteries. No focal filling defects. No evidence of significant pulmonary embolus. Main pulmonary artery is normal in caliber. MEDIASTINUM: Cardiac pacemaker. Cardiac enlargement. Minimal pericardial effusion. Normal caliber thoracic aorta. The esophagus is decompressed. Thyroid gland is unremarkable. LYMPH NODES: No mediastinal, hilar or axillary lymphadenopathy. LUNGS AND PLEURA: Patchy central airspace opacities in the lungs likely representing multifocal pneumonia. Infiltrates are less prominent than on the prior study. This could represent recurrent acute multifocal pneumonia a recurrent chronic inflammatory process. No evidence of pleural effusion or pneumothorax. UPPER ABDOMEN: Limited images of the upper abdomen are unremarkable. SOFT TISSUES AND BONES: No acute bone or soft tissue abnormality. IMPRESSION: 1. No evidence of pulmonary embolism. 2. Patchy central  airspace opacities in the lungs, less prominent than on the prior study, likely representing multifocal pneumonia. This could represent recurrent acute multifocal pneumonia or a recurrent chronic inflammatory process. 3. Cardiomegaly with minimal pericardial effusion. Electronically signed by: Elsie Gravely MD 07/10/2024 12:33 AM EST RP Workstation: [id number]   DG Chest Portable 1 View Result Date: 07/09/2024 EXAM: 1 VIEW(S) XRAY OF THE CHEST 07/09/2024 11:36:00 PM COMPARISON: 08/27/2021 CLINICAL HISTORY: sob FINDINGS: LUNGS AND PLEURA: Prominent central pulmonary vessels. Hazy opacity at right lung base, possibly representing atelectasis, infection, or asymmetric edema. No pleural effusion. No pneumothorax. HEART AND MEDIASTINUM: Left AICD in place. Cardiomegaly. BONES AND SOFT TISSUES: No acute osseous abnormality. IMPRESSION: 1. Hazy opacity at the right lung base, which may represent atelectasis, infection, or asymmetric edema. 2. Cardiomegaly with prominent central pulmonary vessels and left AICD in place. Electronically signed by: Elsie Gravely MD 07/09/2024 11:42 PM EST RP Workstation: [id number]   ECHOCARDIOGRAM COMPLETE Result Date: 07/09/2024    ECHOCARDIOGRAM REPORT   Patient Name:   Sheila Estes Date of Exam: 07/09/2024 Medical Rec #:  [id number]        Height:       61.0 in Accession #:    [id number]       Weight:       216.8 lb Date of Birth:  15-Feb-1991        BSA:          1.955 m Patient Age:    33 years         BP:           128/82 mmHg Patient Gender: F                HR:           81 bpm. Exam Location:  Outpatient Procedure: 2D Echo (Both Spectral and Color Flow Doppler were utilized during            procedure). Indications:    congestive heart failure  History:        Patient has prior history of Echocardiogram examinations, most                 recent 06/23/2023. Cardiomyopathy; Defibrillator.  Sonographer:    Tinnie Barefoot RDCS Referring Phys: [phone] EZRA RAMAN [facility] [facility]  Sonographer  Comments: Image acquisition challenging due to patient body habitus. IMPRESSIONS  1. Left ventricular ejection fraction, by estimation, is 25 to 30%. The left ventricle has severely decreased function. The left ventricle demonstrates regional wall motion abnormalities (see scoring diagram/findings for  description). The left ventricular internal cavity size was mildly to moderately dilated. Left ventricular diastolic parameters are consistent with Grade III diastolic dysfunction (restrictive).  2. Right ventricular systolic function is mildly reduced. The right ventricular size is mildly enlarged. There is mildly elevated pulmonary artery systolic pressure.  3. Left atrial size was mild to moderately dilated.  4. The mitral valve is normal in structure. Trivial mitral valve regurgitation. No evidence of mitral stenosis.  5. The aortic valve is normal in structure. Aortic valve regurgitation is not visualized. No aortic stenosis is present.  6. Pulmonic valve regurgitation is moderate to severe.  7. The inferior vena cava is normal in size with greater than 50% respiratory variability, suggesting right atrial pressure of 3 mmHg. FINDINGS  Left Ventricle: Left ventricular ejection fraction, by estimation, is 25 to 30%. The left ventricle has severely decreased function. The left ventricle demonstrates regional wall motion abnormalities. Definity  contrast agent was given IV to delineate the left ventricular endocardial borders. The left ventricular internal cavity size was mildly to moderately dilated. There is no left ventricular hypertrophy. Left ventricular diastolic parameters are consistent with Grade III diastolic dysfunction (restrictive).  LV Wall Scoring: The mid and distal anterior wall, mid and distal lateral wall, entire anterior septum, entire apex, and mid and distal inferior wall are akinetic. Right Ventricle: The right ventricular size is mildly enlarged. No increase in right ventricular wall thickness.  Right ventricular systolic function is mildly reduced. There is mildly elevated pulmonary artery systolic pressure. The tricuspid regurgitant  velocity is 2.91 m/s, and with an assumed right atrial pressure of 3 mmHg, the estimated right ventricular systolic pressure is 36.9 mmHg. Left Atrium: Left atrial size was mild to moderately dilated. Right Atrium: Right atrial size was normal in size. Pericardium: There is no evidence of pericardial effusion. Mitral Valve: The mitral valve is normal in structure. Trivial mitral valve regurgitation. No evidence of mitral valve stenosis. Tricuspid Valve: The tricuspid valve is normal in structure. Tricuspid valve regurgitation is mild . No evidence of tricuspid stenosis. Aortic Valve: The aortic valve is normal in structure. Aortic valve regurgitation is not visualized. No aortic stenosis is present. Pulmonic Valve: The pulmonic valve was normal in structure. Pulmonic valve regurgitation is moderate to severe. No evidence of pulmonic stenosis. Aorta: The aortic root is normal in size and structure. Venous: The inferior vena cava is normal in size with greater than 50% respiratory variability, suggesting right atrial pressure of 3 mmHg. IAS/Shunts: No atrial level shunt detected by color flow Doppler. Additional Comments: A device lead is visualized.  LEFT VENTRICLE PLAX 2D LVIDd:         6.10 cm      Diastology LVIDs:         4.70 cm      LV e' medial:    4.24 cm/s LV PW:         1.10 cm      LV E/e' medial:  27.6 LV IVS:        0.90 cm      LV e' lateral:   6.53 cm/s LVOT diam:     1.60 cm      LV E/e' lateral: 17.9 LV SV:         41 LV SV Index:   21 LVOT Area:     2.01 cm LV IVRT:       81 msec  LV Volumes (MOD) LV vol d, MOD A2C: 204.0 ml LV vol d, MOD  A4C: 193.0 ml LV vol s, MOD A2C: 168.0 ml LV vol s, MOD A4C: 118.0 ml LV SV MOD A2C:     36.0 ml LV SV MOD A4C:     193.0 ml LV SV MOD BP:      57.8 ml RIGHT VENTRICLE            IVC RV Basal diam:  2.70 cm    IVC diam:  1.50 cm RV S prime:     8.59 cm/s TAPSE (M-mode): 1.5 cm     PULMONARY VEINS                            Diastolic Velocity: 59.40 cm/s                            S/D Velocity:       0.50                            Systolic Velocity:  27.70 cm/s LEFT ATRIUM             Index        RIGHT ATRIUM           Index LA diam:        4.10 cm 2.10 cm/m   RA Area:     11.90 cm LA Vol (A2C):   65.4 ml 33.46 ml/m  RA Volume:   25.80 ml  13.20 ml/m LA Vol (A4C):   58.6 ml 29.98 ml/m LA Biplane Vol: 62.8 ml 32.13 ml/m  AORTIC VALVE LVOT Vmax:   115.00 cm/s LVOT Vmean:  75.500 cm/s LVOT VTI:    0.204 m  AORTA Ao Root diam: 2.00 cm Ao Asc diam:  2.70 cm MITRAL VALVE                TRICUSPID VALVE MV Area (PHT): 6.54 cm     TR Peak grad:   33.9 mmHg MV Decel Time: 116 msec     TR Vmax:        291.00 cm/s MV E velocity: 117.00 cm/s MV A velocity: 45.10 cm/s   SHUNTS MV E/A ratio:  2.59         Systemic VTI:  0.20 m                             Systemic Diam: 1.60 cm Toribio Fuel MD Electronically signed by Toribio Fuel MD Signature Date/Time: 07/09/2024/9:49:15 PM    Final    Assessment/Plan Sheila Estes is a 34 y.o. female with medical history significant for ischemic dilated cardiomyopathy EF 25%, grade 3 diastolic dysfunction, history of LV apical thrombus on Eliquis  who is being admitted to the hospital with sepsis and acute hypoxic respiratory failure due to multifocal pneumonia and RSV infection.   Sepsis-meeting criteria with tachycardia fever, mild hypotension.  Source is multifocal community-acquired pneumonia.  No evidence of endorgan dysfunction. -Inpatient admission -Monitor closely on telemetry -Empiric p.o. azithromycin , and IV Rocephin  -CT angio has ruled out PE, viral respiratory panel positive for RSV -Follow-up peripheral blood cultures -Given mild hypotension, she is being given a small 500 cc NS bolus, and I am holding her antihypertensive medications  Acute hypoxic respiratory  failure-due to RSV and multifocal pneumonia  Chronic heart failure with reduced EF-last echo shows dilated  cardiomyopathy EF 25%, as well as grade 3 diastolic dysfunction.  Overall the patient does appear euvolemic, with no overt signs of acute heart failure.  Home heart failure medications including Coreg  and Entresto  Aldactone  were resumed in the emergency department this morning.  However patient is hypotensive this afternoon upon arrival to [facility], with systolic blood pressure in the mid 90s. -Hold home Entresto , Aldactone , continue Coreg  with holding parameters -Hold Lasix  for the time being in the setting of her sepsis  RSV infection-supportive care, with supplemental oxygen  as above  History of apical clot in the setting of MI-continue Eliquis   DVT prophylaxis: Eliquis     Code Status: Full Code  Consults called: None  Admission status: The appropriate patient status for this patient is INPATIENT. Inpatient status is judged to be reasonable and necessary in order to provide the required intensity of service to ensure the patient's safety. The patient's presenting symptoms, physical exam findings, and initial radiographic and laboratory data in the context of their chronic comorbidities is felt to place them at high risk for further clinical deterioration. Furthermore, it is not anticipated that the patient will be medically stable for discharge from the hospital within 2 midnights of admission.    I certify that at the point of admission it is my clinical judgment that the patient will require inpatient hospital care spanning beyond 2 midnights from the point of admission due to high intensity of service, high risk for further deterioration and high frequency of surveillance required  Time spent: 48 minutes  Lollie Gunner CHRISTELLA Gail MD [facility] Pager [phone]  If 7PM-7AM, please contact night-coverage [email or web address] Password [email or web address]  07/10/2024, 4:03 PM      [1]   Allergies Allergen Reactions   Gluten Meal Shortness Of Breath and Other (See Comments)    Wheezing    Other Shortness Of Breath    Environmental allergies   "

## 2024-07-10 NOTE — ED Notes (Signed)
 Pt had removed all monitoring equipment, O2, and cardiac leads, stated it was tangled around her.  Explained that she must wear all leads, BP cuff, Pulse ox and O2 cannula, Pt understood.  Rt applied a bubbler to aid in nasal humidification through cannula also.

## 2024-07-11 DIAGNOSIS — J188 Other pneumonia, unspecified organism: Secondary | ICD-10-CM | POA: Diagnosis not present

## 2024-07-11 LAB — BASIC METABOLIC PANEL WITH GFR
Anion gap: 13 (ref 5–15)
BUN: 11 mg/dL (ref 6–20)
CO2: 23 mmol/L (ref 22–32)
Calcium: 8.9 mg/dL (ref 8.9–10.3)
Chloride: 101 mmol/L (ref 98–111)
Creatinine, Ser: 0.64 mg/dL (ref 0.44–1.00)
GFR, Estimated: >60 mL/min
Glucose, Bld: 89 mg/dL (ref 70–99)
Potassium: 3.4 mmol/L — ABNORMAL LOW (ref 3.5–5.1)
Sodium: 137 mmol/L (ref 135–145)

## 2024-07-11 LAB — CBC
HCT: 38.3 % (ref 36.0–46.0)
Hemoglobin: 13.3 g/dL (ref 12.0–15.0)
MCH: 32.6 pg (ref 26.0–34.0)
MCHC: 34.7 g/dL (ref 30.0–36.0)
MCV: 93.9 fL (ref 80.0–100.0)
Platelets: 159 K/uL (ref 150–400)
RBC: 4.08 MIL/uL (ref 3.87–5.11)
RDW: 13.2 % (ref 11.5–15.5)
WBC: 5.5 K/uL (ref 4.0–10.5)
nRBC: 0 % (ref 0.0–0.2)

## 2024-07-11 LAB — HIV ANTIBODY (ROUTINE TESTING W REFLEX): HIV Screen 4th Generation wRfx: NONREACTIVE

## 2024-07-11 MED ORDER — SACUBITRIL-VALSARTAN 97-103 MG PO TABS
1.0000 | ORAL_TABLET | Freq: Two times a day (BID) | ORAL | Status: DC
  Administered 2024-07-11 – 2024-07-12 (×3): 1 via ORAL
  Filled 2024-07-11 (×3): qty 1

## 2024-07-11 MED ORDER — SPIRONOLACTONE 25 MG PO TABS
25.0000 mg | ORAL_TABLET | Freq: Every day | ORAL | Status: DC
  Administered 2024-07-11 – 2024-07-12 (×2): 25 mg via ORAL
  Filled 2024-07-11 (×2): qty 1

## 2024-07-11 MED ORDER — FUROSEMIDE 10 MG/ML IJ SOLN
40.0000 mg | Freq: Once | INTRAMUSCULAR | Status: AC
  Administered 2024-07-11: 40 mg via INTRAVENOUS
  Filled 2024-07-11: qty 4

## 2024-07-11 MED ORDER — ROSUVASTATIN CALCIUM 10 MG PO TABS
40.0000 mg | ORAL_TABLET | Freq: Every day | ORAL | Status: DC
  Administered 2024-07-11 – 2024-07-12 (×2): 40 mg via ORAL
  Filled 2024-07-11 (×2): qty 4

## 2024-07-11 MED ORDER — FUROSEMIDE 40 MG PO TABS
40.0000 mg | ORAL_TABLET | Freq: Once | ORAL | Status: DC
  Filled 2024-07-11: qty 1

## 2024-07-11 MED ORDER — POTASSIUM CHLORIDE CRYS ER 20 MEQ PO TBCR
40.0000 meq | EXTENDED_RELEASE_TABLET | Freq: Once | ORAL | Status: AC
  Administered 2024-07-11: 40 meq via ORAL
  Filled 2024-07-11: qty 2

## 2024-07-11 NOTE — Plan of Care (Signed)

## 2024-07-11 NOTE — Progress Notes (Signed)
" °   07/11/24 0914  TOC Brief Assessment  Insurance and Status Reviewed  Patient has primary care physician Yes  Home environment has been reviewed Apartment  Prior level of function: Independent  Prior/Current Home Services No current home services  Social Drivers of Health Review SDOH reviewed no interventions necessary  Readmission risk has been reviewed Yes  Transition of care needs transition of care needs identified, TOC will continue to follow    Signed: Heather Saltness, MSW, LCSW Clinical Social Worker Inpatient Care Management 07/11/2024 9:14 AM   "

## 2024-07-11 NOTE — Progress Notes (Addendum)
 " PROGRESS NOTE    Sheila Estes  FMW:[id number] DOB: Jan 21, 1991 DOA: 07/09/2024 PCP: Celestia Rosaline SQUIBB, NP   Brief Narrative:  HPI: Sheila Estes is a 34 y.o. 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  Patient states that she has been having cough and shortness of breath for the last 4 days or so, she has also been having some fevers which started today in the emergency department.  Cough is productive of yellow sputum.  She denies any chest pain, she does have some abdominal fullness which is usually a sign of her heart failure, although it is not severe at all.  She is also having a little bit of orthopnea the last couple of days which is not normal for her.   Assessment & Plan:   Principal Problem:   Multifocal pneumonia  Sepsis and acute hypoxic respiratory failure secondary to multifocal pneumonia as well as RSV bronchitis-meeting sepsis criteria with tachycardia, fever, mild hypotension.  Source is multifocal community-acquired pneumonia and RSV bronchitis.  No evidence of endorgan dysfunction.  Last temperature spike around noon 07/10/2024.  Afebrile and no leukocytosis.  Overall feels better than yesterday and has been weaned to room air and was saturating 95% with ambulation when checked ambulatory oximetry today.  Patient thinks that she could go home but also says that she feels short of breath 3 hours after nebulizer treatment.  She further told me that she feels short of breath especially at night.  Husband at the bedside not comfortable with her going home like this.  They both eventually requested keeping overnight, which is acceptable.  Will continue current antibiotics, bronchodilators.   Chronic heart failure with reduced EF-last echo shows  dilated cardiomyopathy EF 25%, as well as grade 3 diastolic dysfunction.  Home heart failure medications including BiDil , Coreg  and Entresto , Lasix , Aldactone , they were resumed in the ED but she was hypotensive so Entresto , Aldactone  as well as Lasix  were held.  She was continued on Coreg .  Blood pressure is improving, renal function is normal.  She appears to be slightly fluid overloaded.  I will resume Aldactone , Lasix  as well as Entresto .  Hold Farxiga  and BiDil  for now.  Hypokalemia: Will replenish.   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  DVT prophylaxis: Eliquis    Code Status: Full Code  Family Communication: Husband present at bedside.  Plan of care discussed with patient in length and he/she verbalized understanding and agreed with it.  Status is: Inpatient Remains inpatient appropriate because: Still symptomatic   Estimated body mass index is 40.95 kg/m as calculated from the following:   Height as of this encounter: 5' 1 (1.549 m).   Weight as of this encounter: 98.3 kg.    Nutritional Assessment: Body mass index is 40.95 kg/m.SABRA Seen by dietician.  I agree with the assessment and plan as outlined below: Nutrition Status:        . Skin Assessment: I have examined the patient's skin and I agree with the wound assessment as performed by the wound care RN as outlined below:    Consultants:  None  Procedures:  None  Antimicrobials:  Anti-infectives (From admission, onward)    Start     Dose/Rate Route Frequency Ordered Stop  07/11/24 2200  azithromycin  (ZITHROMAX ) tablet 500 mg        500 mg Oral Daily at bedtime 07/10/24 1538     07/10/24 1000  cefTRIAXone  (ROCEPHIN ) 2 g in sodium chloride  0.9 % 100 mL IVPB        2 g 200 mL/hr over 30 Minutes Intravenous Every 24 hours 07/10/24 0255 07/16/24 0959   07/10/24 0100  cefTRIAXone  (ROCEPHIN ) 1 g in sodium chloride  0.9 % 100 mL IVPB        1 g 200 mL/hr  over 30 Minutes Intravenous  Once 07/10/24 0049 07/10/24 0144   07/10/24 0100  azithromycin  (ZITHROMAX ) 500 mg in sodium chloride  0.9 % 250 mL IVPB        500 mg 250 mL/hr over 60 Minutes Intravenous  Once 07/10/24 0049 07/10/24 0203         Subjective: Seen and examined, she says that she intermittently feels short of breath and would feel comfortable staying another night.  Objective: Vitals:   07/10/24 2221 07/10/24 2329 07/11/24 0507 07/11/24 0918  BP:  (!) 102/58 (!) 121/56   Pulse:  79 74   Resp:  18 17   Temp:  98.4 F (36.9 C) 98.2 F (36.8 C)   TempSrc:  Oral Oral   SpO2: 93% 94% 96% 97%  Weight:      Height:        Intake/Output Summary (Last 24 hours) at 07/11/2024 1055 Last data filed at 07/10/2024 1806 Gross per 24 hour  Intake 604.49 ml  Output --  Net 604.49 ml   Filed Weights   07/10/24 0749  Weight: 98.3 kg    Examination:  General exam: Appears calm and comfortable  Respiratory system: rhonchi bilaterally.  Respiratory effort normal. Cardiovascular system: S1 & S2 heard, RRR. No JVD, murmurs, rubs, gallops or clicks. No pedal edema. Gastrointestinal system: Abdomen is nondistended, soft and nontender. No organomegaly or masses felt. Normal bowel sounds heard. Central nervous system: Alert and oriented. No focal neurological deficits. Extremities: Symmetric 5 x 5 power. Skin: No rashes, lesions or ulcers Psychiatry: Judgement and insight appear normal. Mood & affect appropriate.    Data Reviewed: I have personally reviewed following labs and imaging studies  CBC: Recent Labs  Lab 07/09/24 2302 07/10/24 0309 07/11/24 0604  WBC 8.1 9.1 5.5  NEUTROABS 6.5  --   --   HGB 14.9 14.7 13.3  HCT 42.7 41.3 38.3  MCV 91.2 92.4 93.9  PLT 206 201 159   Basic Metabolic Panel: Recent Labs  Lab 07/09/24 2302 07/10/24 0309 07/11/24 0604  NA 136 136 137  K 3.4* 3.5 3.4*  CL 99 100 101  CO2 22 22 23   GLUCOSE 102* 107* 89  BUN 11 11 11    CREATININE 0.71 0.72 0.64  CALCIUM  9.5 9.3 8.9   GFR: Estimated Creatinine Clearance: 105.4 mL/min (by C-G formula based on SCr of 0.64 mg/dL). Liver Function Tests: Recent Labs  Lab 07/10/24 0309  AST 18  ALT 8  ALKPHOS 42  BILITOT 2.0*  PROT 8.1  ALBUMIN 4.5   No results for input(s): LIPASE, AMYLASE in the last 168 hours. No results for input(s): AMMONIA in the last 168 hours. Coagulation Profile: No results for input(s): INR, PROTIME in the last 168 hours. Cardiac Enzymes: No results for input(s): CKTOTAL, CKMB, CKMBINDEX, TROPONINI in the last 168 hours. BNP (last 3 results) Recent Labs    07/09/24 2302  PROBNP 451.0*   HbA1C: No results for input(s):  HGBA1C in the last 72 hours. CBG: No results for input(s): GLUCAP in the last 168 hours. Lipid Profile: No results for input(s): CHOL, HDL, LDLCALC, TRIG, CHOLHDL, LDLDIRECT in the last 72 hours. Thyroid Function Tests: No results for input(s): TSH, T4TOTAL, FREET4, T3FREE, THYROIDAB in the last 72 hours. Anemia Panel: No results for input(s): VITAMINB12, FOLATE, FERRITIN, TIBC, IRON, RETICCTPCT in the last 72 hours. Sepsis Labs: Recent Labs  Lab 07/09/24 2301  LATICACIDVEN 0.9    Recent Results (from the past 240 hours)  Resp panel by RT-PCR (RSV, Flu A&B, Covid) Anterior Nasal Swab     Status: Abnormal   Collection Time: 07/09/24 11:02 PM   Specimen: Anterior Nasal Swab  Result Value Ref Range Status   SARS Coronavirus 2 by RT PCR NEGATIVE NEGATIVE Final    Comment: (NOTE) SARS-CoV-2 target nucleic acids are NOT DETECTED.  The SARS-CoV-2 RNA is generally detectable in upper respiratory specimens during the acute phase of infection. The lowest concentration of SARS-CoV-2 viral copies this assay can detect is 138 copies/mL. A negative result does not preclude SARS-Cov-2 infection and should not be used as the sole basis for treatment or other  patient management decisions. A negative result may occur with  improper specimen collection/handling, submission of specimen other than nasopharyngeal swab, presence of viral mutation(s) within the areas targeted by this assay, and inadequate number of viral copies(<138 copies/mL). A negative result must be combined with clinical observations, patient history, and epidemiological information. The expected result is Negative.  Fact Sheet for Patients:  bloggercourse.com  Fact Sheet for Healthcare Providers:  seriousbroker.it  This test is no t yet approved or cleared by the [place]  FDA and  has been authorized for detection and/or diagnosis of SARS-CoV-2 by FDA under an Emergency Use Authorization (EUA). This EUA will remain  in effect (meaning this test can be used) for the duration of the COVID-19 declaration under Section 564(b)(1) of the Act, 21 U.S.C.section 360bbb-3(b)(1), unless the authorization is terminated  or revoked sooner.       Influenza A by PCR NEGATIVE NEGATIVE Final   Influenza B by PCR NEGATIVE NEGATIVE Final    Comment: (NOTE) The Xpert Xpress SARS-CoV-2/FLU/RSV plus assay is intended as an aid in the diagnosis of influenza from Nasopharyngeal swab specimens and should not be used as a sole basis for treatment. Nasal washings and aspirates are unacceptable for Xpert Xpress SARS-CoV-2/FLU/RSV testing.  Fact Sheet for Patients: bloggercourse.com  Fact Sheet for Healthcare Providers: seriousbroker.it  This test is not yet approved or cleared by the [place]  FDA and has been authorized for detection and/or diagnosis of SARS-CoV-2 by FDA under an Emergency Use Authorization (EUA). This EUA will remain in effect (meaning this test can be used) for the duration of the COVID-19 declaration under Section 564(b)(1) of the Act, 21 U.S.C. section  360bbb-3(b)(1), unless the authorization is terminated or revoked.     Resp Syncytial Virus by PCR POSITIVE (A) NEGATIVE Final    Comment: (NOTE) Fact Sheet for Patients: bloggercourse.com  Fact Sheet for Healthcare Providers: seriousbroker.it  This test is not yet approved or cleared by the [place]  FDA and has been authorized for detection and/or diagnosis of SARS-CoV-2 by FDA under an Emergency Use Authorization (EUA). This EUA will remain in effect (meaning this test can be used) for the duration of the COVID-19 declaration under Section 564(b)(1) of the Act, 21 U.S.C. section 360bbb-3(b)(1), unless the authorization is terminated or revoked.  Performed at [facility], [address], [place], [place] [address]   Culture, blood (routine x 2)     Status: None (Preliminary result)   Collection Time: 07/10/24  1:00 AM   Specimen: Left Antecubital; Blood  Result Value Ref Range Status   Specimen Description   Final    LEFT ANTECUBITAL Performed at [facility], [address] [place], [place] [address]    Special Requests   Final    BOTTLES DRAWN AEROBIC AND ANAEROBIC Blood Culture results may not be optimal due to an inadequate volume of blood received in culture bottles Performed at [facility], [address] [place], [place] [address]    Culture   Final    NO GROWTH < 24 HOURS Performed at [facility], [address] [place], [place] [address]    Report Status PENDING  Incomplete  Culture, blood (routine x 2)     Status: None (Preliminary result)   Collection Time: 07/10/24  1:01 AM   Specimen: BLOOD RIGHT HAND  Result Value Ref Range Status   Specimen Description   Final    BLOOD RIGHT HAND Performed at [facility], [address] [place], [place] [address]    Special Requests   Final    BOTTLES DRAWN AEROBIC AND  ANAEROBIC Blood Culture results may not be optimal due to an inadequate volume of blood received in culture bottles Performed at [facility], [address], [place], [place] [address]    Culture   Final    NO GROWTH < 24 HOURS Performed at [facility], [address] [place], [place] [address]    Report Status PENDING  Incomplete     Radiology Studies: CT Angio Chest PE W and/or Wo Contrast Result Date: 07/10/2024 EXAM: CTA CHEST 07/10/2024 12:27:47 AM TECHNIQUE: CTA of the chest was performed without and with the administration of 75 mL of intravenous contrast (iohexol  (OMNIPAQUE ) 350 MG/ML injection 75 mL IOHEXOL  350 MG/ML SOLN). Multiplanar reformatted images are provided for review. MIP images are provided for review. Automated exposure control, iterative reconstruction, and/or weight based adjustment of the mA/kV was utilized to reduce the radiation dose to as low as reasonably achievable. COMPARISON: Chest radiograph 07/09/2024 and CT chest 08/28/2018. CLINICAL HISTORY: Pulmonary embolism (PE) suspected, high prob; cp. FINDINGS: PULMONARY ARTERIES: There is good opacification of the central and segmental pulmonary arteries. No focal filling defects. No evidence of significant pulmonary embolus. Main pulmonary artery is normal in caliber. MEDIASTINUM: Cardiac pacemaker. Cardiac enlargement. Minimal pericardial effusion. Normal caliber thoracic aorta. The esophagus is decompressed. Thyroid gland is unremarkable. LYMPH NODES: No mediastinal, hilar or axillary lymphadenopathy. LUNGS AND PLEURA: Patchy central airspace opacities in the lungs likely representing multifocal pneumonia. Infiltrates are less prominent than on the prior study. This could represent recurrent acute multifocal pneumonia a recurrent chronic inflammatory process. No evidence of pleural effusion or pneumothorax. UPPER ABDOMEN: Limited images of the upper abdomen are unremarkable. SOFT TISSUES AND BONES:  No acute bone or soft tissue abnormality. IMPRESSION: 1. No evidence of pulmonary embolism. 2. Patchy central airspace opacities in the lungs, less prominent than on the prior study, likely representing multifocal pneumonia. This could represent recurrent acute multifocal pneumonia or a recurrent chronic inflammatory process. 3. Cardiomegaly with minimal pericardial effusion. Electronically signed by: Elsie Gravely MD 07/10/2024 12:33 AM EST RP Workstation: [id number]   DG Chest Portable 1 View Result Date: 07/09/2024 EXAM: 1 VIEW(S) XRAY OF THE CHEST 07/09/2024 11:36:00 PM COMPARISON:  08/27/2021 CLINICAL HISTORY: sob FINDINGS: LUNGS AND PLEURA: Prominent central pulmonary vessels. Hazy opacity at right lung base, possibly representing atelectasis, infection, or asymmetric edema. No pleural effusion. No pneumothorax. HEART AND MEDIASTINUM: Left AICD in place. Cardiomegaly. BONES AND SOFT TISSUES: No acute osseous abnormality. IMPRESSION: 1. Hazy opacity at the right lung base, which may represent atelectasis, infection, or asymmetric edema. 2. Cardiomegaly with prominent central pulmonary vessels and left AICD in place. Electronically signed by: Elsie Gravely MD 07/09/2024 11:42 PM EST RP Workstation: [id number]    Scheduled Meds:  apixaban   5 mg Oral BID   azithromycin   500 mg Oral QHS   carvedilol   25 mg Oral BID WC   ipratropium-albuterol   3 mL Nebulization TID   potassium chloride  SA  20 mEq Oral Daily   Continuous Infusions:  cefTRIAXone  (ROCEPHIN )  IV 2 g (07/11/24 0953)     LOS: 1 day   Fredia Skeeter, MD [facility]  07/11/2024, 10:55 AM   *Please note that this is a verbal dictation therefore any spelling or grammatical errors are due to the Dragon Medical One system interpretation.  Please page via Amion and do not message via secure chat for urgent patient care matters. Secure chat can be used for non urgent patient care matters.  How to contact the [facility] Attending or  Consulting provider 7A - 7P or covering provider during after hours 7P -7A, for this patient?  Check the care team in [facility] and look for a) attending/consulting [facility] provider listed and b) the [facility] team listed. Page or secure chat 7A-7P. Log into [email or web address] and use 's universal password to access. If you do not have the password, please contact the hospital operator. Locate the [facility] provider you are looking for under [facility] and page to a number that you can be directly reached. If you still have difficulty reaching the provider, please page the [facility] (Director on Call) for the Hospitalists listed on amion for assistance.  "

## 2024-07-12 ENCOUNTER — Other Ambulatory Visit (HOSPITAL_COMMUNITY): Payer: Self-pay

## 2024-07-12 DIAGNOSIS — J188 Other pneumonia, unspecified organism: Secondary | ICD-10-CM | POA: Diagnosis not present

## 2024-07-12 LAB — CBC WITH DIFFERENTIAL/PLATELET
Abs Immature Granulocytes: 0 K/uL (ref 0.00–0.07)
Basophils Absolute: 0 K/uL (ref 0.0–0.1)
Basophils Relative: 1 %
Eosinophils Absolute: 0.3 K/uL (ref 0.0–0.5)
Eosinophils Relative: 7 %
HCT: 42 % (ref 36.0–46.0)
Hemoglobin: 14.6 g/dL (ref 12.0–15.0)
Immature Granulocytes: 0 %
Lymphocytes Relative: 38 %
Lymphs Abs: 1.6 K/uL (ref 0.7–4.0)
MCH: 32.5 pg (ref 26.0–34.0)
MCHC: 34.8 g/dL (ref 30.0–36.0)
MCV: 93.5 fL (ref 80.0–100.0)
Monocytes Absolute: 0.5 K/uL (ref 0.1–1.0)
Monocytes Relative: 11 %
Neutro Abs: 1.9 K/uL (ref 1.7–7.7)
Neutrophils Relative %: 43 %
Platelets: 182 K/uL (ref 150–400)
RBC: 4.49 MIL/uL (ref 3.87–5.11)
RDW: 13.1 % (ref 11.5–15.5)
Smear Review: NORMAL
WBC: 4.3 K/uL (ref 4.0–10.5)
nRBC: 0 % (ref 0.0–0.2)

## 2024-07-12 LAB — BASIC METABOLIC PANEL WITH GFR
Anion gap: 11 (ref 5–15)
BUN: 12 mg/dL (ref 6–20)
CO2: 28 mmol/L (ref 22–32)
Calcium: 9.4 mg/dL (ref 8.9–10.3)
Chloride: 101 mmol/L (ref 98–111)
Creatinine, Ser: 0.7 mg/dL (ref 0.44–1.00)
GFR, Estimated: >60 mL/min
Glucose, Bld: 109 mg/dL — ABNORMAL HIGH (ref 70–99)
Potassium: 4.1 mmol/L (ref 3.5–5.1)
Sodium: 140 mmol/L (ref 135–145)

## 2024-07-12 MED ORDER — AMOXICILLIN-POT CLAVULANATE 875-125 MG PO TABS
1.0000 | ORAL_TABLET | Freq: Two times a day (BID) | ORAL | 0 refills | Status: AC
  Filled 2024-07-12: qty 10, 5d supply, fill #0

## 2024-07-12 NOTE — Progress Notes (Signed)
 Discharge instructions given to patient, IV and tele removed.

## 2024-07-12 NOTE — Discharge Summary (Signed)
 Physician Discharge Summary  [name]:[id number] DOB: February 16, 1991 DOA: 07/09/2024  PCP: Celestia Rosaline SQUIBB, NP  Admit date: 07/09/2024 Discharge date: 07/12/2024 30 Day Unplanned Readmission Risk Score    Flowsheet Row ED to Hosp-Admission (Current) from 07/09/2024 in [facility]  30 Day Unplanned Readmission Risk Score (%) 9.26 Filed at 07/12/2024 0801    This score is the patient's risk of an unplanned readmission within 30 days of being discharged (0 -100%). The score is based on dignosis, age, lab data, medications, orders, and past utilization.   Low:  0-14.9   Medium: 15-21.9   High: 22-29.9   Extreme: 30 and above          Admitted From: Home Disposition: Home  Recommendations for Outpatient Follow-up:  Follow up with PCP in 1-2 weeks Please obtain BMP/CBC in one week Please follow up with your PCP on the following pending results: Unresulted Labs (From admission, onward)    None         Home Health: None Equipment/Devices: None  Discharge Condition: Stable CODE STATUS: Full code Diet recommendation:  Diet Order             Diet Heart Room service appropriate? Yes; Fluid consistency: Thin  Diet effective now                   Subjective: Patient seen and examined, husband at the bedside as well as the nurse.  Patient states that she is feeling much better.  She had a great night without any shortness of breath and she slept well as well and she feels much more comfortable today going home.  Brief/Interim Summary: Sheila Estes is a 34 y.o. 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   Source is multifocal community-acquired pneumonia and RSV bronchitis.  No evidence of endorgan dysfunction.  Last temperature spike around noon 07/10/2024.  Afebrile and no leukocytosis since last 2 days.  Was weaned to room air more than 24 hours ago, doing well, has mild wheezes on my examination but she tells me that these mild wheezes are baseline for her due to history of asthma and she denies any shortness of breath.  No indication of steroids if these are baseline.  She received IV antibiotics while here, discharged on 5 more days of oral Augmentin .    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  Home heart failure medications including BiDil , Coreg  and Entresto , Lasix , Aldactone , they were resumed in the ED but she was hypotensive so Entresto , Aldactone  as well as Lasix  were held.  She was continued on Coreg .  Blood pressure is improving, renal function is normal.  Resuming all prior home medications.   Hypokalemia: Resolved.   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  Discharge plan was discussed with patient and/or family member and they verbalized understanding and agreed with it.  Discharge Diagnoses:  Principal Problem:   Multifocal pneumonia    Discharge Instructions   Allergies as of 07/12/2024       Reactions   Gluten Meal Shortness Of Breath,  Other (See Comments)   Wheezing    Other Shortness Of Breath   Environmental allergies        Medication List     STOP taking these medications    metroNIDAZOLE  500 MG tablet Commonly known as: FLAGYL        TAKE these medications    albuterol  108 (90 Base) MCG/ACT inhaler Commonly known as: VENTOLIN  HFA Inhale 2 puffs into the lungs every 6 (six) hours as needed for wheezing or shortness of breath.   amoxicillin -clavulanate 875-125 MG tablet Commonly known as: AUGMENTIN  Take 1  tablet by mouth 2 (two) times daily for 5 days.   BiDil  20-37.5 MG tablet Generic drug: isosorbide-hydrALAZINE Take 1 tablet by mouth 3 (three) times daily. What changed: how much to take   carvedilol  25 MG tablet Commonly known as: COREG  Take 1 tablet (25 mg total) by mouth 2 (two) times daily with a meal.   dapagliflozin  propanediol 10 MG Tabs tablet Commonly known as: FARXIGA  TAKE 1 TABLET(10 MG) BY MOUTH DAILY BEFORE BREAKFAST   Eliquis  5 MG Tabs tablet Generic drug: apixaban  TAKE 1 TABLET(5 MG) BY MOUTH TWICE DAILY   Entresto  97-103 MG Generic drug: sacubitril -valsartan  TAKE 1 TABLET BY MOUTH TWICE DAILY   furosemide  20 MG tablet Commonly known as: LASIX  Take 2 tablets (40 mg total) by mouth daily. What changed:  when to take this reasons to take this   potassium chloride  SA 20 MEQ tablet Commonly known as: KLOR-CON  M Take 1 tablet (20 mEq total) by mouth daily. With Lasix  What changed:  when to take this reasons to take this additional instructions   rosuvastatin  40 MG tablet Commonly known as: CRESTOR  Take 1 tablet (40 mg total) by mouth daily.   spironolactone  25 MG tablet Commonly known as: ALDACTONE  Take 1 tablet (25 mg total) by mouth daily.        Follow-up Information     Celestia Rosaline SQUIBB, NP Follow up in 1 week(s).   Specialty: Internal Medicine Contact information: [id number] [place] [address] [phone]                Allergies[1]  Consultations: None   Procedures/Studies: CT Angio Chest PE W and/or Wo Contrast Result Date: 07/10/2024 EXAM: CTA CHEST 07/10/2024 12:27:47 AM TECHNIQUE: CTA of the chest was performed without and with the administration of 75 mL of intravenous contrast (iohexol  (OMNIPAQUE ) 350 MG/ML injection 75 mL IOHEXOL  350 MG/ML SOLN). Multiplanar reformatted images are provided for review. MIP images are provided for review. Automated exposure control, iterative reconstruction, and/or weight  based adjustment of the mA/kV was utilized to reduce the radiation dose to as low as reasonably achievable. COMPARISON: Chest radiograph 07/09/2024 and CT chest 08/28/2018. CLINICAL HISTORY: Pulmonary embolism (PE) suspected, high prob; cp. FINDINGS: PULMONARY ARTERIES: There is good opacification of the central and segmental pulmonary arteries. No focal filling defects. No evidence of significant pulmonary embolus. Main pulmonary artery is normal in caliber. MEDIASTINUM: Cardiac pacemaker. Cardiac enlargement. Minimal pericardial effusion. Normal caliber thoracic aorta. The esophagus is decompressed. Thyroid gland is unremarkable. LYMPH NODES: No mediastinal, hilar or axillary lymphadenopathy. LUNGS AND PLEURA: Patchy central airspace opacities in the lungs likely representing multifocal pneumonia. Infiltrates are less prominent than on the prior study. This could represent recurrent acute multifocal pneumonia a recurrent chronic inflammatory process. No evidence of pleural effusion or pneumothorax. UPPER ABDOMEN: Limited images of the upper abdomen are unremarkable. SOFT TISSUES AND BONES: No acute bone or soft tissue abnormality. IMPRESSION:  1. No evidence of pulmonary embolism. 2. Patchy central airspace opacities in the lungs, less prominent than on the prior study, likely representing multifocal pneumonia. This could represent recurrent acute multifocal pneumonia or a recurrent chronic inflammatory process. 3. Cardiomegaly with minimal pericardial effusion. Electronically signed by: Elsie Gravely MD 07/10/2024 12:33 AM EST RP Workstation: [id number]   DG Chest Portable 1 View Result Date: 07/09/2024 EXAM: 1 VIEW(S) XRAY OF THE CHEST 07/09/2024 11:36:00 PM COMPARISON: 08/27/2021 CLINICAL HISTORY: sob FINDINGS: LUNGS AND PLEURA: Prominent central pulmonary vessels. Hazy opacity at right lung base, possibly representing atelectasis, infection, or asymmetric edema. No pleural effusion. No pneumothorax. HEART  AND MEDIASTINUM: Left AICD in place. Cardiomegaly. BONES AND SOFT TISSUES: No acute osseous abnormality. IMPRESSION: 1. Hazy opacity at the right lung base, which may represent atelectasis, infection, or asymmetric edema. 2. Cardiomegaly with prominent central pulmonary vessels and left AICD in place. Electronically signed by: Elsie Gravely MD 07/09/2024 11:42 PM EST RP Workstation: [id number]   ECHOCARDIOGRAM COMPLETE Result Date: 07/09/2024    ECHOCARDIOGRAM REPORT   Patient Name:   Sheila Estes Date of Exam: 07/09/2024 Medical Rec #:  [id number]        Height:       61.0 in Accession #:    [id number]       Weight:       216.8 lb Date of Birth:  January 30, 1991        BSA:          1.955 m Patient Age:    33 years         BP:           128/82 mmHg Patient Gender: F                HR:           81 bpm. Exam Location:  Outpatient Procedure: 2D Echo (Both Spectral and Color Flow Doppler were utilized during            procedure). Indications:    congestive heart failure  History:        Patient has prior history of Echocardiogram examinations, most                 recent 06/23/2023. Cardiomyopathy; Defibrillator.  Sonographer:    Tinnie Barefoot RDCS Referring Phys: [phone] EZRA RAMAN [facility]  Sonographer Comments: Image acquisition challenging due to patient body habitus. IMPRESSIONS  1. Left ventricular ejection fraction, by estimation, is 25 to 30%. The left ventricle has severely decreased function. The left ventricle demonstrates regional wall motion abnormalities (see scoring diagram/findings for description). The left ventricular internal cavity size was mildly to moderately dilated. Left ventricular diastolic parameters are consistent with Grade III diastolic dysfunction (restrictive).  2. Right ventricular systolic function is mildly reduced. The right ventricular size is mildly enlarged. There is mildly elevated pulmonary artery systolic pressure.  3. Left atrial size was mild to moderately dilated.  4. The  mitral valve is normal in structure. Trivial mitral valve regurgitation. No evidence of mitral stenosis.  5. The aortic valve is normal in structure. Aortic valve regurgitation is not visualized. No aortic stenosis is present.  6. Pulmonic valve regurgitation is moderate to severe.  7. The inferior vena cava is normal in size with greater than 50% respiratory variability, suggesting right atrial pressure of 3 mmHg. FINDINGS  Left Ventricle: Left ventricular ejection fraction, by estimation, is 25 to 30%. The left ventricle has severely decreased function. The left ventricle demonstrates  regional wall motion abnormalities. Definity  contrast agent was given IV to delineate the left ventricular endocardial borders. The left ventricular internal cavity size was mildly to moderately dilated. There is no left ventricular hypertrophy. Left ventricular diastolic parameters are consistent with Grade III diastolic dysfunction (restrictive).  LV Wall Scoring: The mid and distal anterior wall, mid and distal lateral wall, entire anterior septum, entire apex, and mid and distal inferior wall are akinetic. Right Ventricle: The right ventricular size is mildly enlarged. No increase in right ventricular wall thickness. Right ventricular systolic function is mildly reduced. There is mildly elevated pulmonary artery systolic pressure. The tricuspid regurgitant  velocity is 2.91 m/s, and with an assumed right atrial pressure of 3 mmHg, the estimated right ventricular systolic pressure is 36.9 mmHg. Left Atrium: Left atrial size was mild to moderately dilated. Right Atrium: Right atrial size was normal in size. Pericardium: There is no evidence of pericardial effusion. Mitral Valve: The mitral valve is normal in structure. Trivial mitral valve regurgitation. No evidence of mitral valve stenosis. Tricuspid Valve: The tricuspid valve is normal in structure. Tricuspid valve regurgitation is mild . No evidence of tricuspid stenosis. Aortic  Valve: The aortic valve is normal in structure. Aortic valve regurgitation is not visualized. No aortic stenosis is present. Pulmonic Valve: The pulmonic valve was normal in structure. Pulmonic valve regurgitation is moderate to severe. No evidence of pulmonic stenosis. Aorta: The aortic root is normal in size and structure. Venous: The inferior vena cava is normal in size with greater than 50% respiratory variability, suggesting right atrial pressure of 3 mmHg. IAS/Shunts: No atrial level shunt detected by color flow Doppler. Additional Comments: A device lead is visualized.  LEFT VENTRICLE PLAX 2D LVIDd:         6.10 cm      Diastology LVIDs:         4.70 cm      LV e' medial:    4.24 cm/s LV PW:         1.10 cm      LV E/e' medial:  27.6 LV IVS:        0.90 cm      LV e' lateral:   6.53 cm/s LVOT diam:     1.60 cm      LV E/e' lateral: 17.9 LV SV:         41 LV SV Index:   21 LVOT Area:     2.01 cm LV IVRT:       81 msec  LV Volumes (MOD) LV vol d, MOD A2C: 204.0 ml LV vol d, MOD A4C: 193.0 ml LV vol s, MOD A2C: 168.0 ml LV vol s, MOD A4C: 118.0 ml LV SV MOD A2C:     36.0 ml LV SV MOD A4C:     193.0 ml LV SV MOD BP:      57.8 ml RIGHT VENTRICLE            IVC RV Basal diam:  2.70 cm    IVC diam: 1.50 cm RV S prime:     8.59 cm/s TAPSE (M-mode): 1.5 cm     PULMONARY VEINS                            Diastolic Velocity: 59.40 cm/s                            S/D  Velocity:       0.50                            Systolic Velocity:  27.70 cm/s LEFT ATRIUM             Index        RIGHT ATRIUM           Index LA diam:        4.10 cm 2.10 cm/m   RA Area:     11.90 cm LA Vol (A2C):   65.4 ml 33.46 ml/m  RA Volume:   25.80 ml  13.20 ml/m LA Vol (A4C):   58.6 ml 29.98 ml/m LA Biplane Vol: 62.8 ml 32.13 ml/m  AORTIC VALVE LVOT Vmax:   115.00 cm/s LVOT Vmean:  75.500 cm/s LVOT VTI:    0.204 m  AORTA Ao Root diam: 2.00 cm Ao Asc diam:  2.70 cm MITRAL VALVE                TRICUSPID VALVE MV Area (PHT): 6.54 cm     TR Peak  grad:   33.9 mmHg MV Decel Time: 116 msec     TR Vmax:        291.00 cm/s MV E velocity: 117.00 cm/s MV A velocity: 45.10 cm/s   SHUNTS MV E/A ratio:  2.59         Systemic VTI:  0.20 m                             Systemic Diam: 1.60 cm Toribio Fuel MD Electronically signed by Toribio Fuel MD Signature Date/Time: 07/09/2024/9:49:15 PM    Final    CUP PACEART REMOTE DEVICE CHECK Result Date: 06/21/2024 ICD Scheduled remote reviewed. Normal device function.  Presenting rhythm: VS HF diagnostics have been abnormal this monitoring period.  Currently abnormal. Next remote transmission per protocol. Williams, CVRS    Discharge Exam: Vitals:   07/11/24 2121 07/12/24 0516  BP: (!) 106/55 106/66  Pulse: 63 (!) 57  Resp: 18 17  Temp: 97.7 F (36.5 C) 98.5 F (36.9 C)  SpO2: 95% 97%   Vitals:   07/11/24 0918 07/11/24 1810 07/11/24 2121 07/12/24 0516  BP:  (!) 105/59 (!) 106/55 106/66  Pulse:  62 63 (!) 57  Resp:  15 18 17   Temp:  97.9 F (36.6 C) 97.7 F (36.5 C) 98.5 F (36.9 C)  TempSrc:  Oral  Oral  SpO2: 97% 93% 95% 97%  Weight:      Height:        General: Pt is alert, awake, not in acute distress Cardiovascular: RRR, S1/S2 +, no rubs, no gallops Respiratory: CTA bilaterally, mild inspiratory and expiratory wheezes, no rhonchi or crackles. Abdominal: Soft, NT, ND, bowel sounds + Extremities: no edema, no cyanosis    The results of significant diagnostics from this hospitalization (including imaging, microbiology, ancillary and laboratory) are listed below for reference.     Microbiology: Recent Results (from the past 240 hours)  Resp panel by RT-PCR (RSV, Flu A&B, Covid) Anterior Nasal Swab     Status: Abnormal   Collection Time: 07/09/24 11:02 PM   Specimen: Anterior Nasal Swab  Result Value Ref Range Status   SARS Coronavirus 2 by RT PCR NEGATIVE NEGATIVE Final    Comment: (NOTE) SARS-CoV-2 target nucleic acids are NOT DETECTED.  The SARS-CoV-2 RNA is generally  detectable in upper respiratory specimens during the acute phase of infection. The lowest concentration of SARS-CoV-2 viral copies this assay can detect is 138 copies/mL. A negative result does not preclude SARS-Cov-2 infection and should not be used as the sole basis for treatment or other patient management decisions. A negative result may occur with  improper specimen collection/handling, submission of specimen other than nasopharyngeal swab, presence of viral mutation(s) within the areas targeted by this assay, and inadequate number of viral copies(<138 copies/mL). A negative result must be combined with clinical observations, patient history, and epidemiological information. The expected result is Negative.  Fact Sheet for Patients:  bloggercourse.com  Fact Sheet for Healthcare Providers:  seriousbroker.it  This test is no t yet approved or cleared by the [place]  FDA and  has been authorized for detection and/or diagnosis of SARS-CoV-2 by FDA under an Emergency Use Authorization (EUA). This EUA will remain  in effect (meaning this test can be used) for the duration of the COVID-19 declaration under Section 564(b)(1) of the Act, 21 U.S.C.section 360bbb-3(b)(1), unless the authorization is terminated  or revoked sooner.       Influenza A by PCR NEGATIVE NEGATIVE Final   Influenza B by PCR NEGATIVE NEGATIVE Final    Comment: (NOTE) The Xpert Xpress SARS-CoV-2/FLU/RSV plus assay is intended as an aid in the diagnosis of influenza from Nasopharyngeal swab specimens and should not be used as a sole basis for treatment. Nasal washings and aspirates are unacceptable for Xpert Xpress SARS-CoV-2/FLU/RSV testing.  Fact Sheet for Patients: bloggercourse.com  Fact Sheet for Healthcare Providers: seriousbroker.it  This test is not yet approved or cleared by the [place]  FDA  and has been authorized for detection and/or diagnosis of SARS-CoV-2 by FDA under an Emergency Use Authorization (EUA). This EUA will remain in effect (meaning this test can be used) for the duration of the COVID-19 declaration under Section 564(b)(1) of the Act, 21 U.S.C. section 360bbb-3(b)(1), unless the authorization is terminated or revoked.     Resp Syncytial Virus by PCR POSITIVE (A) NEGATIVE Final    Comment: (NOTE) Fact Sheet for Patients: bloggercourse.com  Fact Sheet for Healthcare Providers: seriousbroker.it  This test is not yet approved or cleared by the [place]  FDA and has been authorized for detection and/or diagnosis of SARS-CoV-2 by FDA under an Emergency Use Authorization (EUA). This EUA will remain in effect (meaning this test can be used) for the duration of the COVID-19 declaration under Section 564(b)(1) of the Act, 21 U.S.C. section 360bbb-3(b)(1), unless the authorization is terminated or revoked.  Performed at [facility], [address] [place], [place] [address]   Culture, blood (routine x 2)     Status: None (Preliminary result)   Collection Time: 07/10/24  1:00 AM   Specimen: Left Antecubital; Blood  Result Value Ref Range Status   Specimen Description   Final    LEFT ANTECUBITAL Performed at [facility], [address], [place], [place] [address]    Special Requests   Final    BOTTLES DRAWN AEROBIC AND ANAEROBIC Blood Culture results may not be optimal due to an inadequate volume of blood received in culture bottles Performed at [facility], [address] [place], [place] [address]    Culture   Final    NO GROWTH 2 DAYS Performed at [facility], [address] [place], [place] [address]    Report Status PENDING  Incomplete  Culture, blood (routine x 2)  Status: None (Preliminary result)   Collection Time:  07/10/24  1:01 AM   Specimen: BLOOD RIGHT HAND  Result Value Ref Range Status   Specimen Description   Final    BLOOD RIGHT HAND Performed at [facility], [address] [place], [place] [address]    Special Requests   Final    BOTTLES DRAWN AEROBIC AND ANAEROBIC Blood Culture results may not be optimal due to an inadequate volume of blood received in culture bottles Performed at [facility], [address] [place], [place] [address]    Culture   Final    NO GROWTH 2 DAYS Performed at [facility], [address] [place], [place] [address]    Report Status PENDING  Incomplete     Labs: BNP (last 3 results) Recent Labs    09/21/23 1152 01/11/24 1129 05/02/24 1609  BNP 256.8* 195.9* 274.0*   Basic Metabolic Panel: Recent Labs  Lab 07/09/24 2302 07/10/24 0309 07/11/24 0604 07/12/24 0624  NA 136 136 137 140  K 3.4* 3.5 3.4* 4.1  CL 99 100 101 101  CO2 22 22 23 28   GLUCOSE 102* 107* 89 109*  BUN 11 11 11 12   CREATININE 0.71 0.72 0.64 0.70  CALCIUM  9.5 9.3 8.9 9.4   Liver Function Tests: Recent Labs  Lab 07/10/24 0309  AST 18  ALT 8  ALKPHOS 42  BILITOT 2.0*  PROT 8.1  ALBUMIN 4.5   No results for input(s): LIPASE, AMYLASE in the last 168 hours. No results for input(s): AMMONIA in the last 168 hours. CBC: Recent Labs  Lab 07/09/24 2302 07/10/24 0309 07/11/24 0604 07/12/24 0624  WBC 8.1 9.1 5.5 4.3  NEUTROABS 6.5  --   --  1.9  HGB 14.9 14.7 13.3 14.6  HCT 42.7 41.3 38.3 42.0  MCV 91.2 92.4 93.9 93.5  PLT 206 201 159 182   Cardiac Enzymes: No results for input(s): CKTOTAL, CKMB, CKMBINDEX, TROPONINI in the last 168 hours. BNP: Invalid input(s): POCBNP CBG: No results for input(s): GLUCAP in the last 168 hours. D-Dimer No results for input(s): DDIMER in the last 72 hours. Hgb A1c No results for input(s): HGBA1C in the last 72 hours. Lipid Profile No results for input(s):  CHOL, HDL, LDLCALC, TRIG, CHOLHDL, LDLDIRECT in the last 72 hours. Thyroid function studies No results for input(s): TSH, T4TOTAL, T3FREE, THYROIDAB in the last 72 hours.  Invalid input(s): FREET3 Anemia work up No results for input(s): VITAMINB12, FOLATE, FERRITIN, TIBC, IRON, RETICCTPCT in the last 72 hours. Urinalysis    Component Value Date/Time   COLORURINE YELLOW 07/05/2015 1611   APPEARANCEUR CLEAR 07/05/2015 1611   LABSPEC 1.013 07/05/2015 1611   PHURINE 7.0 07/05/2015 1611   GLUCOSEU NEGATIVE 07/05/2015 1611   HGBUR LARGE (A) 07/05/2015 1611   BILIRUBINUR NEGATIVE 07/05/2015 1611   KETONESUR NEGATIVE 07/05/2015 1611   PROTEINUR NEGATIVE 07/05/2015 1611   UROBILINOGEN 1.0 03/17/2013 1356   NITRITE NEGATIVE 07/05/2015 1611   LEUKOCYTESUR NEGATIVE 07/05/2015 1611   Sepsis Labs Recent Labs  Lab 07/09/24 2302 07/10/24 0309 07/11/24 0604 07/12/24 0624  WBC 8.1 9.1 5.5 4.3   Microbiology Recent Results (from the past 240 hours)  Resp panel by RT-PCR (RSV, Flu A&B, Covid) Anterior Nasal Swab     Status: Abnormal   Collection Time: 07/09/24 11:02 PM   Specimen: Anterior Nasal Swab  Result Value Ref Range Status   SARS Coronavirus 2 by RT PCR NEGATIVE NEGATIVE Final  Comment: (NOTE) SARS-CoV-2 target nucleic acids are NOT DETECTED.  The SARS-CoV-2 RNA is generally detectable in upper respiratory specimens during the acute phase of infection. The lowest concentration of SARS-CoV-2 viral copies this assay can detect is 138 copies/mL. A negative result does not preclude SARS-Cov-2 infection and should not be used as the sole basis for treatment or other patient management decisions. A negative result may occur with  improper specimen collection/handling, submission of specimen other than nasopharyngeal swab, presence of viral mutation(s) within the areas targeted by this assay, and inadequate number of viral copies(<138 copies/mL). A  negative result must be combined with clinical observations, patient history, and epidemiological information. The expected result is Negative.  Fact Sheet for Patients:  bloggercourse.com  Fact Sheet for Healthcare Providers:  seriousbroker.it  This test is no t yet approved or cleared by the [place]  FDA and  has been authorized for detection and/or diagnosis of SARS-CoV-2 by FDA under an Emergency Use Authorization (EUA). This EUA will remain  in effect (meaning this test can be used) for the duration of the COVID-19 declaration under Section 564(b)(1) of the Act, 21 U.S.C.section 360bbb-3(b)(1), unless the authorization is terminated  or revoked sooner.       Influenza A by PCR NEGATIVE NEGATIVE Final   Influenza B by PCR NEGATIVE NEGATIVE Final    Comment: (NOTE) The Xpert Xpress SARS-CoV-2/FLU/RSV plus assay is intended as an aid in the diagnosis of influenza from Nasopharyngeal swab specimens and should not be used as a sole basis for treatment. Nasal washings and aspirates are unacceptable for Xpert Xpress SARS-CoV-2/FLU/RSV testing.  Fact Sheet for Patients: bloggercourse.com  Fact Sheet for Healthcare Providers: seriousbroker.it  This test is not yet approved or cleared by the [place]  FDA and has been authorized for detection and/or diagnosis of SARS-CoV-2 by FDA under an Emergency Use Authorization (EUA). This EUA will remain in effect (meaning this test can be used) for the duration of the COVID-19 declaration under Section 564(b)(1) of the Act, 21 U.S.C. section 360bbb-3(b)(1), unless the authorization is terminated or revoked.     Resp Syncytial Virus by PCR POSITIVE (A) NEGATIVE Final    Comment: (NOTE) Fact Sheet for Patients: bloggercourse.com  Fact Sheet for Healthcare  Providers: seriousbroker.it  This test is not yet approved or cleared by the [place]  FDA and has been authorized for detection and/or diagnosis of SARS-CoV-2 by FDA under an Emergency Use Authorization (EUA). This EUA will remain in effect (meaning this test can be used) for the duration of the COVID-19 declaration under Section 564(b)(1) of the Act, 21 U.S.C. section 360bbb-3(b)(1), unless the authorization is terminated or revoked.  Performed at [facility], [address], [place], [place] [address]   Culture, blood (routine x 2)     Status: None (Preliminary result)   Collection Time: 07/10/24  1:00 AM   Specimen: Left Antecubital; Blood  Result Value Ref Range Status   Specimen Description   Final    LEFT ANTECUBITAL Performed at [facility], [address] [place], [place] [address]    Special Requests   Final    BOTTLES DRAWN AEROBIC AND ANAEROBIC Blood Culture results may not be optimal due to an inadequate volume of blood received in culture bottles Performed at [facility], [address] [place], [place] [address]    Culture   Final    NO GROWTH 2 DAYS Performed at [facility], [address] [place], [place] [address]  Report Status PENDING  Incomplete  Culture, blood (routine x 2)     Status: None (Preliminary result)   Collection Time: 07/10/24  1:01 AM   Specimen: BLOOD RIGHT HAND  Result Value Ref Range Status   Specimen Description   Final    BLOOD RIGHT HAND Performed at [facility], [address] [place], [place] [address]    Special Requests   Final    BOTTLES DRAWN AEROBIC AND ANAEROBIC Blood Culture results may not be optimal due to an inadequate volume of blood received in culture bottles Performed at [facility], [address], [place], [place] [address]    Culture   Final    NO GROWTH 2  DAYS Performed at [facility], [address], [place], [place] [address]    Report Status PENDING  Incomplete    FURTHER DISCHARGE INSTRUCTIONS:   Get Medicines reviewed and adjusted: Please take all your medications with you for your next visit with your Primary MD   Laboratory/radiological data: Please request your Primary MD to go over all hospital tests and procedure/radiological results at the follow up, please ask your Primary MD to get all Hospital records sent to his/her office.   In some cases, they will be blood work, cultures and biopsy results pending at the time of your discharge. Please request that your primary care M.D. goes through all the records of your hospital data and follows up on these results.   Also Note the following: If you experience worsening of your admission symptoms, develop shortness of breath, life threatening emergency, suicidal or homicidal thoughts you must seek medical attention immediately by calling 911 or calling your MD immediately  if symptoms less severe.   You must read complete instructions/literature along with all the possible adverse reactions/side effects for all the Medicines you take and that have been prescribed to you. Take any new Medicines after you have completely understood and accpet all the possible adverse reactions/side effects.    patient was instructed, not to drive, operate heavy machinery, perform activities at heights, swimming or participation in water activities or provide baby-sitting services while on Pain, Sleep and Anxiety Medications; until their outpatient Physician has advised to do so again. Also recommended to not to take more than prescribed Pain, Sleep and Anxiety Medications.  It is not advisable to combine anxiety, sleep and pain medications without talking with your primary care provider.     Wear Seat belts while driving.   Please note: You were cared for by a hospitalist during your hospital stay.  Once you are discharged, your primary care physician will handle any further medical issues. Please note that NO REFILLS for any discharge medications will be authorized once you are discharged, as it is imperative that you return to your primary care physician (or establish a relationship with a primary care physician if you do not have one) for your post hospital discharge needs so that they can reassess your need for medications and monitor your lab values  Time coordinating discharge: Over 30 minutes  SIGNED:   Fredia Skeeter, MD  [facility] 07/12/2024, 8:46 AM *Please note that this is a verbal dictation therefore any spelling or grammatical errors are due to the Dragon Medical One system interpretation. If 7PM-7AM, please contact night-coverage [email or web address]     [1]  Allergies Allergen Reactions   Gluten Meal Shortness Of Breath and Other (See Comments)    Wheezing    Other Shortness Of Breath  Environmental allergies

## 2024-07-12 NOTE — Plan of Care (Signed)

## 2024-07-12 NOTE — Progress Notes (Signed)
 Discharge medications delivered to patient at the bedside in a secure bag.

## 2024-07-15 LAB — CULTURE, BLOOD (ROUTINE X 2)
Culture: NO GROWTH
Culture: NO GROWTH

## 2024-07-16 ENCOUNTER — Telehealth: Payer: Self-pay

## 2024-07-16 NOTE — Transitions of Care (Post Inpatient/ED Visit) (Signed)
 "  07/16/2024  Name: Sheila Estes MRN: [id number] DOB: 09-29-1990  Today's TOC FU Call Status: Today's TOC FU Call Status:: Successful TOC FU Call Completed TOC FU Call Complete Date: 07/16/24  Patient's Name and Date of Birth confirmed. DOB, Name  Transition Care Management Follow-up Telephone Call Date of Discharge: 07/12/24 Discharge Facility: [facility]) Type of Discharge: Inpatient Admission Primary Inpatient Discharge Diagnosis:: multifocal pneumonia How have you been since you were released from the hospital?: Better (She stated she is feeling much better) Any questions or concerns?: Yes Patient Questions/Concerns:: She stated she has a nebulizer but is in need of neb solution.  She explained that she has had asthma for years and she uses the albuterol  inhaler and nebulizer when needed to manage episodes of shortness of breath.  She also stated that she understands if the inhaler and nebulizer are not effective, she needs to go to the ED.   She stated she ran out of the nebulizer sokution that she had for a few years.  I told her that I will notify her PCP. She said she spoke to cardiology about this and the provider referred her back to her PCP. She said she spoke to [place] about this and she told her that she would place a referral to pulmonary. Patient Questions/Concerns Addressed: Notified Provider of Patient Questions/Concerns  Items Reviewed: Did you receive and understand the discharge instructions provided?: Yes Medications obtained,verified, and reconciled?: Yes (Medications Reviewed) (She stated she has all medications and will need to get some of them re-filled.) Any new allergies since your discharge?: No Dietary orders reviewed?: Yes Type of Diet Ordered:: heart healthy low sodium Do you have support at home?: Yes People in Home [RPT]: spouse Name of Support/Comfort Primary Source: her husband  Medications Reviewed Today: Medications Reviewed Today      Reviewed by Marvis Bradley, RN (Case Manager) on 07/16/24 at 1110  Med List Status: <None>   Medication Order Taking? Sig Documenting Provider Last Dose Status Informant  albuterol  (VENTOLIN  HFA) 108 (90 Base) MCG/ACT inhaler [id number] No Inhale 2 puffs into the lungs every 6 (six) hours as needed for wheezing or shortness of breath. Celestia Rosaline SQUIBB, NP 07/09/2024 Active Self, Pharmacy Records  amoxicillin -clavulanate (AUGMENTIN ) 875-125 MG tablet [id number]  Take 1 tablet by mouth 2 (two) times daily for 5 days. Vernon Ranks, MD  Active   apixaban  (ELIQUIS ) 5 MG TABS tablet [id number] No TAKE 1 TABLET(5 MG) BY MOUTH TWICE DAILY Rolan Ezra RAMAN, MD 07/03/2024 Active Self, Pharmacy Records  BIDIL  20-37.5 MG tablet [id number] No Take 1 tablet by mouth 3 (three) times daily.  Patient taking differently: Take 0.5 tablets by mouth 3 (three) times daily.   Rolan Ezra RAMAN, MD Past Week Active Self, Pharmacy Records           Med Note (CRUTHIS, SHEFFIELD BROCKS   Wed Jul 11, 2024 10:50 AM)  Pt stated she has been taking 0.5 tablet once daily. Then when questioned why she stated she has been taking 0.5 tablet TID. Pt verified this x4.   carvedilol  (COREG ) 25 MG tablet [id number] No Take 1 tablet (25 mg total) by mouth 2 (two) times daily with a meal. Rolan Ezra RAMAN, MD 07/08/2024 Active Self, Pharmacy Records           Med Note (CRUTHIS, CHLOE C   Wed Jul 11, 2024 10:50 AM) Pt is adamant she is still taking this medication at home. Dispense report does not support this  claim.   dapagliflozin  propanediol (FARXIGA ) 10 MG TABS tablet [id number] No TAKE 1 TABLET(10 MG) BY MOUTH DAILY BEFORE BREAKFAST Glena Harlene HERO, [place] 07/08/2024 Active Self, Pharmacy Records  furosemide  (LASIX ) 20 MG tablet [id number] No Take 2 tablets (40 mg total) by mouth daily.  Patient taking differently: Take 40 mg by mouth daily as needed for fluid or edema.   Glena Harlene Emerson, [place] 07/08/2024 Active Self, Pharmacy Records  potassium chloride  SA  (KLOR-CON  M) 20 MEQ tablet [id number] No Take 1 tablet (20 mEq total) by mouth daily. With Lasix   Patient taking differently: Take 20 mEq by mouth daily as needed (when taking lasix ).   [place], Harlene HERO, FNP Unknown Active Self, Pharmacy Records  rosuvastatin  (CRESTOR ) 40 MG tablet [id number] No Take 1 tablet (40 mg total) by mouth daily. Rolan Ezra RAMAN, MD 07/08/2024 Active Self, Pharmacy Records           Med Note (CRUTHIS, CHLOE C   Wed Jul 11, 2024 10:51 AM) Pt is adamant she is still taking this medication at home. Dispense report does not support this claim.   sacubitril -valsartan  (ENTRESTO ) 97-103 MG [id number] No TAKE 1 TABLET BY MOUTH TWICE DAILY McLean, Dalton S, MD 07/08/2024 Active Self, Pharmacy Records           Med Note (CRUTHIS, CHLOE C   Wed Jul 11, 2024 10:51 AM) Pt is adamant she is still taking this medication at home. Dispense report does not support this claim.   spironolactone  (ALDACTONE ) 25 MG tablet [id number] No Take 1 tablet (25 mg total) by mouth daily. Rolan Ezra RAMAN, MD 07/08/2024 Active Self, Pharmacy Records           Med Note (CRUTHIS, CHLOE C   Wed Jul 11, 2024 10:51 AM) Pt is adamant she is still taking this medication at home. Dispense report does not support this claim.             Home Care and Equipment/Supplies: Were Home Health Services Ordered?: No Any new equipment or medical supplies ordered?: No  Functional Questionnaire: Do you need assistance with bathing/showering or dressing?: No Do you need assistance with meal preparation?: No Do you need assistance with eating?: No Do you have difficulty maintaining continence: No Do you need assistance with getting out of bed/getting out of a chair/moving?: No Do you have difficulty managing or taking your medications?: No  Follow up appointments reviewed: PCP Follow-up appointment confirmed?: No MD Provider Line Number:[phone] Given: No (She said she will call [facility] when she is ready to schedule an  appointment,) Specialist Hospital Follow-up appointment confirmed?: Yes Date of Specialist follow-up appointment?: 08/31/24 Follow-Up Specialty Provider:: [facility] Do you need transportation to your follow-up appointment?: No Do you understand care options if your condition(s) worsen?: Yes-patient verbalized understanding    SIGNATURE Slater Diesel, RN   "

## 2024-07-16 NOTE — Telephone Encounter (Signed)
 Sheila Estes- please advise:   She stated she has a nebulizer but is in need of neb solution.  She explained that she has had asthma for years and she uses the albuterol  inhaler and nebulizer when needed to manage episodes of shortness of breath.  She also stated that she understands if the inhaler and nebulizer are not effective, she needs to go to the ED.   She stated she ran out of the nebulizer solution that she had for a few years.    I told her that I will notify her PCP. She said she spoke to cardiology about this and that provider referred her back to her PCP. She said she spoke to [place] about this and she told her that she would place a referral to pulmonary.

## 2024-07-23 ENCOUNTER — Other Ambulatory Visit (INDEPENDENT_AMBULATORY_CARE_PROVIDER_SITE_OTHER): Payer: Self-pay | Admitting: Primary Care

## 2024-07-23 DIAGNOSIS — J4541 Moderate persistent asthma with (acute) exacerbation: Secondary | ICD-10-CM

## 2024-07-23 MED ORDER — IPRATROPIUM-ALBUTEROL 0.5-2.5 (3) MG/3ML IN SOLN: 3.0000 mL | Freq: Four times a day (QID) | RESPIRATORY_TRACT | 1 refills | Status: AC | PRN

## 2024-08-31 ENCOUNTER — Ambulatory Visit (HOSPITAL_COMMUNITY)
